# Patient Record
Sex: Male | Born: 1940 | Race: White | Hispanic: No | Smoking: Never smoker
Health system: Southern US, Community
[De-identification: ages and names within clinical notes are randomized; demographics above are authoritative.]

## PROBLEM LIST (undated history)

## (undated) DIAGNOSIS — I10 Essential (primary) hypertension: Secondary | ICD-10-CM

## (undated) HISTORY — PX: PROSTATE SURGERY: SHX751

---

## 2000-12-11 ENCOUNTER — Encounter: Admission: RE | Admit: 2000-12-11 | Discharge: 2000-12-11 | Payer: Self-pay | Admitting: Family Medicine

## 2001-07-03 ENCOUNTER — Encounter: Admission: RE | Admit: 2001-07-03 | Discharge: 2001-07-03 | Payer: Self-pay | Admitting: Family Medicine

## 2008-08-25 ENCOUNTER — Ambulatory Visit (HOSPITAL_COMMUNITY): Admission: RE | Admit: 2008-08-25 | Discharge: 2008-08-25 | Payer: Self-pay | Admitting: Urology

## 2008-10-10 ENCOUNTER — Inpatient Hospital Stay (HOSPITAL_COMMUNITY): Admission: RE | Admit: 2008-10-10 | Discharge: 2008-10-11 | Payer: Self-pay | Admitting: Urology

## 2008-10-10 ENCOUNTER — Encounter (INDEPENDENT_AMBULATORY_CARE_PROVIDER_SITE_OTHER): Payer: Self-pay | Admitting: Urology

## 2011-04-02 NOTE — Discharge Summary (Signed)
NAME:  KIANDRE, SPAGNOLO NO.:  [id number]   MEDICAL RECORD NO.:  [id number]          PATIENT TYPE:  INP   LOCATION:  1426                         FACILITY:  [facility]   PHYSICIAN:  Valetta Fuller, M.D.  DATE OF BIRTH:  [birth year]   DATE OF ADMISSION:  10/10/2008  DATE OF DISCHARGE:  10/11/2008                               DISCHARGE SUMMARY   ADMISSION DIAGNOSIS:  Adenocarcinoma of the prostate.   DISCHARGE DIAGNOSIS:  Adenocarcinoma of the prostate.   PROCEDURES:  1. Robotic assisted laparoscopic radical prostatectomy.  2. Bilateral pelvic lymphadenopathy.   HISTORY OF PRESENT ILLNESS:  For full details, please see admission  history and physical.  Briefly, Mr. Colee is a 70 year old gentleman  who was found to have adenocarcinoma of the prostate.  After careful  consideration regarding management options for treatment, he elected to  proceed with surgical therapy and a robotic assisted laparoscopic  radical prostatectomy.   HOSPITAL COURSE:  On October 10, 2008, he was taken to the operating  room and underwent robotic assisted laparoscopic radical prostatectomy  which he tolerated well and without complications.  Postoperatively, he  was able to be transferred to a regular hospital room following recovery  from anesthesia.  He was able to begin ambulation that evening.  He  remained hemodynamically stable.  On the morning of postoperative day  #1, his hematocrit was 35.6.  Maintained excellent urine output with  minimal output from pelvic drain.  Therefore, his pelvic drain was  removed.  He was placed on a clear liquid diet and continued to  ambulate.  He was reevaluated on the afternoon of postoperative day #1,  his urine out put, blood pressure and pulse all remained stable.  He was  able to tolerate his clear liquid diet without nausea or vomiting.  He  was able to continue to ambulate without difficulty.  Therefore, he was  felt to be stable for  discharge and had met all discharge criteria.   DISPOSITION:  Home.   DISCHARGE MEDICATIONS:  He was instructed to resume his regular home  medications including Lipitor.  He was provided a prescription for  Vicodin to take as needed for pain and told to use Colace as a stool  softener.  He was given a prescription for Cipro to begin two days prior  to return visit, Foley catheter removal.   DISCHARGE INSTRUCTIONS:  He was instructed to be ambulatory but  specifically told to refrain from heavy lifting, strenuous activity or  driving.  He was instructed on routine Foley catheter care and told to  gradually advance his diet over the course of the next few days.   FOLLOWUP:  He will follow up in one week for Foley catheter removal and  to begin staple removal.      Delia Chimes, NP      Valetta Fuller, M.D.  Electronically Signed    MA/MEDQ  D:  10/11/2008  T:  10/11/2008  Job:  [id number]

## 2011-04-05 NOTE — Op Note (Signed)
NAME:  JOSIAH, NIETO NO.:  [id number]   MEDICAL RECORD NO.:  [id number]          PATIENT TYPE:  INP   LOCATION:  1426                         FACILITY:  [facility]   PHYSICIAN:  Valetta Fuller, M.D.  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   PROCEDURE PERFORMED:  Robotic assisted laparoscopic radical retropubic  prostatectomy with bilateral pelvic lymph node dissection.   SURGEON:  Valetta Fuller, M.D.   ASSISTANT:  Delia Chimes, NP.   ANESTHESIA:  General endotracheal.   INDICATIONS:  Mr. Canner is a 70 year old male.  The patient was  initially seen and evaluated by Dr. Larey Dresser.  The patient was  noted to have a PSA of approximately 11.  Rectal exam was unremarkable.  Ultrasound revealed a 47 gram prostate.  The patient's biopsies were  positive in all cores.  He had very high grade cancer with at least one  area showing a Gleason 4+5=9 tumor.  There was quite a bit of  variability with certain areas showing lower grade Gleason 6 cancer.  The patient did undergo metastatic evaluation including bone scan and CT  which failed to reveal gross disease outside his prostate.  He  understands he is at very high risk for microscopic disease outside the  confines of the prostate and/or microscopic metastatic disease.  The  patient underwent extensive discussion with regard to treatment options.  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  After decision was made to strongly consider  surgery, the patient was sent to consultation in our office where we had  an additional consultation with regard to the benefits and risks of  surgery.  Full informed  consent has been obtained.  He presents now for  the definitive procedure.  The patient has received perioperative  antibiotics and also had placement of compression boots for DVT  prophylaxis.   TECHNIQUE AND FINDINGS:  The patient was brought to the operating room  where he had successful induction of general endotracheal anesthesia.  He was placed in mid lithotomy position.  All extremities were carefully  padded.  He was secured to the operative table.  He was then placed in  steep Trendelenburg position and prepped and draped in the usual manner.  Foley catheter was placed sterilely on the field.  The camera port  incision was chosen 18 cm above the pubic symphysis just to the left of  the umbilicus.  Standard open Roseanne Reno technique was utilized with no  evidence of abdominal adhesions and abdominal insufflation occurred  without incident.  Additional trocars were all placed with direct visual  guidance including a 12 and 5 mm assist port and three 8-mm robotic  trocar ports.  Once all trocars were placed, the surgical cart was  docked.  The bladder was filled  to help identify it and the space of  Retzius was developed with sharp and blunt dissecting technique.  The  fat overlying the bladder neck and endopelvic fascia of the prostate was  removed to expose those structures.  Endopelvic fascia was incised from  base to the apex of the prostate and levator musculature was swept off  the apex of the prostate, isolating the dorsal venous complex.  Puboprostatic ligaments were taken down sharply.  The dorsal vein was  stapled with an ETS stapling device with good hemostasis.   Anterior bladder neck was identified with the aid of the Foley balloon.  This was then transected with hot electrocautery scissors.  Foley  catheter was identified the midline and then retracted anteriorly.  Indigo carmine was given and we were well away from the ureteral  orifices.  Posterior bladder neck was  then transected.  Individually we  were able to isolate the vas deferens on both sides as well as the  seminal vesicle and these structures were dissected free.  Once that was  completed, the rectal plane between the prostate and the rectum was  established primarily with blunt dissection.  No attempt was made to do  nerve sparing.  We took down the pedicles of the prostate with  hematoclips bilaterally and then tried to excise as much of the  neurovascular bundle to go with the specimen staying out as lateral as  possible with the clips.   The apex of the prostate was then carefully inspected.  Anterior urethra  was transected.  Foley catheter was identified and posterior urethra was  then transected.  There was some rectourethralis tissue that was  carefully gone through and then the prostate was freed and removed from  the pelvis.  Copious irrigation in the pelvis was performed.  Insufflation of the rectum revealed no evidence of rectal injury and  there was minimal to no significant blood loss at this point.   Attention was then turned towards pelvic lymph node dissection.  This  was extended to include the obturator packets up to the bifurcation of  the iliac artery.  Obturator nerves were identified bilaterally.  Small  clips were used for the veins and lymphatic channels and bilateral  packets were sent separately.   Attention was then turned to reconstruction.  A 2-0 Vicryl suture was  used at the 6 o'clock bladder neck and urethral position to  reapproximate those structures.  Once that was accomplished, we then  used a double-armed 3-0 Monocryl suture to perform a running 360 degrees  anastomosis.  A new coude Foley catheter was placed.  Bladder irrigated  blue dye and again the orifices had been identified with the indigo  carmine.  There was no evidence of leakage on bladder irrigation.  A  drain was placed through one of the robotic trocars and placed in the  retropubic  space and secured to the skin.  The prostate specimen was  placed in the Endopouch collection device.  The 12 mm trocar site was  closed with a suture passer utilizing Vicryl suture.  All other ports  were removed with direct visual guidance.  The camera port incision was  extended slightly to allow for removal of specimen and then closed with  a running #1 Vicryl suture.  All incisions were infiltrated with  Marcaine and closed with clips.  Total blood loss was estimated at 75  mL.  The patient had no other obvious complications or problems and was  brought to recovery room in stable condition.  +      Valetta Fuller, M.D.  Electronically Signed     DSG/MEDQ  D:  10/12/2008  T:  10/12/2008  Job:  [id number]

## 2011-08-20 LAB — BASIC METABOLIC PANEL
BUN: 14
CO2: 28
Chloride: 111
Creatinine, Ser: 0.95
GFR calc non Af Amer: >60
GFR calc non Af Amer: >60
Glucose, Bld: 127 — ABNORMAL HIGH
Potassium: 4.5
Sodium: 144

## 2011-08-20 LAB — DIFFERENTIAL
Eosinophils Relative: 0
Monocytes Relative: 7

## 2011-08-20 LAB — CBC
HCT: 35.6 — ABNORMAL LOW
MCHC: 33.4
Platelets: 152
RBC: 3.75 — ABNORMAL LOW

## 2011-08-20 LAB — HEMOGLOBIN AND HEMATOCRIT, BLOOD: Hemoglobin: 14.9

## 2011-08-20 LAB — TYPE AND SCREEN

## 2012-11-12 ENCOUNTER — Other Ambulatory Visit: Payer: Self-pay | Admitting: *Deleted

## 2012-11-12 DIAGNOSIS — R222 Localized swelling, mass and lump, trunk: Secondary | ICD-10-CM

## 2012-12-08 ENCOUNTER — Other Ambulatory Visit: Payer: Self-pay | Admitting: Dermatology

## 2019-01-12 ENCOUNTER — Ambulatory Visit
Admission: EM | Admit: 2019-01-12 | Discharge: 2019-01-12 | Disposition: A | Discharge status: Home / Self Care | Payer: Medicare Other

## 2019-01-12 DIAGNOSIS — J209 Acute bronchitis, unspecified: Secondary | ICD-10-CM

## 2019-01-12 MED ORDER — ONDANSETRON 4 MG PO TBDP: 4.0000 mg | ORAL_TABLET | Freq: Three times a day (TID) | ORAL | 0 refills | Status: DC | PRN

## 2019-01-12 MED ORDER — PREDNISONE 50 MG PO TABS: 50.0000 mg | ORAL_TABLET | Freq: Every day | ORAL | 0 refills | Status: DC

## 2019-01-12 NOTE — ED Provider Notes (Signed)
[facility]    CSN: [id number] Arrival date & time: 01/12/19  1121     History   Chief Complaint Chief Complaint  Patient presents with  . Cough    HPI Larry Olsen is a 78 y.o. male.   78 year old male comes in for 5 day history of URI symptoms. Has had cough, congestion, body aches. Denies fever, chills, night sweats. States nausea without vomiting. Patient has been taking otc cold medicine with some relief. Never smoker.   Patient did not want to answer additional questions, stating wants amoxicillin for his symptoms. Continued to say "whatever" when I stated will need to do an exam first.      History reviewed. No pertinent past medical history.  There are no active problems to display for this patient.   History reviewed. No pertinent surgical history.     Home Medications    Prior to Admission medications   Medication Sig Start Date End Date Taking? Authorizing Provider  atorvastatin (LIPITOR) 10 MG tablet Take 10 mg by mouth daily.   Yes [provider]  ondansetron (ZOFRAN ODT) 4 MG disintegrating tablet Take 1 tablet (4 mg total) by mouth every 8 (eight) hours as needed for nausea or vomiting. 01/12/19   Cathie Hoops, Layney Gillson V, PA-C  predniSONE (DELTASONE) 50 MG tablet Take 1 tablet (50 mg total) by mouth daily with breakfast. 01/12/19   Belinda Fisher, PA-C    Family History No family history on file.  Social History Social History   Tobacco Use  . Smoking status: Never Smoker  . Smokeless tobacco: Never Used  Substance Use Topics  . Alcohol use: Not Currently  . Drug use: Not on file     Allergies   Patient has no known allergies.   Review of Systems Review of Systems  Reason unable to perform ROS: See HPI as above.     Physical Exam Triage Vital Signs ED Triage Vitals [01/12/19 1243]  Enc Vitals Group     BP 138/76     Pulse Rate 87     Resp 18     Temp (!) 97.3 F (36.3 C)     Temp Source Oral     SpO2 96 %     Weight       Height      Head Circumference      Peak Flow      Pain Score 0     Pain Loc      Pain Edu?      Excl. in GC?    No data found.  Updated Vital Signs BP 138/76 (BP Location: Left Arm)   Pulse 87   Temp (!) 97.3 F (36.3 C) (Oral)   Resp 18   SpO2 96%   Physical Exam Constitutional:      General: He is not in acute distress.    Appearance: He is well-developed. He is not ill-appearing, toxic-appearing or diaphoretic.  HENT:     Head: Normocephalic and atraumatic.     Right Ear: Tympanic membrane, ear canal and external ear normal. Tympanic membrane is not erythematous or bulging.     Left Ear: Tympanic membrane, ear canal and external ear normal. Tympanic membrane is not erythematous or bulging.     Nose: Nose normal.     Right Sinus: No maxillary sinus tenderness or frontal sinus tenderness.     Left Sinus: No maxillary sinus tenderness or frontal sinus tenderness.  Mouth/Throat:     Mouth: Mucous membranes are moist.     Pharynx: Oropharynx is clear. Uvula midline.  Eyes:     Conjunctiva/sclera: Conjunctivae normal.     Pupils: Pupils are equal, round, and reactive to light.  Neck:     Musculoskeletal: Normal range of motion and neck supple.  Cardiovascular:     Rate and Rhythm: Normal rate and regular rhythm.     Heart sounds: Normal heart sounds. No murmur. No friction rub. No gallop.   Pulmonary:     Effort: Pulmonary effort is normal. No accessory muscle usage, prolonged expiration, respiratory distress or retractions.     Breath sounds: Normal breath sounds. No stridor, decreased air movement or transmitted upper airway sounds. No decreased breath sounds, wheezing, rhonchi or rales.  Skin:    General: Skin is warm and dry.  Neurological:     Mental Status: He is alert and oriented to person, place, and time.      UC Treatments / Results  Labs (all labs ordered are listed, but only abnormal results are displayed) Labs Reviewed - No data to  display  EKG None  Radiology No results found.  Procedures Procedures (including critical care time)  Medications Ordered in UC Medications - No data to display  Initial Impression / Assessment and Plan / UC Course  I have reviewed the triage vital signs and the nursing notes.  Pertinent labs & imaging results that were available during my care of the patient were reviewed by me and considered in my medical decision making (see chart for details).    Exam unremarkable. Stable vital signs. Lungs clear to auscultation bilaterally without adventitious lung sounds. No indications of antibiotics at this time. Offered prednisone to help with symptoms. Return precautions given.  Final Clinical Impressions(s) / UC Diagnoses   Final diagnoses:  Acute bronchitis, unspecified organism    ED Prescriptions    Medication Sig Dispense Auth. Provider   predniSONE (DELTASONE) 50 MG tablet Take 1 tablet (50 mg total) by mouth daily with breakfast. 5 tablet Nainika Newlun V, PA-C   ondansetron (ZOFRAN ODT) 4 MG disintegrating tablet Take 1 tablet (4 mg total) by mouth every 8 (eight) hours as needed for nausea or vomiting. 10 tablet Threasa Alpha, [place] 01/12/19 1559

## 2019-01-12 NOTE — Discharge Instructions (Signed)
Prednisone for bronchitis. Zofran as needed for nausea/vomiting. Keep hydrated, your urine should be clear to pale yellow in color. Tylenol/motrin for fever and pain. Monitor for any worsening of symptoms, chest pain, shortness of breath, wheezing, swelling of the throat, follow up for reevaluation.   For sore throat/cough try using a honey-based tea. Use 3 teaspoons of honey with juice squeezed from half lemon. Place shaved pieces of ginger into 1/2-1 cup of water and warm over stove top. Then mix the ingredients and repeat every 4 hours as needed.

## 2019-01-12 NOTE — ED Triage Notes (Signed)
Pt c/o cough, head/chest congestion, body aches since friday

## 2020-09-12 ENCOUNTER — Emergency Department (HOSPITAL_COMMUNITY): Payer: Medicare Other

## 2020-09-12 ENCOUNTER — Encounter (HOSPITAL_COMMUNITY): Payer: Self-pay | Admitting: Internal Medicine

## 2020-09-12 ENCOUNTER — Other Ambulatory Visit: Payer: Self-pay

## 2020-09-12 ENCOUNTER — Inpatient Hospital Stay (HOSPITAL_COMMUNITY)
Admission: EM | Admit: 2020-09-12 | Discharge: 2020-09-24 | DRG: 177 | Disposition: A | Discharge status: Home Health | Payer: Medicare Other | Attending: Internal Medicine | Admitting: Internal Medicine

## 2020-09-12 ENCOUNTER — Telehealth: Payer: Self-pay | Admitting: Nurse Practitioner

## 2020-09-12 DIAGNOSIS — J9601 Acute respiratory failure with hypoxia: Secondary | ICD-10-CM

## 2020-09-12 DIAGNOSIS — E871 Hypo-osmolality and hyponatremia: Secondary | ICD-10-CM

## 2020-09-12 DIAGNOSIS — I248 Other forms of acute ischemic heart disease: Secondary | ICD-10-CM

## 2020-09-12 DIAGNOSIS — Z7189 Other specified counseling: Secondary | ICD-10-CM | POA: Diagnosis not present

## 2020-09-12 DIAGNOSIS — D72828 Other elevated white blood cell count: Secondary | ICD-10-CM | POA: Diagnosis present

## 2020-09-12 DIAGNOSIS — I10 Essential (primary) hypertension: Secondary | ICD-10-CM | POA: Diagnosis present

## 2020-09-12 DIAGNOSIS — E785 Hyperlipidemia, unspecified: Secondary | ICD-10-CM | POA: Diagnosis present

## 2020-09-12 DIAGNOSIS — N179 Acute kidney failure, unspecified: Secondary | ICD-10-CM | POA: Diagnosis present

## 2020-09-12 DIAGNOSIS — Z66 Do not resuscitate: Secondary | ICD-10-CM | POA: Diagnosis not present

## 2020-09-12 DIAGNOSIS — R0602 Shortness of breath: Secondary | ICD-10-CM

## 2020-09-12 DIAGNOSIS — R197 Diarrhea, unspecified: Secondary | ICD-10-CM | POA: Diagnosis present

## 2020-09-12 DIAGNOSIS — U071 COVID-19: Principal | ICD-10-CM

## 2020-09-12 DIAGNOSIS — Z515 Encounter for palliative care: Secondary | ICD-10-CM | POA: Diagnosis not present

## 2020-09-12 DIAGNOSIS — R531 Weakness: Secondary | ICD-10-CM | POA: Diagnosis not present

## 2020-09-12 DIAGNOSIS — T380X5A Adverse effect of glucocorticoids and synthetic analogues, initial encounter: Secondary | ICD-10-CM | POA: Diagnosis present

## 2020-09-12 DIAGNOSIS — J1282 Pneumonia due to coronavirus disease 2019: Secondary | ICD-10-CM | POA: Diagnosis present

## 2020-09-12 DIAGNOSIS — R7989 Other specified abnormal findings of blood chemistry: Secondary | ICD-10-CM | POA: Diagnosis present

## 2020-09-12 DIAGNOSIS — J189 Pneumonia, unspecified organism: Secondary | ICD-10-CM

## 2020-09-12 DIAGNOSIS — I2489 Other forms of acute ischemic heart disease: Secondary | ICD-10-CM

## 2020-09-12 DIAGNOSIS — R54 Age-related physical debility: Secondary | ICD-10-CM | POA: Diagnosis present

## 2020-09-12 HISTORY — DX: Essential (primary) hypertension: I10

## 2020-09-12 LAB — TRIGLYCERIDES: Triglycerides: 58 mg/dL (ref <150)

## 2020-09-12 LAB — CBC WITH DIFFERENTIAL/PLATELET
Abs Immature Granulocytes: 0.09 10*3/uL — ABNORMAL HIGH (ref 0.00–0.07)
Basophils Absolute: 0 10*3/uL (ref 0.0–0.1)
Basophils Relative: 0 %
Eosinophils Absolute: 0 10*3/uL (ref 0.0–0.5)
Eosinophils Relative: 0 %
HCT: 33.8 % — ABNORMAL LOW (ref 39.0–52.0)
Hemoglobin: 12 g/dL — ABNORMAL LOW (ref 13.0–17.0)
Immature Granulocytes: 1 %
Lymphocytes Relative: 1 %
Lymphs Abs: 0.2 10*3/uL — ABNORMAL LOW (ref 0.7–4.0)
MCH: 31.5 pg (ref 26.0–34.0)
MCHC: 35.5 g/dL (ref 30.0–36.0)
MCV: 88.7 fL (ref 80.0–100.0)
Monocytes Absolute: 0.7 10*3/uL (ref 0.1–1.0)
Monocytes Relative: 5 %
Neutro Abs: 14.4 10*3/uL — ABNORMAL HIGH (ref 1.7–7.7)
Neutrophils Relative %: 93 %
Platelets: 207 10*3/uL (ref 150–400)
RBC: 3.81 MIL/uL — ABNORMAL LOW (ref 4.22–5.81)
RDW: 12.6 % (ref 11.5–15.5)
WBC: 15.3 10*3/uL — ABNORMAL HIGH (ref 4.0–10.5)
nRBC: 0 % (ref 0.0–0.2)

## 2020-09-12 LAB — TROPONIN I (HIGH SENSITIVITY)
Troponin I (High Sensitivity): 352 ng/L (ref <18)
Troponin I (High Sensitivity): 531 ng/L (ref <18)
Troponin I (High Sensitivity): 542 ng/L (ref <18)

## 2020-09-12 LAB — RESPIRATORY PANEL BY RT PCR (FLU A&B, COVID)
Influenza A by PCR: NEGATIVE
Influenza B by PCR: NEGATIVE
SARS Coronavirus 2 by RT PCR: POSITIVE — AB

## 2020-09-12 LAB — BASIC METABOLIC PANEL
Anion gap: 10 (ref 5–15)
BUN: 14 mg/dL (ref 8–23)
CO2: 20 mmol/L — ABNORMAL LOW (ref 22–32)
Calcium: 8.2 mg/dL — ABNORMAL LOW (ref 8.9–10.3)
Chloride: 91 mmol/L — ABNORMAL LOW (ref 98–111)
Creatinine, Ser: 1.01 mg/dL (ref 0.61–1.24)
GFR, Estimated: >60 mL/min (ref >60)
Glucose, Bld: 195 mg/dL — ABNORMAL HIGH (ref 70–99)
Potassium: 4.7 mmol/L (ref 3.5–5.1)
Sodium: 121 mmol/L — ABNORMAL LOW (ref 135–145)

## 2020-09-12 LAB — COMPREHENSIVE METABOLIC PANEL
ALT: 52 U/L — ABNORMAL HIGH (ref 0–44)
AST: 87 U/L — ABNORMAL HIGH (ref 15–41)
Albumin: 2.7 g/dL — ABNORMAL LOW (ref 3.5–5.0)
Alkaline Phosphatase: 83 U/L (ref 38–126)
Anion gap: 11 (ref 5–15)
BUN: 17 mg/dL (ref 8–23)
CO2: 20 mmol/L — ABNORMAL LOW (ref 22–32)
Calcium: 8.2 mg/dL — ABNORMAL LOW (ref 8.9–10.3)
Chloride: 89 mmol/L — ABNORMAL LOW (ref 98–111)
Creatinine, Ser: 1.47 mg/dL — ABNORMAL HIGH (ref 0.61–1.24)
GFR, Estimated: 48 mL/min — ABNORMAL LOW (ref >60)
Glucose, Bld: 186 mg/dL — ABNORMAL HIGH (ref 70–99)
Potassium: 4.4 mmol/L (ref 3.5–5.1)
Sodium: 120 mmol/L — ABNORMAL LOW (ref 135–145)
Total Bilirubin: 1.7 mg/dL — ABNORMAL HIGH (ref 0.3–1.2)
Total Protein: 5.8 g/dL — ABNORMAL LOW (ref 6.5–8.1)

## 2020-09-12 LAB — C-REACTIVE PROTEIN: CRP: 15.5 mg/dL — ABNORMAL HIGH (ref <1.0)

## 2020-09-12 LAB — LACTIC ACID, PLASMA
Lactic Acid, Venous: 2.5 mmol/L (ref 0.5–1.9)
Lactic Acid, Venous: 2.5 mmol/L (ref 0.5–1.9)

## 2020-09-12 LAB — FERRITIN: Ferritin: 806 ng/mL — ABNORMAL HIGH (ref 24–336)

## 2020-09-12 LAB — OSMOLALITY: Osmolality: 262 mOsm/kg — ABNORMAL LOW (ref 275–295)

## 2020-09-12 LAB — LACTATE DEHYDROGENASE: LDH: 413 U/L — ABNORMAL HIGH (ref 98–192)

## 2020-09-12 LAB — PROCALCITONIN: Procalcitonin: 0.72 ng/mL

## 2020-09-12 LAB — D-DIMER, QUANTITATIVE: D-Dimer, Quant: 4.45 ug/mL-FEU — ABNORMAL HIGH (ref 0.00–0.50)

## 2020-09-12 LAB — FIBRINOGEN: Fibrinogen: 692 mg/dL — ABNORMAL HIGH (ref 210–475)

## 2020-09-12 LAB — BRAIN NATRIURETIC PEPTIDE: B Natriuretic Peptide: 240.6 pg/mL — ABNORMAL HIGH (ref 0.0–100.0)

## 2020-09-12 MED ORDER — DEXAMETHASONE SODIUM PHOSPHATE 10 MG/ML IJ SOLN: 6.0000 mg | INTRAMUSCULAR | Status: DC

## 2020-09-12 MED ORDER — SODIUM CHLORIDE 0.9 % IV SOLN
200.0000 mg | Freq: Once | INTRAVENOUS | Status: AC
  Administered 2020-09-12: 200 mg via INTRAVENOUS
  Filled 2020-09-12: qty 40

## 2020-09-12 MED ORDER — HYDROCOD POLST-CPM POLST ER 10-8 MG/5ML PO SUER: 5.0000 mL | Freq: Two times a day (BID) | ORAL | Status: DC | PRN

## 2020-09-12 MED ORDER — POLYETHYLENE GLYCOL 3350 17 G PO PACK: 17.0000 g | PACK | Freq: Every day | ORAL | Status: DC | PRN

## 2020-09-12 MED ORDER — TOCILIZUMAB 400 MG/20ML IV SOLN
525.0000 mg | Freq: Once | INTRAVENOUS | Status: AC
  Administered 2020-09-12: 525 mg via INTRAVENOUS
  Filled 2020-09-12: qty 26.25

## 2020-09-12 MED ORDER — SODIUM CHLORIDE 0.9% FLUSH
3.0000 mL | Freq: Two times a day (BID) | INTRAVENOUS | Status: DC
  Administered 2020-09-13 – 2020-09-24 (×23): 3 mL via INTRAVENOUS

## 2020-09-12 MED ORDER — SODIUM CHLORIDE 0.9 % IV SOLN
100.0000 mg | Freq: Every day | INTRAVENOUS | Status: AC
  Administered 2020-09-13 – 2020-09-16 (×4): 100 mg via INTRAVENOUS
  Filled 2020-09-12 (×5): qty 20

## 2020-09-12 MED ORDER — SODIUM CHLORIDE 0.9 % IV SOLN: INTRAVENOUS | Status: AC

## 2020-09-12 MED ORDER — DEXAMETHASONE SODIUM PHOSPHATE 10 MG/ML IJ SOLN
10.0000 mg | Freq: Once | INTRAMUSCULAR | Status: AC
  Administered 2020-09-12: 10 mg via INTRAVENOUS
  Filled 2020-09-12: qty 1

## 2020-09-12 MED ORDER — SODIUM CHLORIDE 0.9 % IV BOLUS
500.0000 mL | Freq: Once | INTRAVENOUS | Status: AC
  Administered 2020-09-12: 500 mL via INTRAVENOUS

## 2020-09-12 MED ORDER — ATORVASTATIN CALCIUM 10 MG PO TABS
10.0000 mg | ORAL_TABLET | Freq: Every day | ORAL | Status: DC
  Administered 2020-09-13 – 2020-09-24 (×12): 10 mg via ORAL
  Filled 2020-09-12 (×12): qty 1

## 2020-09-12 MED ORDER — ENOXAPARIN SODIUM 40 MG/0.4ML ~~LOC~~ SOLN
40.0000 mg | SUBCUTANEOUS | Status: DC
  Administered 2020-09-12: 40 mg via SUBCUTANEOUS
  Filled 2020-09-12: qty 0.4

## 2020-09-12 MED ORDER — GUAIFENESIN-DM 100-10 MG/5ML PO SYRP: 10.0000 mL | ORAL_SOLUTION | ORAL | Status: DC | PRN

## 2020-09-12 MED ORDER — ACETAMINOPHEN 325 MG PO TABS: 650.0000 mg | ORAL_TABLET | Freq: Four times a day (QID) | ORAL | Status: DC | PRN

## 2020-09-12 MED ORDER — IOHEXOL 350 MG/ML SOLN
75.0000 mL | Freq: Once | INTRAVENOUS | Status: AC | PRN
  Administered 2020-09-12: 75 mL via INTRAVENOUS

## 2020-09-12 NOTE — Progress Notes (Signed)
RT placed patient on heated HFNC per MD verbal order.  Patient on 25L/100% Fio2. Patient is tolerating settings well at this time. HR 101, RR 32, SPO2 91%.  Nonrebreather is hanging on O2 flowmeter at bedside if needed. RT will continue to monitor as needed.

## 2020-09-12 NOTE — Progress Notes (Signed)
Na return at 121, only increased by 1. Will start NS at 100cc/hr with goal of </=128 in 24 hours from initial sodium. Continue to follow BMP.

## 2020-09-12 NOTE — ED Triage Notes (Signed)
Have been sick the last 10 days. Tested +COVID Monday. SOB got worst today with SpO2 68% on ra and tachypneic up to 50 respiration per minute,Pt does not use oxygen at home. Other symptoms includes lethargy, cough and diarrhea. Finished Z pack this morning.

## 2020-09-12 NOTE — H&P (Signed)
History and Physical   Larry MontgomeryJerry T Olsen ZOX:[id number] DOB: 07-02-1941 DOA: 09/12/2020  PCP: Patient, No Pcp Per   Patient coming from: Home  Chief Complaint: Dyspnea  HPI: Larry MontgomeryJerry T Olsen is a 79 y.o. male with medical history significant of hypertension, hyperlipidemia who presents with progressive dyspnea in the setting of known Covid positive.  He began to experience symptoms of a sinus infection about 2 weeks ago.  He contacted his PCP who prescribed him a short course of antibiotics and he recommended that he get a Covid test.  He did a home test which was positive.  That Monday he went in to see his PCP and he was noted to have a positive test in the clinic as well.  He continued to have progressively worsening dyspnea and fatigue.  He was looking into getting a monoclonal antibody infusion outpatient.  He was contacted by a provider about his eligibility for the infusion over the phone today who noted that he was profoundly dyspneic and tachypneic over the phone and patient was advised to come straight to the ED via EMS.  He reports fever, cough, shortness of breath, diarrhea.  He denies chest pain, abdominal pain.  He has not received a COVID-19 vaccination.  ED Course: In the ED patient was initially requiring 14 L by nonrebreather to maintain saturations greater than 94%, this progressed to requiring 25 L of high flow.  He has been mildly tachycardic in the low 100s and tachypneic in the 30s to 40s.  Labs were significant for sodium 120, creatinine 1.4 elevated from baseline of less than 1.  Mild LFT elevation with AST 87 ALT 57.  Hemoglobin stable at 12, WBC 15.  Troponin initially 352, 531 on repeat.  BNP of 240.  Lactic acid 2.5 and 2.5 again on repeat.  D-dimer 4.4, procalcitonin 10.72, LDH 413, TG 58, fibrinogen 692, CRP pending, ferritin pending, blood cultures pending.  Respiratory panel again positive for Covid.  Patient received a 100 cc bolus, remdesivir, Decadron in the ED.  Review  of Systems: As per HPI otherwise all other systems reviewed and are negative.  Past Medical History:  Diagnosis Date  . HTN (hypertension)     Past Surgical History:  Procedure Laterality Date  . PROSTATE SURGERY      Social History  reports that he has never smoked. He has never used smokeless tobacco. He reports previous alcohol use. No history on file for drug use.  No Known Allergies  History reviewed. No pertinent family history. Reviewed on admission  Prior to Admission medications   Medication Sig Start Date End Date Taking? Authorizing Provider  atorvastatin (LIPITOR) 10 MG tablet Take 10 mg by mouth daily.    [provider]  ondansetron (ZOFRAN ODT) 4 MG disintegrating tablet Take 1 tablet (4 mg total) by mouth every 8 (eight) hours as needed for nausea or vomiting. 01/12/19   Cathie HoopsYu, Amy V, PA-C  predniSONE (DELTASONE) 50 MG tablet Take 1 tablet (50 mg total) by mouth daily with breakfast. 01/12/19   Belinda FisherYu, Amy V, PA-C    Physical Exam: Vitals:   09/12/20 1515 09/12/20 1530 09/12/20 1945 09/12/20 2000  BP: 134/77 138/72 (!) 157/85 (!) 157/85  Pulse: (!) 101 (!) 102 (!) 101 (!) 101  Resp: (!) 50 (!) 36 (!) 38 (!) 32  Temp:      TempSrc:      SpO2: 94% 95% 92% 91%  Weight:      Height:  Physical Exam Constitutional:      Appearance: Normal appearance.     Comments: Moderate distress  HENT:     Head: Normocephalic and atraumatic.     Mouth/Throat:     Mouth: Mucous membranes are moist.     Pharynx: Oropharynx is clear.  Eyes:     Extraocular Movements: Extraocular movements intact.     Pupils: Pupils are equal, round, and reactive to light.  Cardiovascular:     Rate and Rhythm: Regular rhythm. Tachycardia present.     Pulses: Normal pulses.     Heart sounds: Normal heart sounds.  Pulmonary:     Effort: Respiratory distress (moderate) present.     Breath sounds: Rales (Diffuse) present.     Comments: Tachypnea Abdominal:     General: Bowel  sounds are normal. There is no distension.     Palpations: Abdomen is soft.     Tenderness: There is no abdominal tenderness.  Musculoskeletal:        General: No swelling or deformity.  Skin:    General: Skin is warm and dry.  Neurological:     General: No focal deficit present.     Mental Status: Mental status is at baseline.    Labs on Admission: I have personally reviewed following labs and imaging studies  CBC: Recent Labs  Lab 09/12/20 1341  WBC 15.3*  NEUTROABS 14.4*  HGB 12.0*  HCT 33.8*  MCV 88.7  PLT 207   Basic Metabolic Panel: Recent Labs  Lab 09/12/20 1341  NA 120*  K 4.4  CL 89*  CO2 20*  GLUCOSE 186*  BUN 17  CREATININE 1.47*  CALCIUM 8.2*   GFR: Estimated Creatinine Clearance: 34.3 mL/min (A) (by C-G formula based on SCr of 1.47 mg/dL (H)).  Liver Function Tests: Recent Labs  Lab 09/12/20 1341  AST 87*  ALT 52*  ALKPHOS 83  BILITOT 1.7*  PROT 5.8*  ALBUMIN 2.7*    Urine analysis: No results found for: COLORURINE, APPEARANCEUR, LABSPEC, PHURINE, GLUCOSEU, HGBUR, BILIRUBINUR, KETONESUR, PROTEINUR, UROBILINOGEN, NITRITE, LEUKOCYTESUR  Radiological Exams on Admission: CT Angio Chest PE W and/or Wo Contrast  Result Date: 09/12/2020 CLINICAL DATA:  79 year old male with elevated D-dimer. Concern for pulmonary embolism. EXAM: CT ANGIOGRAPHY CHEST WITH CONTRAST TECHNIQUE: Multidetector CT imaging of the chest was performed using the standard protocol during bolus administration of intravenous contrast. Multiplanar CT image reconstructions and MIPs were obtained to evaluate the vascular anatomy. CONTRAST:  37mL OMNIPAQUE IOHEXOL 350 MG/ML SOLN COMPARISON:  Chest radiograph dated 09/12/2020. FINDINGS: Evaluation of this exam is limited due to respiratory motion artifact. Cardiovascular: There is no cardiomegaly or pericardial effusion. There is 3 vessel coronary vascular calcification. Moderate atherosclerotic calcification of the thoracic aorta. No  aneurysmal dilatation or dissection. The origins of the great vessels of the aortic arch appear patent as visualized. Evaluation of the pulmonary arteries is limited due to respiratory motion artifact as well as streak artifact caused by patient's arms. No large or central pulmonary artery embolus identified. Mediastinum/Nodes: No hilar or mediastinal adenopathy. The esophagus and the thyroid gland are grossly unremarkable. No mediastinal fluid collection. Lungs/Pleura: Extensive patchy bilateral pulmonary opacities with predominant involvement of the lower lobes most consistent with multifocal pneumonia, likely viral or atypical in etiology including COVID-19. Clinical correlation is recommended. There are small bilateral pleural effusions. No pneumothorax. The central airways are patent. Upper Abdomen: No acute abnormality. Musculoskeletal: Degenerative changes of the spine. No acute osseous pathology. Review of the MIP images confirms  the above findings. IMPRESSION: 1. No CT evidence of central pulmonary artery embolus. 2. Multifocal pneumonia, likely viral or atypical in etiology. Clinical correlation and follow-up to resolution recommended. 3. Small bilateral pleural effusions. 4. Aortic Atherosclerosis (ICD10-I70.0). Electronically Signed   By: Elgie Collard M.D.   On: 09/12/2020 19:05   DG Chest Port 1 View  Result Date: 09/12/2020 CLINICAL DATA:  Shortness of breath, COVID-19 positive EXAM: PORTABLE CHEST 1 VIEW COMPARISON:  10/07/2008 FINDINGS: Heart size within normal limits. Aortic atherosclerosis. There are diffuse interstitial opacities throughout the left lung, most confluent within the left lung base. Mild interstitial prominence within the right lung. No large pleural fluid collection. No pneumothorax. IMPRESSION: Diffuse interstitial opacities throughout the left lung, most confluent within the left lung base. Findings concerning for multifocal atypical/viral infection versus asymmetric  edema. Electronically Signed   By: Duanne Guess D.O.   On: 09/12/2020 14:59    EKG: Independently reviewed.  Sinus tachycardia.   Assessment/Plan Principal Problem:   Pneumonia due to COVID-19 virus Active Problems:   Hyponatremia   ARF (acute renal failure) (HCC)   Demand ischemia (HCC)  Pneumonia 2/2 Covid-19 > Known positive for about 2 weeks, progressive symptoms, now requiring 25 L of high flow at 100%. > Likely myocardial injury, with elevated troponin, as below - Admit to progressive - Continue high flow - Continue remdesivir and Decadron - Start Tocilizumab, given already increasing oxygen requirements from 15 L nonrebreather to 25 L high flow - Evaluate for proning - Mucinex PRN - Daily CBC, CMP, CRP, D-dimer, ferritin, magnesium, phosphorus  AKI Hyponatremia > Sodium 120 in ED, received 500 cc bolus > Creatinine mildly elevated to 1.47 - Recheck BMP, determine fluid rate based on results of this - Serum osm, urine osm, ur na - Trend BMP  Troponinemia > Demand ischemia suspected, myocardial injury common in hospitalized COVID-19 patients - Trend troponin to peak  Hypertension > Reports taking blood pressure medicine that starts with an "A", but is unsure what it is - Will hold off on antihypertensive for now (likely Amlodipine)  HLD - Continue atorvastatin  DVT prophylaxis: Lovenox  Code Status:   Full  Family Communication:  Attempted to contact, Daughter,  Maureen Ralphs by phone at [phone]; but number was disconnected. No answer at his phone number either.   Disposition Plan:   Patient is from:  Home  Anticipated DC to:  Pending clinical course  Anticipated DC date:  Pending clinical course  Consults called:  None  Admission status:  Inpatient, progressive   Severity of Illness: The appropriate patient status for this patient is INPATIENT. Inpatient status is judged to be reasonable and necessary in order to provide the required intensity of  service to ensure the patient's safety. The patient's presenting symptoms, physical exam findings, and initial radiographic and laboratory data in the context of their chronic comorbidities is felt to place them at high risk for further clinical deterioration. Furthermore, it is not anticipated that the patient will be medically stable for discharge from the hospital within 2 midnights of admission. The following factors support the patient status of inpatient.   " The patient's presenting symptoms include fatigue, dyspnea. " The worrisome physical exam findings include tachycardia, tachypnea. " The initial radiographic and laboratory data are worrisome because of multifocal pneumonia in the setting of COVID-19 infection, hyponatremia. " The chronic co-morbidities include hypertension, hyperlipidemia.   * I certify that at the point of admission it is my clinical judgment that the  patient will require inpatient hospital care spanning beyond 2 midnights from the point of admission due to high intensity of service, high risk for further deterioration and high frequency of surveillance required.Synetta Fail MD [facility]  How to contact the [facility] Attending or Consulting provider 7A - 7P or covering provider during after hours 7P -7A, for this patient?   1. Check the care team in [facility] and look for a) attending/consulting [facility] provider listed and b) the [facility] team listed 2. Log into [email or web address] and use [place]'s universal password to access. If you do not have the password, please contact the hospital operator. 3. Locate the [facility] provider you are looking for under [facility] and page to a number that you can be directly reached. 4. If you still have difficulty reaching the provider, please page the [facility] (Director on Call) for the Hospitalists listed on amion for assistance.  09/12/2020, 9:14 PM

## 2020-09-12 NOTE — Telephone Encounter (Signed)
I called Larry Olsen to discuss Covid symptoms and the use of casirivimab/imdevimab, a monoclonal antibody infusion for those with mild to moderate Covid symptoms and at a high risk of hospitalization.    Pt does not qualify for infusion therapy as pt's symptoms first presented > 10 days prior to timing of infusion. Pt is profoundly dyspneic with speech.  Tachypnea noted during phone call.  I advised pt that he sounds to have severe illness and should hang up and immediately call 911.  Patient verbalized understanding  Nicolasa Ducking, NP

## 2020-09-12 NOTE — ED Provider Notes (Signed)
  Physical Exam  BP 138/72   Pulse (!) 102   Temp 100 F (37.8 C) (Oral)   Resp (!) 36   Ht 5\' 5"  (1.651 m)   Wt 65.8 kg   SpO2 95%   BMI 24.13 kg/m   Physical Exam  ED Course/Procedures     Procedures  MDM    Received care of patient from Dr. at 4 PM.  Please see his note for prior history, physical and care.  Briefly this 79 year old male who presents with COVID-19 infection with hypoxia down to the 60s, labs significant for acute kidney injury, troponin elevation and hyponatremia.  CT PE study pending.   CT PE study shows no evidence of pulmonary embolus, does show findings consistent with COVID-19 infection.  Dr. 94 had spoken to cardiology previously about troponin elevation.  We will transition patient to high flow nasal cannula from nonrebreather.  Admit to the hospital for further care.  He has received Decadron.    Stevie Kern, MD 09/13/20 1147

## 2020-09-12 NOTE — ED Provider Notes (Signed)
[facility] EMERGENCY DEPARTMENT Provider Note   CSN: [id number] Arrival date & time: 09/12/20  1336     History Chief Complaint  Patient presents with  . Shortness of Breath  . Covid Positive    Larry Olsen is a 79 y.o. male.  Presents to ER with concern for shortness of breath, positive Covid test.  Patient reports he has been having symptoms for the last couple weeks.  Had at home test that was positive, been test in his primary clinic that was positive.  Reports over the last few days he has had significant worsening in his breathing, feels very short of breath today.  Completed Z-Pak.  No improvement in symptoms.  No chest pain currently.  Some low-grade fevers, chills, significant decrease in energy.  HPI     No past medical history on file.  There are no problems to display for this patient.   No past surgical history on file.     No family history on file.  Social History   Tobacco Use  . Smoking status: Never Smoker  . Smokeless tobacco: Never Used  Substance Use Topics  . Alcohol use: Not Currently  . Drug use: Not on file    Home Medications Prior to Admission medications   Medication Sig Start Date End Date Taking? Authorizing Provider  atorvastatin (LIPITOR) 10 MG tablet Take 10 mg by mouth daily.    [provider]  ondansetron (ZOFRAN ODT) 4 MG disintegrating tablet Take 1 tablet (4 mg total) by mouth every 8 (eight) hours as needed for nausea or vomiting. 01/12/19   Cathie Hoops, Amy V, PA-C  predniSONE (DELTASONE) 50 MG tablet Take 1 tablet (50 mg total) by mouth daily with breakfast. 01/12/19   Belinda Fisher, PA-C    Allergies    Patient has no known allergies.  Review of Systems   Review of Systems  Constitutional: Positive for chills, fatigue and fever.  HENT: Negative for ear pain and sore throat.   Eyes: Negative for pain and visual disturbance.  Respiratory: Positive for shortness of breath. Negative for cough.     Cardiovascular: Negative for chest pain and palpitations.  Gastrointestinal: Negative for abdominal pain and vomiting.  Genitourinary: Negative for dysuria and hematuria.  Musculoskeletal: Negative for arthralgias and back pain.  Skin: Negative for color change and rash.  Neurological: Negative for seizures and syncope.  All other systems reviewed and are negative.   Physical Exam Updated Vital Signs BP 138/72   Pulse (!) 102   Temp 100 F (37.8 C) (Oral)   Resp (!) 36   Ht 5\' 5"  (1.651 m)   Wt 65.8 kg   SpO2 95%   BMI 24.13 kg/m   Physical Exam Vitals and nursing note reviewed.  Constitutional:      Appearance: He is well-developed.  HENT:     Head: Normocephalic and atraumatic.  Eyes:     Conjunctiva/sclera: Conjunctivae normal.  Cardiovascular:     Rate and Rhythm: Normal rate and regular rhythm.     Heart sounds: No murmur heard.   Pulmonary:     Comments: Mild tachypnea, speaking in near full sentences but slightly reduced, bilateral crackles noted, 15 L NRB Abdominal:     Palpations: Abdomen is soft.     Tenderness: There is no abdominal tenderness.  Musculoskeletal:     Cervical back: Neck supple.     Right lower leg: No edema.     Left lower leg: No  edema.  Skin:    General: Skin is warm and dry.  Neurological:     General: No focal deficit present.     Mental Status: He is alert.  Psychiatric:        Mood and Affect: Mood normal.        Behavior: Behavior normal.     ED Results / Procedures / Treatments   Labs (all labs ordered are listed, but only abnormal results are displayed) Labs Reviewed  LACTIC ACID, PLASMA - Abnormal; Notable for the following components:      Result Value   Lactic Acid, Venous 2.5 (*)    All other components within normal limits  LACTIC ACID, PLASMA - Abnormal; Notable for the following components:   Lactic Acid, Venous 2.5 (*)    All other components within normal limits  CBC WITH DIFFERENTIAL/PLATELET - Abnormal;  Notable for the following components:   WBC 15.3 (*)    RBC 3.81 (*)    Hemoglobin 12.0 (*)    HCT 33.8 (*)    Neutro Abs 14.4 (*)    Lymphs Abs 0.2 (*)    Abs Immature Granulocytes 0.09 (*)    All other components within normal limits  COMPREHENSIVE METABOLIC PANEL - Abnormal; Notable for the following components:   Sodium 120 (*)    Chloride 89 (*)    CO2 20 (*)    Glucose, Bld 186 (*)    Creatinine, Ser 1.47 (*)    Calcium 8.2 (*)    Total Protein 5.8 (*)    Albumin 2.7 (*)    AST 87 (*)    ALT 52 (*)    Total Bilirubin 1.7 (*)    GFR, Estimated 48 (*)    All other components within normal limits  D-DIMER, QUANTITATIVE (NOT AT [facility]) - Abnormal; Notable for the following components:   D-Dimer, Quant 4.45 (*)    All other components within normal limits  LACTATE DEHYDROGENASE - Abnormal; Notable for the following components:   LDH 413 (*)    All other components within normal limits  FIBRINOGEN - Abnormal; Notable for the following components:   Fibrinogen 692 (*)    All other components within normal limits  BRAIN NATRIURETIC PEPTIDE - Abnormal; Notable for the following components:   B Natriuretic Peptide 240.6 (*)    All other components within normal limits  TROPONIN I (HIGH SENSITIVITY) - Abnormal; Notable for the following components:   Troponin I (High Sensitivity) 352 (*)    All other components within normal limits  TROPONIN I (HIGH SENSITIVITY) - Abnormal; Notable for the following components:   Troponin I (High Sensitivity) 531 (*)    All other components within normal limits  RESPIRATORY PANEL BY RT PCR (FLU A&B, COVID)  CULTURE, BLOOD (ROUTINE X 2)  CULTURE, BLOOD (ROUTINE X 2)  PROCALCITONIN  TRIGLYCERIDES  FERRITIN  C-REACTIVE PROTEIN    EKG EKG Interpretation  Date/Time:  Tuesday September 12 2020 13:48:23 EDT Ventricular Rate:  111 PR Interval:    QRS Duration: 81 QT Interval:  321 QTC Calculation: 437 R Axis:   49 Text Interpretation: Sinus  tachycardia Probable left atrial enlargement Confirmed by Marianna Fuss ([id number]) on 09/12/2020 2:21:46 PM   Radiology DG Chest Port 1 View  Result Date: 09/12/2020 CLINICAL DATA:  Shortness of breath, COVID-19 positive EXAM: PORTABLE CHEST 1 VIEW COMPARISON:  10/07/2008 FINDINGS: Heart size within normal limits. Aortic atherosclerosis. There are diffuse interstitial opacities throughout the left lung, most confluent within  the left lung base. Mild interstitial prominence within the right lung. No large pleural fluid collection. No pneumothorax. IMPRESSION: Diffuse interstitial opacities throughout the left lung, most confluent within the left lung base. Findings concerning for multifocal atypical/viral infection versus asymmetric edema. Electronically Signed   By: Duanne Guess D.O.   On: 09/12/2020 14:59    Procedures .Critical Care Performed by: Milagros Loll, MD Authorized by: Milagros Loll, MD   Critical care provider statement:    Critical care time (minutes):  46   Critical care was necessary to treat or prevent imminent or life-threatening deterioration of the following conditions:  Respiratory failure   Critical care was time spent personally by me on the following activities:  Discussions with consultants, evaluation of patient's response to treatment, examination of patient, ordering and performing treatments and interventions, ordering and review of laboratory studies, ordering and review of radiographic studies, pulse oximetry, re-evaluation of patient's condition, obtaining history from patient or surrogate and review of old charts   (including critical care time)  Medications Ordered in ED Medications  remdesivir 200 mg in sodium chloride 0.9% 250 mL IVPB (has no administration in time range)    Followed by  remdesivir 100 mg in sodium chloride 0.9 % 100 mL IVPB (has no administration in time range)  dexamethasone (DECADRON) injection 10 mg (10 mg Intravenous  Given 09/12/20 1518)  sodium chloride 0.9 % bolus 500 mL (500 mLs Intravenous New Bag/Given 09/12/20 1633)    ED Course  I have reviewed the triage vital signs and the nursing notes.  Pertinent labs & imaging results that were available during my care of the patient were reviewed by me and considered in my medical decision making (see chart for details).    MDM Rules/Calculators/A&P                         79 year old male presenting to ER with concern for shortness of breath in setting of known COVID-19.  Reportedly profoundly hypoxic prior to arrival.  Currently oxygenation doing okay on 15 L nonrebreather.  Lab work notable for hyponatremia, elevated troponin, elevated D-dimer.  EKG without clear ischemic changes.  Discussed with cardiology, agreed with obtaining CTA chest to rule out PE, does not need formal consult unless desired by admitting hospitalist, likely troponin secondary to respiratory failure.  Provided patient with some fluids for dehydration, hyponatremia.  Will defer further hyponatremia management to admitting team.  Placed orders for Decadron, remdesivir.  While awaiting results of CTA, status Dr. Dalene Seltzer.  Anticipate medicine admission.  Final Clinical Impression(s) / ED Diagnoses Final diagnoses:  COVID-19  Acute respiratory failure with hypoxia (HCC)  Multifocal pneumonia    Rx / DC Orders ED Discharge Orders    None       Milagros Loll, MD 09/12/20 1655

## 2020-09-13 ENCOUNTER — Inpatient Hospital Stay (HOSPITAL_COMMUNITY): Payer: Medicare Other

## 2020-09-13 DIAGNOSIS — J9601 Acute respiratory failure with hypoxia: Secondary | ICD-10-CM

## 2020-09-13 DIAGNOSIS — U071 COVID-19: Secondary | ICD-10-CM

## 2020-09-13 DIAGNOSIS — R7989 Other specified abnormal findings of blood chemistry: Secondary | ICD-10-CM

## 2020-09-13 DIAGNOSIS — N179 Acute kidney failure, unspecified: Secondary | ICD-10-CM

## 2020-09-13 DIAGNOSIS — E871 Hypo-osmolality and hyponatremia: Secondary | ICD-10-CM

## 2020-09-13 DIAGNOSIS — I248 Other forms of acute ischemic heart disease: Secondary | ICD-10-CM | POA: Diagnosis not present

## 2020-09-13 LAB — BASIC METABOLIC PANEL
Anion gap: 12 (ref 5–15)
Anion gap: 13 (ref 5–15)
BUN: 15 mg/dL (ref 8–23)
BUN: 24 mg/dL — ABNORMAL HIGH (ref 8–23)
CO2: 20 mmol/L — ABNORMAL LOW (ref 22–32)
CO2: 20 mmol/L — ABNORMAL LOW (ref 22–32)
Calcium: 8.5 mg/dL — ABNORMAL LOW (ref 8.9–10.3)
Calcium: 9.1 mg/dL (ref 8.9–10.3)
Chloride: 94 mmol/L — ABNORMAL LOW (ref 98–111)
Chloride: 92 mmol/L — ABNORMAL LOW (ref 98–111)
Creatinine, Ser: 0.9 mg/dL (ref 0.61–1.24)
Creatinine, Ser: 1.34 mg/dL — ABNORMAL HIGH (ref 0.61–1.24)
GFR, Estimated: >60 mL/min (ref >60)
GFR, Estimated: 53 mL/min — ABNORMAL LOW (ref >60)
Glucose, Bld: 142 mg/dL — ABNORMAL HIGH (ref 70–99)
Glucose, Bld: 213 mg/dL — ABNORMAL HIGH (ref 70–99)
Potassium: 4.2 mmol/L (ref 3.5–5.1)
Potassium: 4.1 mmol/L (ref 3.5–5.1)
Sodium: 126 mmol/L — ABNORMAL LOW (ref 135–145)
Sodium: 125 mmol/L — ABNORMAL LOW (ref 135–145)

## 2020-09-13 LAB — COMPREHENSIVE METABOLIC PANEL
ALT: 48 U/L — ABNORMAL HIGH (ref 0–44)
AST: 80 U/L — ABNORMAL HIGH (ref 15–41)
Albumin: 2.5 g/dL — ABNORMAL LOW (ref 3.5–5.0)
Alkaline Phosphatase: 88 U/L (ref 38–126)
Anion gap: 10 (ref 5–15)
BUN: 13 mg/dL (ref 8–23)
CO2: 20 mmol/L — ABNORMAL LOW (ref 22–32)
Calcium: 8.3 mg/dL — ABNORMAL LOW (ref 8.9–10.3)
Chloride: 94 mmol/L — ABNORMAL LOW (ref 98–111)
Creatinine, Ser: 0.96 mg/dL (ref 0.61–1.24)
GFR, Estimated: >60 mL/min (ref >60)
Glucose, Bld: 174 mg/dL — ABNORMAL HIGH (ref 70–99)
Potassium: 4.3 mmol/L (ref 3.5–5.1)
Sodium: 124 mmol/L — ABNORMAL LOW (ref 135–145)
Total Bilirubin: 1.4 mg/dL — ABNORMAL HIGH (ref 0.3–1.2)
Total Protein: 5.7 g/dL — ABNORMAL LOW (ref 6.5–8.1)

## 2020-09-13 LAB — CBC WITH DIFFERENTIAL/PLATELET
Abs Immature Granulocytes: 0.08 10*3/uL — ABNORMAL HIGH (ref 0.00–0.07)
Basophils Absolute: 0 10*3/uL (ref 0.0–0.1)
Basophils Relative: 0 %
Eosinophils Absolute: 0 10*3/uL (ref 0.0–0.5)
Eosinophils Relative: 0 %
HCT: 35.6 % — ABNORMAL LOW (ref 39.0–52.0)
Hemoglobin: 12.7 g/dL — ABNORMAL LOW (ref 13.0–17.0)
Immature Granulocytes: 1 %
Lymphocytes Relative: 2 %
Lymphs Abs: 0.2 10*3/uL — ABNORMAL LOW (ref 0.7–4.0)
MCH: 31 pg (ref 26.0–34.0)
MCHC: 35.7 g/dL (ref 30.0–36.0)
MCV: 86.8 fL (ref 80.0–100.0)
Monocytes Absolute: 0.5 10*3/uL (ref 0.1–1.0)
Monocytes Relative: 4 %
Neutro Abs: 13.2 10*3/uL — ABNORMAL HIGH (ref 1.7–7.7)
Neutrophils Relative %: 93 %
Platelets: 211 10*3/uL (ref 150–400)
RBC: 4.1 MIL/uL — ABNORMAL LOW (ref 4.22–5.81)
RDW: 12.6 % (ref 11.5–15.5)
WBC: 14 10*3/uL — ABNORMAL HIGH (ref 4.0–10.5)
nRBC: 0 % (ref 0.0–0.2)

## 2020-09-13 LAB — D-DIMER, QUANTITATIVE: D-Dimer, Quant: 4.74 ug/mL-FEU — ABNORMAL HIGH (ref 0.00–0.50)

## 2020-09-13 LAB — MAGNESIUM: Magnesium: 1.9 mg/dL (ref 1.7–2.4)

## 2020-09-13 LAB — TROPONIN I (HIGH SENSITIVITY): Troponin I (High Sensitivity): 474 ng/L (ref <18)

## 2020-09-13 LAB — OSMOLALITY, URINE: Osmolality, Ur: 510 mOsm/kg (ref 300–900)

## 2020-09-13 LAB — PHOSPHORUS: Phosphorus: 2.7 mg/dL (ref 2.5–4.6)

## 2020-09-13 LAB — C-REACTIVE PROTEIN: CRP: 16.8 mg/dL — ABNORMAL HIGH (ref <1.0)

## 2020-09-13 LAB — FERRITIN: Ferritin: 743 ng/mL — ABNORMAL HIGH (ref 24–336)

## 2020-09-13 LAB — SODIUM, URINE, RANDOM: Sodium, Ur: 59 mmol/L

## 2020-09-13 MED ORDER — OXYMETAZOLINE HCL 0.05 % NA SOLN
3.0000 | Freq: Two times a day (BID) | NASAL | Status: DC | PRN
  Filled 2020-09-13: qty 30

## 2020-09-13 MED ORDER — METHYLPREDNISOLONE SODIUM SUCC 125 MG IJ SOLR
60.0000 mg | Freq: Two times a day (BID) | INTRAMUSCULAR | Status: DC
  Administered 2020-09-13 – 2020-09-18 (×12): 60 mg via INTRAVENOUS
  Filled 2020-09-13 (×12): qty 2

## 2020-09-13 MED ORDER — MAGNESIUM SULFATE 2 GM/50ML IV SOLN
2.0000 g | Freq: Once | INTRAVENOUS | Status: AC
  Administered 2020-09-13: 2 g via INTRAVENOUS
  Filled 2020-09-13: qty 50

## 2020-09-13 MED ORDER — SALINE SPRAY 0.65 % NA SOLN
1.0000 | NASAL | Status: DC | PRN
  Administered 2020-09-15: 1 via NASAL
  Filled 2020-09-13 (×2): qty 44

## 2020-09-13 MED ORDER — ONDANSETRON HCL 4 MG/2ML IJ SOLN: 4.0000 mg | Freq: Four times a day (QID) | INTRAMUSCULAR | Status: DC | PRN

## 2020-09-13 MED ORDER — ALBUTEROL SULFATE HFA 108 (90 BASE) MCG/ACT IN AERS
2.0000 | INHALATION_SPRAY | RESPIRATORY_TRACT | Status: DC | PRN
  Filled 2020-09-13: qty 6.7

## 2020-09-13 MED ORDER — HYDROCOD POLST-CPM POLST ER 10-8 MG/5ML PO SUER: 5.0000 mL | Freq: Two times a day (BID) | ORAL | Status: DC | PRN

## 2020-09-13 MED ORDER — PANTOPRAZOLE SODIUM 40 MG PO TBEC
40.0000 mg | DELAYED_RELEASE_TABLET | Freq: Every day | ORAL | Status: DC
  Administered 2020-09-13 – 2020-09-24 (×12): 40 mg via ORAL
  Filled 2020-09-13 (×14): qty 1

## 2020-09-13 MED ORDER — ENOXAPARIN SODIUM 40 MG/0.4ML ~~LOC~~ SOLN
40.0000 mg | Freq: Two times a day (BID) | SUBCUTANEOUS | Status: DC
  Administered 2020-09-13 – 2020-09-21 (×17): 40 mg via SUBCUTANEOUS
  Filled 2020-09-13 (×17): qty 0.4

## 2020-09-13 NOTE — Progress Notes (Signed)
Initial Nutrition Assessment  RD working remotely.  DOCUMENTATION CODES:   Not applicable  INTERVENTION:   - Ensure Enlive po BID, each supplement provides 350 kcal and 20 grams of protein  - Recommend liberalizing diet to Regular  NUTRITION DIAGNOSIS:   Increased nutrient needs related to acute illness (COVID-19 pneumonia) as evidenced by estimated needs.  GOAL:   Patient will meet greater than or equal to 90% of their needs  MONITOR:   PO intake, Supplement acceptance, Labs, Weight trends  REASON FOR ASSESSMENT:   Malnutrition Screening Tool    ASSESSMENT:   79 year old male who presented to the ED on 10/26 with SOB related to COVID-19. PMH of HTN, HLD. Pt admitted with COVID-19 pneumonia.   RD was unable to reach pt via phone call to room.  Pt currently on a Heart Healthy diet with PO intake at breakfast this morning recorded as 75%. RD will order oral nutrition supplements to aid pt in meeting increased nutritional needs related to acute illness.  Weight of 145 lbs on admission appears stated rather than measured. No weight history available in chart.  Per review of MD note, pt would not want to be intubated. Plan is to treat pt with steroids/remdesivir and supportive care at this time.  Medications reviewed and include: solu-medrol, protonix, remdesivir  Labs reviewed: sodium 126, elevated LFTs  NUTRITION - FOCUSED PHYSICAL EXAM:  Unable to complete at this time. RD working remotely.  Diet Order:   Diet Order            Diet Heart Room service appropriate? Yes; Fluid consistency: Thin  Diet effective now                 EDUCATION NEEDS:   No education needs have been identified at this time  Skin:  Skin Assessment: Reviewed RN Assessment  Last BM:  09/12/20  Height:   Ht Readings from Last 1 Encounters:  09/12/20 5\' 5"  (1.651 m)    Weight:   Wt Readings from Last 1 Encounters:  09/12/20 65.8 kg    Ideal Body Weight:  61.8  kg  BMI:  Body mass index is 24.13 kg/m.  Estimated Nutritional Needs:   Kcal:  1800-2000  Protein:  85-100 grams  Fluid:  >/= 1.8 L    09/14/20, MS, RD, LDN Inpatient Clinical Dietitian Please see AMiON for contact information.

## 2020-09-13 NOTE — Progress Notes (Signed)
PROGRESS NOTE                                                                                                                                                                                                             Patient Demographics:    Larry Olsen, is a 79 y.o. male, DOB - [birth year], ZOX:[id number]  Outpatient Primary MD for the patient is Patient, No Pcp Per   Admit date - 09/12/2020   LOS - 1  Chief Complaint  Patient presents with  . Shortness of Breath  . Covid Positive       Brief Narrative: Patient is a 79 y.o. male with PMHx of HTN, HLD-known Covid 19+ approximately 2 weeks prior to this hospital stay-presenting with severe shortness of breath-found to have severe acute hypoxemic respiratory failure requiring 15 L of HFNC in the setting of COVID-19 pneumonia.  See below for further details  COVID-19 vaccinated status: Unvaccinated  Significant Events: 10/26>> Admit to [facility] for severe hypoxia due to COVID-19 pneumonia, AKI and hyponatremia.  Significant studies: 10/26>>Chest x-ray: Diffuse interstitial airspace opacities 10/26>> CTA chest: No PE, multifocal pneumonia  COVID-19 medications: Steroids: 10/26>> Remdesivir: 10/26>> Actemra: 10/26 x 1  Antibiotics: None  Microbiology data: 10/26 >>blood culture: No growth  Procedures: None  Consults: None  DVT prophylaxis: Prophylactic Lovenox at intermediate dosing   Subjective:    Geoffry Paradise today remains on heated high flow-gets short of breath with minimal activity-but appears comfortable at rest.   Assessment  & Plan :   Acute Hypoxic Resp Failure due to Covid 19 Viral pneumonia: Continues to have severe hypoxemia-remains tenuous but stable on heated high flow.  Change Decadron to IV Solu-Medrol-continue Remdesivir-monitor closely-if he deteriorates significantly-he will require transfer to the ICU.  No signs of volume  overload-does not require diuretics.  Fever: afebrile O2 requirements:  SpO2: 90 % O2 Flow Rate (L/min): 30 L/min FiO2 (%): 100 %   COVID-19 Labs: Recent Labs    09/12/20 1341 09/13/20 0121  DDIMER 4.45* 4.74*  FERRITIN 806* 743*  LDH 413*  --   CRP 15.5* 16.8*       Component Value Date/Time   BNP 240.6 (H) 09/12/2020 1342    Recent Labs  Lab 09/12/20 1341  PROCALCITON 0.72    Lab Results  Component Value Date   SARSCOV2NAA POSITIVE (  A) 09/12/2020    Prone/Incentive Spirometry: encouraged patient to lie prone for 3-4 hours at a time for a total of 16 hours a day, and to encourage incentive spirometry use 3-4/hour.  Elevated D-dimer: Likely secondary to COVID-19-CTA chest negative for PE-await Doppler of lower extremities.  On intermediate dosing of prophylactic Lovenox.  AKI: Likely hemodynamically mediated-resolved.  Hyponatremia: Secondary to hypovolemia/dehydration-improved with supportive care/IV fluid-stop IV fluids-encourage oral intake.  Transaminitis: Secondary to COVID-19-appears mild-stable for close monitoring/follow-up.  Troponin elevation: Trend is flat-not consistent with ACS-likely demand ischemia due to severe hypoxemia/AKI-check echo.  HTN: BP slowly creeping up-continue to hold valsartan due to AKI on presentation-start low-dose amlodipine while hospitalized.  Follow and optimize.  HLD: Cautiously continue with statin-watch LFTs.  Palliative care discussion: Long discussion with patient at bedside-and then with his daughter Maureen Ralphs over the phone.  Patient/family aware of tenuous clinical situation-severe hypoxemia with potential to decline further.  Patient-clear to me that in case that he were to worsen-he does not want to be intubated and go on a ventilator-I subsequently called his daughter after my Enid Cutter has had a chance to discuss with the patient-and reconfirms patient's desire regarding no intubation.  DNR ordered.  Plan is to continue  to treat him with steroids/remdesivir and supportive care-and watch closely-however if he were to worsen significantly-he will require transition to comfort measures.   GI prophylaxis: PPI  ABG: No results found for: PHART, PCO2ART, PO2ART, HCO3, TCO2, ACIDBASEDEF, O2SAT  Vent Settings: N/A FiO2 (%):  [100 %] 100 %  Condition - Extremely Guarded  Family Communication  :  Spouse Maureen Ralphs [phone]) updated over the phone on 10/27  Code Status:DNR  Diet :  Diet Order            Diet Heart Room service appropriate? Yes; Fluid consistency: Thin  Diet effective now                  Disposition Plan  :   Status is: Inpatient  Remains inpatient appropriate because:Inpatient level of care appropriate due to severity of illness   Dispo: The patient is from: Home              Anticipated d/c is to: TBD              Anticipated d/c date is: > 3 days              Patient currently is not medically stable to d/c.    Barriers to discharge: Hypoxia requiring O2 supplementation/complete 5 days of IV Remdesivir  Antimicorbials  :    Anti-infectives (From admission, onward)   Start     Dose/Rate Route Frequency Ordered Stop   09/13/20 1000  remdesivir 100 mg in sodium chloride 0.9 % 100 mL IVPB       "Followed by" Linked Group Details   100 mg 200 mL/hr over 30 Minutes Intravenous Daily 09/12/20 1509 09/17/20 0959   09/12/20 1630  remdesivir 200 mg in sodium chloride 0.9% 250 mL IVPB       "Followed by" Linked Group Details   200 mg 580 mL/hr over 30 Minutes Intravenous Once 09/12/20 1509 09/13/20 0915      Inpatient Medications  Scheduled Meds: . atorvastatin  10 mg Oral Daily  . enoxaparin (LOVENOX) injection  40 mg Subcutaneous Q12H  . methylPREDNISolone (SOLU-MEDROL) injection  60 mg Intravenous Q12H  . sodium chloride flush  3 mL Intravenous Q12H   Continuous Infusions: . sodium chloride 10 mL/hr  at 09/13/20 1059  . remdesivir 100 mg in NS 100 mL 100 mg (09/13/20  0912)   PRN Meds:.acetaminophen, guaiFENesin-dextromethorphan, polyethylene glycol   Time Spent in minutes  35   See all Orders from today for further details   Jeoffrey Massed M.D on 09/13/2020 at 12:06 PM  To page go to [email or web address] - use universal password  [facility] -  Office  [phone]    Objective:   Vitals:   09/13/20 0858 09/13/20 0920 09/13/20 0952 09/13/20 1022  BP:    (!) 153/82  Pulse: (!) 105 99  99  Resp:    17  Temp:    98 F (36.7 C)  TempSrc:    Oral  SpO2: 90% 92% 90% 90%  Weight:      Height:        Wt Readings from Last 3 Encounters:  09/12/20 65.8 kg     Intake/Output Summary (Last 24 hours) at 09/13/2020 1206 Last data filed at 09/13/2020 1013 Gross per 24 hour  Intake 2394.84 ml  Output --  Net 2394.84 ml     Physical Exam Gen Exam:Alert awake-not in any distress HEENT:atraumatic, normocephalic Chest: B/L clear to auscultation anteriorly CVS:S1S2 regular Abdomen:soft non tender, non distended Extremities:no edema Neurology: Non focal Skin: no rash   Data Review:    CBC Recent Labs  Lab 09/12/20 1341 09/13/20 0121  WBC 15.3* 14.0*  HGB 12.0* 12.7*  HCT 33.8* 35.6*  PLT 207 211  MCV 88.7 86.8  MCH 31.5 31.0  MCHC 35.5 35.7  RDW 12.6 12.6  LYMPHSABS 0.2* 0.2*  MONOABS 0.7 0.5  EOSABS 0.0 0.0  BASOSABS 0.0 0.0    Chemistries  Recent Labs  Lab 09/12/20 1341 09/12/20 2115 09/13/20 0121 09/13/20 0745  NA 120* 121* 124* 126*  K 4.4 4.7 4.3 4.2  CL 89* 91* 94* 94*  CO2 20* 20* 20* 20*  GLUCOSE 186* 195* 174* 142*  BUN 17 14 13 15   CREATININE 1.47* 1.01 0.96 0.90  CALCIUM 8.2* 8.2* 8.3* 8.5*  MG  --   --  1.9  --   AST 87*  --  80*  --   ALT 52*  --  48*  --   ALKPHOS 83  --  88  --   BILITOT 1.7*  --  1.4*  --    ------------------------------------------------------------------------------------------------------------------ Recent Labs    09/12/20 1341  TRIG 58    No results found  for: HGBA1C ------------------------------------------------------------------------------------------------------------------ No results for input(s): TSH, T4TOTAL, T3FREE, THYROIDAB in the last 72 hours.  Invalid input(s): FREET3 ------------------------------------------------------------------------------------------------------------------ Recent Labs    09/12/20 1341 09/13/20 0121  FERRITIN 806* 743*    Coagulation profile No results for input(s): INR, PROTIME in the last 168 hours.  Recent Labs    09/12/20 1341 09/13/20 0121  DDIMER 4.45* 4.74*    Cardiac Enzymes No results for input(s): CKMB, TROPONINI, MYOGLOBIN in the last 168 hours.  Invalid input(s): CK ------------------------------------------------------------------------------------------------------------------    Component Value Date/Time   BNP 240.6 (H) 09/12/2020 1342    Micro Results Recent Results (from the past 240 hour(s))  Blood Culture (routine x 2)     Status: None (Preliminary result)   Collection Time: 09/12/20  2:44 PM   Specimen: BLOOD LEFT ARM  Result Value Ref Range Status   Specimen Description BLOOD LEFT ARM  Final   Special Requests   Final    BOTTLES DRAWN AEROBIC AND ANAEROBIC Blood Culture results may not be optimal due to  an inadequate volume of blood received in culture bottles   Culture   Final    NO GROWTH < 24 HOURS Performed at [facility], [address] [place], [place] [address]    Report Status PENDING  Incomplete  Blood Culture (routine x 2)     Status: None (Preliminary result)   Collection Time: 09/12/20  3:00 PM   Specimen: BLOOD RIGHT ARM  Result Value Ref Range Status   Specimen Description BLOOD RIGHT ARM  Final   Special Requests   Final    BOTTLES DRAWN AEROBIC AND ANAEROBIC Blood Culture adequate volume   Culture   Final    NO GROWTH < 24 HOURS Performed at [facility], [address] [place], [place] [address]    Report Status PENDING   Incomplete  Respiratory Panel by RT PCR (Flu A&B, Covid) - Nasopharyngeal Swab     Status: Abnormal   Collection Time: 09/12/20  3:08 PM   Specimen: Nasopharyngeal Swab  Result Value Ref Range Status   SARS Coronavirus 2 by RT PCR POSITIVE (A) NEGATIVE Final    Comment: emailed L. Berdik RN 17:00 09/12/20 ([email or web address]) (NOTE) SARS-CoV-2 target nucleic acids are DETECTED.  SARS-CoV-2 RNA is generally detectable in upper respiratory specimens  during the acute phase of infection. Positive results are indicative of the presence of the identified virus, but do not rule out bacterial infection or co-infection with other pathogens not detected by the test. Clinical correlation with patient history and other diagnostic information is necessary to determine patient infection status. The expected result is Negative.  Fact Sheet for Patients:  https://www.moore.com/  Fact Sheet for Healthcare Providers: https://www.young.biz/  This test is not yet approved or cleared by the [place] FDA and  has been authorized for detection and/or diagnosis of SARS-CoV-2 by FDA under an Emergency Use Authorization (EUA).  This EUA will remain in effect (meaning this test can be used) for the duration of  the COVID-19  declaration under Section 564(b)(1) of the Act, 21 U.S.C. section 360bbb-3(b)(1), unless the authorization is terminated or revoked sooner.      Influenza A by PCR NEGATIVE NEGATIVE Final   Influenza B by PCR NEGATIVE NEGATIVE Final    Comment: (NOTE) The Xpert Xpress SARS-CoV-2/FLU/RSV assay is intended as an aid in  the diagnosis of influenza from Nasopharyngeal swab specimens and  should not be used as a sole basis for treatment. Nasal washings and  aspirates are unacceptable for Xpert Xpress SARS-CoV-2/FLU/RSV  testing.  Fact Sheet for Patients: https://www.moore.com/  Fact Sheet for Healthcare  Providers: https://www.young.biz/  This test is not yet approved or cleared by the [place] FDA and  has been authorized for detection and/or diagnosis of SARS-CoV-2 by  FDA under an Emergency Use Authorization (EUA). This EUA will remain  in effect (meaning this test can be used) for the duration of the  Covid-19 declaration under Section 564(b)(1) of the Act, 21  U.S.C. section 360bbb-3(b)(1), unless the authorization is  terminated or revoked. Performed at [facility], [address] [place], [place] [address]     Radiology Reports CT Angio Chest PE W and/or Wo Contrast  Result Date: 09/12/2020 CLINICAL DATA:  79 year old male with elevated D-dimer. Concern for pulmonary embolism. EXAM: CT ANGIOGRAPHY CHEST WITH CONTRAST TECHNIQUE: Multidetector CT imaging of the chest was performed using the standard protocol during bolus administration of intravenous contrast. Multiplanar CT image reconstructions and MIPs were obtained to evaluate the  vascular anatomy. CONTRAST:  75mL OMNIPAQUE IOHEXOL 350 MG/ML SOLN COMPARISON:  Chest radiograph dated 09/12/2020. FINDINGS: Evaluation of this exam is limited due to respiratory motion artifact. Cardiovascular: There is no cardiomegaly or pericardial effusion. There is 3 vessel coronary vascular calcification. Moderate atherosclerotic calcification of the thoracic aorta. No aneurysmal dilatation or dissection. The origins of the great vessels of the aortic arch appear patent as visualized. Evaluation of the pulmonary arteries is limited due to respiratory motion artifact as well as streak artifact caused by patient's arms. No large or central pulmonary artery embolus identified. Mediastinum/Nodes: No hilar or mediastinal adenopathy. The esophagus and the thyroid gland are grossly unremarkable. No mediastinal fluid collection. Lungs/Pleura: Extensive patchy bilateral pulmonary opacities with predominant involvement of the lower  lobes most consistent with multifocal pneumonia, likely viral or atypical in etiology including COVID-19. Clinical correlation is recommended. There are small bilateral pleural effusions. No pneumothorax. The central airways are patent. Upper Abdomen: No acute abnormality. Musculoskeletal: Degenerative changes of the spine. No acute osseous pathology. Review of the MIP images confirms the above findings. IMPRESSION: 1. No CT evidence of central pulmonary artery embolus. 2. Multifocal pneumonia, likely viral or atypical in etiology. Clinical correlation and follow-up to resolution recommended. 3. Small bilateral pleural effusions. 4. Aortic Atherosclerosis (ICD10-I70.0). Electronically Signed   By: Elgie CollardArash  Radparvar M.D.   On: 09/12/2020 19:05   DG Chest Port 1 View  Result Date: 09/12/2020 CLINICAL DATA:  Shortness of breath, COVID-19 positive EXAM: PORTABLE CHEST 1 VIEW COMPARISON:  10/07/2008 FINDINGS: Heart size within normal limits. Aortic atherosclerosis. There are diffuse interstitial opacities throughout the left lung, most confluent within the left lung base. Mild interstitial prominence within the right lung. No large pleural fluid collection. No pneumothorax. IMPRESSION: Diffuse interstitial opacities throughout the left lung, most confluent within the left lung base. Findings concerning for multifocal atypical/viral infection versus asymmetric edema. Electronically Signed   By: Duanne GuessNicholas  Plundo D.O.   On: 09/12/2020 14:59

## 2020-09-13 NOTE — Plan of Care (Signed)
  Problem: Clinical Measurements: Goal: Ability to maintain clinical measurements within normal limits will improve Outcome: Progressing Goal: Respiratory complications will improve Outcome: Progressing   Problem: Nutrition: Goal: Adequate nutrition will be maintained Outcome: Progressing   Problem: Elimination: Goal: Will not experience complications related to bowel motility Outcome: Progressing

## 2020-09-13 NOTE — Progress Notes (Signed)
Patient arrived on unit in respiratory distress.  MEWs is yellow due to respiration of 35 which is an improvement from previously recorded in ED. Protocol implemented and charge RN notified.  Will continue to monitor.

## 2020-09-13 NOTE — Progress Notes (Signed)
  Echocardiogram 2D Echocardiogram has been performed.  Larry Olsen 09/13/2020, 5:08 PM

## 2020-09-13 NOTE — Progress Notes (Signed)
Bilateral lower extremity venous duplex has been completed. Preliminary results can be found in CV Proc through chart review.   09/13/20 1:26 PM Olen Cordial RVT

## 2020-09-14 DIAGNOSIS — N179 Acute kidney failure, unspecified: Secondary | ICD-10-CM | POA: Diagnosis not present

## 2020-09-14 DIAGNOSIS — I248 Other forms of acute ischemic heart disease: Secondary | ICD-10-CM | POA: Diagnosis not present

## 2020-09-14 DIAGNOSIS — U071 COVID-19: Secondary | ICD-10-CM | POA: Diagnosis not present

## 2020-09-14 DIAGNOSIS — J9601 Acute respiratory failure with hypoxia: Secondary | ICD-10-CM | POA: Diagnosis not present

## 2020-09-14 LAB — COMPREHENSIVE METABOLIC PANEL
ALT: 56 U/L — ABNORMAL HIGH (ref 0–44)
AST: 84 U/L — ABNORMAL HIGH (ref 15–41)
Albumin: 2.7 g/dL — ABNORMAL LOW (ref 3.5–5.0)
Alkaline Phosphatase: 102 U/L (ref 38–126)
Anion gap: 10 (ref 5–15)
BUN: 32 mg/dL — ABNORMAL HIGH (ref 8–23)
CO2: 21 mmol/L — ABNORMAL LOW (ref 22–32)
Calcium: 8.8 mg/dL — ABNORMAL LOW (ref 8.9–10.3)
Chloride: 95 mmol/L — ABNORMAL LOW (ref 98–111)
Creatinine, Ser: 1.58 mg/dL — ABNORMAL HIGH (ref 0.61–1.24)
GFR, Estimated: 44 mL/min — ABNORMAL LOW (ref >60)
Glucose, Bld: 184 mg/dL — ABNORMAL HIGH (ref 70–99)
Potassium: 4.4 mmol/L (ref 3.5–5.1)
Sodium: 126 mmol/L — ABNORMAL LOW (ref 135–145)
Total Bilirubin: 0.9 mg/dL (ref 0.3–1.2)
Total Protein: 5.9 g/dL — ABNORMAL LOW (ref 6.5–8.1)

## 2020-09-14 LAB — CBC WITH DIFFERENTIAL/PLATELET
Abs Immature Granulocytes: 0.44 10*3/uL — ABNORMAL HIGH (ref 0.00–0.07)
Basophils Absolute: 0.1 10*3/uL (ref 0.0–0.1)
Basophils Relative: 0 %
Eosinophils Absolute: 0 10*3/uL (ref 0.0–0.5)
Eosinophils Relative: 0 %
HCT: 38.1 % — ABNORMAL LOW (ref 39.0–52.0)
Hemoglobin: 13.6 g/dL (ref 13.0–17.0)
Immature Granulocytes: 2 %
Lymphocytes Relative: 1 %
Lymphs Abs: 0.3 10*3/uL — ABNORMAL LOW (ref 0.7–4.0)
MCH: 31.1 pg (ref 26.0–34.0)
MCHC: 35.7 g/dL (ref 30.0–36.0)
MCV: 87.2 fL (ref 80.0–100.0)
Monocytes Absolute: 0.9 10*3/uL (ref 0.1–1.0)
Monocytes Relative: 4 %
Neutro Abs: 24 10*3/uL — ABNORMAL HIGH (ref 1.7–7.7)
Neutrophils Relative %: 93 %
Platelets: 316 10*3/uL (ref 150–400)
RBC: 4.37 MIL/uL (ref 4.22–5.81)
RDW: 12.8 % (ref 11.5–15.5)
WBC: 25.8 10*3/uL — ABNORMAL HIGH (ref 4.0–10.5)
nRBC: 0 % (ref 0.0–0.2)

## 2020-09-14 LAB — D-DIMER, QUANTITATIVE: D-Dimer, Quant: 3.87 ug/mL-FEU — ABNORMAL HIGH (ref 0.00–0.50)

## 2020-09-14 LAB — BASIC METABOLIC PANEL
Anion gap: 15 (ref 5–15)
BUN: 41 mg/dL — ABNORMAL HIGH (ref 8–23)
CO2: 18 mmol/L — ABNORMAL LOW (ref 22–32)
Calcium: 9.1 mg/dL (ref 8.9–10.3)
Chloride: 95 mmol/L — ABNORMAL LOW (ref 98–111)
Creatinine, Ser: 1.67 mg/dL — ABNORMAL HIGH (ref 0.61–1.24)
GFR, Estimated: 41 mL/min — ABNORMAL LOW (ref >60)
Glucose, Bld: 228 mg/dL — ABNORMAL HIGH (ref 70–99)
Potassium: 3.9 mmol/L (ref 3.5–5.1)
Sodium: 128 mmol/L — ABNORMAL LOW (ref 135–145)

## 2020-09-14 LAB — ECHOCARDIOGRAM COMPLETE
Area-P 1/2: 2.76 cm2
Height: 65 in
S' Lateral: 2.77 cm
Weight: 2320 oz

## 2020-09-14 LAB — FERRITIN: Ferritin: 1169 ng/mL — ABNORMAL HIGH (ref 24–336)

## 2020-09-14 LAB — C-REACTIVE PROTEIN: CRP: 11.9 mg/dL — ABNORMAL HIGH (ref <1.0)

## 2020-09-14 MED ORDER — SODIUM CHLORIDE 0.9 % IV SOLN: INTRAVENOUS | Status: AC

## 2020-09-14 MED ORDER — AMLODIPINE BESYLATE 2.5 MG PO TABS
2.5000 mg | ORAL_TABLET | Freq: Every day | ORAL | Status: DC
  Administered 2020-09-14 – 2020-09-24 (×11): 2.5 mg via ORAL
  Filled 2020-09-14 (×12): qty 1

## 2020-09-14 MED ORDER — WHITE PETROLATUM EX OINT
TOPICAL_OINTMENT | CUTANEOUS | Status: AC
  Filled 2020-09-14: qty 28.35

## 2020-09-14 NOTE — Plan of Care (Signed)
  Problem: Education: Goal: Knowledge of risk factors and measures for prevention of condition will improve Outcome: Progressing   Problem: Coping: Goal: Psychosocial and spiritual needs will be supported Outcome: Progressing   Problem: Respiratory: Goal: Will maintain a patent airway Outcome: Progressing Goal: Complications related to the disease process, condition or treatment will be avoided or minimized Outcome: Progressing   Problem: Education: Goal: Knowledge of risk factors and measures for prevention of condition will improve Outcome: Progressing   Problem: Coping: Goal: Psychosocial and spiritual needs will be supported Outcome: Progressing   Problem: Respiratory: Goal: Will maintain a patent airway Outcome: Progressing Goal: Complications related to the disease process, condition or treatment will be avoided or minimized Outcome: Progressing   Problem: Clinical Measurements: Goal: Ability to maintain clinical measurements within normal limits will improve Outcome: Progressing Goal: Respiratory complications will improve Outcome: Progressing   Problem: Nutrition: Goal: Adequate nutrition will be maintained Outcome: Progressing   Problem: Elimination: Goal: Will not experience complications related to bowel motility Outcome: Progressing Goal: Will not experience complications related to urinary retention Outcome: Progressing   Problem: Safety: Goal: Ability to remain free from injury will improve Outcome: Progressing   Problem: Skin Integrity: Goal: Risk for impaired skin integrity will decrease Outcome: Progressing

## 2020-09-14 NOTE — Progress Notes (Signed)
PROGRESS NOTE                                                                                                                                                                                                             Patient Demographics:    Larry Olsen, is a 79 y.o. male, DOB - [birth year], LDJ:[id number]  Outpatient Primary MD for the patient is Patient, No Pcp Per   Admit date - 09/12/2020   LOS - 2  Chief Complaint  Patient presents with  . Shortness of Breath  . Covid Positive       Brief Narrative: Patient is a 79 y.o. male with PMHx of HTN, HLD-known Covid 19+ approximately 2 weeks prior to this hospital stay-presenting with severe shortness of breath-found to have severe acute hypoxemic respiratory failure requiring 15 L of HFNC in the setting of COVID-19 pneumonia.  See below for further details  COVID-19 vaccinated status: Unvaccinated  Significant Events: 10/26>> Admit to [facility] for severe hypoxia due to COVID-19 pneumonia, AKI and hyponatremia.  Significant studies: 10/26>>Chest x-ray: Diffuse interstitial airspace opacities 10/26>> CTA chest: No PE, multifocal pneumonia 10/27>> bilateral lower extremity Doppler: No DVT 10/27>> Echo: EF 55-60%, no wall motion abnormalities, small pericardial effusion  COVID-19 medications: Steroids: 10/26>> Remdesivir: 10/26>> Actemra: 10/26 x 1  Antibiotics: None  Microbiology data: 10/26 >>blood culture: No growth  Procedures: None  Consults: None  DVT prophylaxis: Prophylactic Lovenox at intermediate dosing   Subjective:   He remains unchanged-continues to have shortness of breath with minimal activity.  On heated high flow and NRB today-when he takes off his NRB to eat-he desaturates.   Assessment  & Plan :   Acute Hypoxic Resp Failure due to Covid 19 Viral pneumonia: Continues to be very tenuous-continues to have severe hypoxemia-on heated high  flow and NRB.  Continue Solu-Medrol and Remdesivir.  Does not appear to have volume overload-does not require diuretics.  Extensive discussion with patient-daughter over the past day or so-he is a DNR-both patient and family realized that if he deteriorates-apart from transitioning to comfort measures-really no other options.   Fever: afebrile O2 requirements:  SpO2: 97 % O2 Flow Rate (L/min): 30 L/min FiO2 (%): 100 %   COVID-19 Labs: Recent Labs    09/12/20 1341 09/13/20 0121 09/14/20 0106  DDIMER 4.45* 4.74* 3.87*  FERRITIN 806*  743* 1,169*  LDH 413*  --   --   CRP 15.5* 16.8* 11.9*       Component Value Date/Time   BNP 240.6 (H) 09/12/2020 1342    Recent Labs  Lab 09/12/20 1341  PROCALCITON 0.72    Lab Results  Component Value Date   SARSCOV2NAA POSITIVE (A) 09/12/2020    Prone/Incentive Spirometry: encouraged patient to lie prone for 3-4 hours at a time for a total of 16 hours a day, and to encourage incentive spirometry use 3-4/hour.  Elevated D-dimer: Secondary to COVID-19-CTA chest negative for PE/lower extremity Dopplers negative for DVT-continue intermediate dosing of Lovenox.  Follow.    AKI: Likely hemodynamically mediated-had resolved on 10/27-creatinine creeping up-restart gentle hydration-reassess tomorrow with a.m. labs.  Hyponatremia: Secondary to hypovolemia/dehydration-slowly improving-appears on the drier side-sodium has plateaued around 125-restarting gentle hydration-reassess tomorrow with a.m. labs.    Transaminitis: Secondary to COVID-19-appears mild-stable for close monitoring/follow-up.  Troponin elevation: Trend is flat-not consistent with ACS-likely demand ischemia due to severe hypoxemia/AKI-echo with preserved EF-no wall motion abnormality.  Doubt further work-up required while inpatient.  HTN: BP slowly creeping up-start low-dose amlodipine-hold losartan due to AKI.  Follow and optimize.   HLD: Cautiously continue with statin-watch  LFTs.  Palliative care discussion: Long discussion with patient at bedside on 10/27-subsequently spoke with his daughter Larry Olsen over the phone-patient does not desire aggressive care including intubation/CPR if he were to deteriorate-daughter understands his wishes-has spoken to the patient about this.  DNR order placed on 10/27-both patient and family aware that if he deteriorates or starts suffering/exhibiting severe respiratory distress-apart from transitioning him to comfort measures-really no other options.  He appears to have severe COVID-19 pneumonia and remains at significant risk for further deterioration  GI prophylaxis: PPI  ABG: No results found for: PHART, PCO2ART, PO2ART, HCO3, TCO2, ACIDBASEDEF, O2SAT  Vent Settings: N/A FiO2 (%):  [100 %] 100 %  Condition - Extremely Guarded  Family Communication  : Daughter Larry Olsen(Larry Olsen [phone]) updated over the phone on 10/28  Code Status:DNR  Diet :  Diet Order            Diet Heart Room service appropriate? Yes; Fluid consistency: Thin  Diet effective now                  Disposition Plan  :   Status is: Inpatient  Remains inpatient appropriate because:Inpatient level of care appropriate due to severity of illness   Dispo: The patient is from: Home              Anticipated d/c is to: TBD              Anticipated d/c date is: > 3 days              Patient currently is not medically stable to d/c.   Barriers to discharge: Hypoxia requiring O2 supplementation/complete 5 days of IV Remdesivir  Antimicorbials  :    Anti-infectives (From admission, onward)   Start     Dose/Rate Route Frequency Ordered Stop   09/13/20 1000  remdesivir 100 mg in sodium chloride 0.9 % 100 mL IVPB       "Followed by" Linked Group Details   100 mg 200 mL/hr over 30 Minutes Intravenous Daily 09/12/20 1509 09/17/20 0959   09/12/20 1630  remdesivir 200 mg in sodium chloride 0.9% 250 mL IVPB       "Followed by" Linked Group Details   200  mg 580 mL/hr over 30  Minutes Intravenous Once 09/12/20 1509 09/13/20 0915      Inpatient Medications  Scheduled Meds: . atorvastatin  10 mg Oral Daily  . enoxaparin (LOVENOX) injection  40 mg Subcutaneous Q12H  . methylPREDNISolone (SOLU-MEDROL) injection  60 mg Intravenous Q12H  . pantoprazole  40 mg Oral Q1200  . sodium chloride flush  3 mL Intravenous Q12H   Continuous Infusions: . sodium chloride 50 mL/hr at 09/14/20 0948  . remdesivir 100 mg in NS 100 mL 100 mg (09/14/20 0954)   PRN Meds:.acetaminophen, albuterol, chlorpheniramine-HYDROcodone, guaiFENesin-dextromethorphan, ondansetron (ZOFRAN) IV, oxymetazoline, polyethylene glycol, sodium chloride   Time Spent in minutes  35   See all Orders from today for further details   Jeoffrey Massed M.D on 09/14/2020 at 11:10 AM  To page go to [email or web address] - use universal password  [facility] -  Office  [phone]    Objective:   Vitals:   09/14/20 0400 09/14/20 0742 09/14/20 0836 09/14/20 1000  BP: (!) 142/85 (!) 161/92  (!) 144/84  Pulse: 91 100 (!) 119 98  Resp: 18 19    Temp: 98.1 F (36.7 C) 97.7 F (36.5 C)  97.8 F (36.6 C)  TempSrc: Oral Oral  Oral  SpO2: 93% 90% (!) 88% 97%  Weight:      Height:        Wt Readings from Last 3 Encounters:  09/12/20 65.8 kg     Intake/Output Summary (Last 24 hours) at 09/14/2020 1110 Last data filed at 09/13/2020 2015 Gross per 24 hour  Intake 57.65 ml  Output 400 ml  Net -342.35 ml     Physical Exam Gen Exam:Alert awake-not in any distress HEENT:atraumatic, normocephalic Chest: B/L clear to auscultation anteriorly CVS:S1S2 regular Abdomen:soft non tender, non distended Extremities:no edema Neurology: Non focal Skin: no rash   Data Review:    CBC Recent Labs  Lab 09/12/20 1341 09/13/20 0121 09/14/20 0106  WBC 15.3* 14.0* 25.8*  HGB 12.0* 12.7* 13.6  HCT 33.8* 35.6* 38.1*  PLT 207 211 316  MCV 88.7 86.8 87.2  MCH 31.5 31.0 31.1  MCHC  35.5 35.7 35.7  RDW 12.6 12.6 12.8  LYMPHSABS 0.2* 0.2* 0.3*  MONOABS 0.7 0.5 0.9  EOSABS 0.0 0.0 0.0  BASOSABS 0.0 0.0 0.1    Chemistries  Recent Labs  Lab 09/12/20 1341 09/12/20 1341 09/12/20 2115 09/13/20 0121 09/13/20 0745 09/13/20 1544 09/14/20 0106  NA 120*   < > 121* 124* 126* 125* 126*  K 4.4   < > 4.7 4.3 4.2 4.1 4.4  CL 89*   < > 91* 94* 94* 92* 95*  CO2 20*   < > 20* 20* 20* 20* 21*  GLUCOSE 186*   < > 195* 174* 142* 213* 184*  BUN 17   < > 14 13 15  24* 32*  CREATININE 1.47*   < > 1.01 0.96 0.90 1.34* 1.58*  CALCIUM 8.2*   < > 8.2* 8.3* 8.5* 9.1 8.8*  MG  --   --   --  1.9  --   --   --   AST 87*  --   --  80*  --   --  84*  ALT 52*  --   --  48*  --   --  56*  ALKPHOS 83  --   --  88  --   --  102  BILITOT 1.7*  --   --  1.4*  --   --  0.9   < > =  values in this interval not displayed.   ------------------------------------------------------------------------------------------------------------------ Recent Labs    09/12/20 1341  TRIG 58    No results found for: HGBA1C ------------------------------------------------------------------------------------------------------------------ No results for input(s): TSH, T4TOTAL, T3FREE, THYROIDAB in the last 72 hours.  Invalid input(s): FREET3 ------------------------------------------------------------------------------------------------------------------ Recent Labs    09/13/20 0121 09/14/20 0106  FERRITIN 743* 1,169*    Coagulation profile No results for input(s): INR, PROTIME in the last 168 hours.  Recent Labs    09/13/20 0121 09/14/20 0106  DDIMER 4.74* 3.87*    Cardiac Enzymes No results for input(s): CKMB, TROPONINI, MYOGLOBIN in the last 168 hours.  Invalid input(s): CK ------------------------------------------------------------------------------------------------------------------    Component Value Date/Time   BNP 240.6 (H) 09/12/2020 1342    Micro Results Recent Results (from  the past 240 hour(s))  Blood Culture (routine x 2)     Status: None (Preliminary result)   Collection Time: 09/12/20  2:44 PM   Specimen: BLOOD LEFT ARM  Result Value Ref Range Status   Specimen Description BLOOD LEFT ARM  Final   Special Requests   Final    BOTTLES DRAWN AEROBIC AND ANAEROBIC Blood Culture results may not be optimal due to an inadequate volume of blood received in culture bottles   Culture   Final    NO GROWTH 2 DAYS Performed at [facility], [address], [place], [place] [address]    Report Status PENDING  Incomplete  Blood Culture (routine x 2)     Status: None (Preliminary result)   Collection Time: 09/12/20  3:00 PM   Specimen: BLOOD RIGHT ARM  Result Value Ref Range Status   Specimen Description BLOOD RIGHT ARM  Final   Special Requests   Final    BOTTLES DRAWN AEROBIC AND ANAEROBIC Blood Culture adequate volume   Culture   Final    NO GROWTH 2 DAYS Performed at [facility], [address] [place], [place] [address]    Report Status PENDING  Incomplete  Respiratory Panel by RT PCR (Flu A&B, Covid) - Nasopharyngeal Swab     Status: Abnormal   Collection Time: 09/12/20  3:08 PM   Specimen: Nasopharyngeal Swab  Result Value Ref Range Status   SARS Coronavirus 2 by RT PCR POSITIVE (A) NEGATIVE Final    Comment: emailed L. Berdik RN 17:00 09/12/20 ([email or web address]) (NOTE) SARS-CoV-2 target nucleic acids are DETECTED.  SARS-CoV-2 RNA is generally detectable in upper respiratory specimens  during the acute phase of infection. Positive results are indicative of the presence of the identified virus, but do not rule out bacterial infection or co-infection with other pathogens not detected by the test. Clinical correlation with patient history and other diagnostic information is necessary to determine patient infection status. The expected result is Negative.  Fact Sheet for Patients:  https://www.moore.com/  Fact Sheet for  Healthcare Providers: https://www.young.biz/  This test is not yet approved or cleared by the [place] FDA and  has been authorized for detection and/or diagnosis of SARS-CoV-2 by FDA under an Emergency Use Authorization (EUA).  This EUA will remain in effect (meaning this test can be used) for the duration of  the COVID-19  declaration under Section 564(b)(1) of the Act, 21 U.S.C. section 360bbb-3(b)(1), unless the authorization is terminated or revoked sooner.      Influenza A by PCR NEGATIVE NEGATIVE Final   Influenza B by PCR NEGATIVE NEGATIVE Final    Comment: (NOTE) The Xpert Xpress SARS-CoV-2/FLU/RSV assay is intended as an aid  in  the diagnosis of influenza from Nasopharyngeal swab specimens and  should not be used as a sole basis for treatment. Nasal washings and  aspirates are unacceptable for Xpert Xpress SARS-CoV-2/FLU/RSV  testing.  Fact Sheet for Patients: https://www.moore.com/  Fact Sheet for Healthcare Providers: https://www.young.biz/  This test is not yet approved or cleared by the [place] FDA and  has been authorized for detection and/or diagnosis of SARS-CoV-2 by  FDA under an Emergency Use Authorization (EUA). This EUA will remain  in effect (meaning this test can be used) for the duration of the  Covid-19 declaration under Section 564(b)(1) of the Act, 21  U.S.C. section 360bbb-3(b)(1), unless the authorization is  terminated or revoked. Performed at [facility], [address], [place], [place] [address]     Radiology Reports CT Angio Chest PE W and/or Wo Contrast  Result Date: 09/12/2020 CLINICAL DATA:  79 year old male with elevated D-dimer. Concern for pulmonary embolism. EXAM: CT ANGIOGRAPHY CHEST WITH CONTRAST TECHNIQUE: Multidetector CT imaging of the chest was performed using the standard protocol during bolus administration of intravenous contrast. Multiplanar CT  image reconstructions and MIPs were obtained to evaluate the vascular anatomy. CONTRAST:  75mL OMNIPAQUE IOHEXOL 350 MG/ML SOLN COMPARISON:  Chest radiograph dated 09/12/2020. FINDINGS: Evaluation of this exam is limited due to respiratory motion artifact. Cardiovascular: There is no cardiomegaly or pericardial effusion. There is 3 vessel coronary vascular calcification. Moderate atherosclerotic calcification of the thoracic aorta. No aneurysmal dilatation or dissection. The origins of the great vessels of the aortic arch appear patent as visualized. Evaluation of the pulmonary arteries is limited due to respiratory motion artifact as well as streak artifact caused by patient's arms. No large or central pulmonary artery embolus identified. Mediastinum/Nodes: No hilar or mediastinal adenopathy. The esophagus and the thyroid gland are grossly unremarkable. No mediastinal fluid collection. Lungs/Pleura: Extensive patchy bilateral pulmonary opacities with predominant involvement of the lower lobes most consistent with multifocal pneumonia, likely viral or atypical in etiology including COVID-19. Clinical correlation is recommended. There are small bilateral pleural effusions. No pneumothorax. The central airways are patent. Upper Abdomen: No acute abnormality. Musculoskeletal: Degenerative changes of the spine. No acute osseous pathology. Review of the MIP images confirms the above findings. IMPRESSION: 1. No CT evidence of central pulmonary artery embolus. 2. Multifocal pneumonia, likely viral or atypical in etiology. Clinical correlation and follow-up to resolution recommended. 3. Small bilateral pleural effusions. 4. Aortic Atherosclerosis (ICD10-I70.0). Electronically Signed   By: Elgie Collard M.D.   On: 09/12/2020 19:05   DG Chest Port 1 View  Result Date: 09/12/2020 CLINICAL DATA:  Shortness of breath, COVID-19 positive EXAM: PORTABLE CHEST 1 VIEW COMPARISON:  10/07/2008 FINDINGS: Heart size within normal  limits. Aortic atherosclerosis. There are diffuse interstitial opacities throughout the left lung, most confluent within the left lung base. Mild interstitial prominence within the right lung. No large pleural fluid collection. No pneumothorax. IMPRESSION: Diffuse interstitial opacities throughout the left lung, most confluent within the left lung base. Findings concerning for multifocal atypical/viral infection versus asymmetric edema. Electronically Signed   By: Duanne Guess D.O.   On: 09/12/2020 14:59   ECHOCARDIOGRAM COMPLETE  Result Date: 09/14/2020    ECHOCARDIOGRAM REPORT   Patient Name:   CID AGENA Date of Exam: 09/13/2020 Medical Rec #:  [id number]       Height:       65.0 in Accession #:    [id number]      Weight:  145.0 lb Date of Birth:  [birth year]       BSA:          1.725 m Patient Age:    79 years        BP:           141/76 mmHg Patient Gender: M               HR:           101 bpm. Exam Location:  Inpatient Procedure: 2D Echo, Cardiac Doppler and Color Doppler Indications:    Acute Respiratory Failure 989.21 / R06.89  History:        Patient has no prior history of Echocardiogram examinations.                 Risk Factors:Hypertension. Covid -19 Positive.  Sonographer:    Tiffany Dance Referring Phys: Jeoffrey Massed, M IMPRESSIONS  1. Left ventricular ejection fraction, by estimation, is 55 to 60%. The left ventricle has normal function. The left ventricle has no regional wall motion abnormalities. There is mild concentric left ventricular hypertrophy. Left ventricular diastolic parameters were normal.  2. Right ventricular systolic function is normal. The right ventricular size is normal. There is normal pulmonary artery systolic pressure. The estimated right ventricular systolic pressure is 24.5 mmHg.  3. A small pericardial effusion is present. There is no evidence of cardiac tamponade.  4. The mitral valve is normal in structure. Trivial mitral valve regurgitation. No  evidence of mitral stenosis.  5. The aortic valve is tricuspid. There is mild calcification of the aortic valve. Aortic valve regurgitation is not visualized. Mild aortic valve sclerosis is present, with no evidence of aortic valve stenosis.  6. The inferior vena cava is normal in size with greater than 50% respiratory variability, suggesting right atrial pressure of 3 mmHg. Comparison(s): No prior Echocardiogram. Conclusion(s)/Recommendation(s): Otherwise normal echocardiogram, with minor abnormalities described in the report. FINDINGS  Left Ventricle: Left ventricular ejection fraction, by estimation, is 55 to 60%. The left ventricle has normal function. The left ventricle has no regional wall motion abnormalities. The left ventricular internal cavity size was normal in size. There is  mild concentric left ventricular hypertrophy. Left ventricular diastolic parameters were normal. Right Ventricle: The right ventricular size is normal. No increase in right ventricular wall thickness. Right ventricular systolic function is normal. There is normal pulmonary artery systolic pressure. The tricuspid regurgitant velocity is 2.32 m/s, and  with an assumed right atrial pressure of 3 mmHg, the estimated right ventricular systolic pressure is 24.5 mmHg. Left Atrium: Left atrial size was normal in size. Right Atrium: Right atrial size was normal in size. Pericardium: A small pericardial effusion is present. There is no evidence of cardiac tamponade. Mitral Valve: The mitral valve is normal in structure. Trivial mitral valve regurgitation. No evidence of mitral valve stenosis. Tricuspid Valve: The tricuspid valve is normal in structure. Tricuspid valve regurgitation is trivial. No evidence of tricuspid stenosis. Aortic Valve: The aortic valve is tricuspid. There is mild calcification of the aortic valve. Aortic valve regurgitation is not visualized. Mild aortic valve sclerosis is present, with no evidence of aortic valve  stenosis. Pulmonic Valve: The pulmonic valve was not well visualized. Pulmonic valve regurgitation is not visualized. No evidence of pulmonic stenosis. Aorta: The aortic root, ascending aorta and aortic arch are all structurally normal, with no evidence of dilitation or obstruction. Venous: The inferior vena cava is normal in size with greater than 50% respiratory variability, suggesting  right atrial pressure of 3 mmHg. IAS/Shunts: No atrial level shunt detected by color flow Doppler.  LEFT VENTRICLE PLAX 2D LVIDd:         4.00 cm LVIDs:         2.77 cm LV PW:         1.24 cm LV IVS:        1.16 cm LVOT diam:     2.00 cm LV SV:         51 LV SV Index:   30 LVOT Area:     3.14 cm  RIGHT VENTRICLE          IVC RV Basal diam:  2.58 cm  IVC diam: 1.42 cm TAPSE (M-mode): 2.4 cm LEFT ATRIUM             Index       RIGHT ATRIUM           Index LA diam:        3.30 cm 1.91 cm/m  RA Area:     12.00 cm LA Vol (A2C):   41.1 ml 23.82 ml/m RA Volume:   23.40 ml  13.56 ml/m LA Vol (A4C):   34.8 ml 20.17 ml/m LA Biplane Vol: 40.1 ml 23.24 ml/m  AORTIC VALVE LVOT Vmax:   92.15 cm/s LVOT Vmean:  59.400 cm/s LVOT VTI:    0.163 m  AORTA Ao Root diam: 3.50 cm Ao Asc diam:  3.60 cm MITRAL VALVE               TRICUSPID VALVE MV Area (PHT): 2.76 cm    TR Peak grad:   21.5 mmHg MV Decel Time: 275 msec    TR Vmax:        232.00 cm/s MV E velocity: 60.60 cm/s MV A velocity: 85.70 cm/s  SHUNTS MV E/A ratio:  0.71        Systemic VTI:  0.16 m                            Systemic Diam: 2.00 cm Jodelle Red MD Electronically signed by Jodelle Red MD Signature Date/Time: 09/14/2020/8:03:57 AM    Final    VAS [place] LOWER EXTREMITY VENOUS (DVT)  Result Date: 09/13/2020  Lower Venous DVTStudy Indications: Elevated Ddimer.  Risk Factors: COVID 19 positive. Comparison Study: No prior studies. Performing Technologist: Chanda Busing RVT  Examination Guidelines: A complete evaluation includes B-mode imaging, spectral  Doppler, color Doppler, and power Doppler as needed of all accessible portions of each vessel. Bilateral testing is considered an integral part of a complete examination. Limited examinations for reoccurring indications may be performed as noted. The reflux portion of the exam is performed with the patient in reverse Trendelenburg.  +---------+---------------+---------+-----------+----------+--------------+ RIGHT    CompressibilityPhasicitySpontaneityPropertiesThrombus Aging +---------+---------------+---------+-----------+----------+--------------+ CFV      Full           Yes      Yes                                 +---------+---------------+---------+-----------+----------+--------------+ SFJ      Full                                                        +---------+---------------+---------+-----------+----------+--------------+  FV Prox  Full                                                        +---------+---------------+---------+-----------+----------+--------------+ FV Mid   Full                                                        +---------+---------------+---------+-----------+----------+--------------+ FV DistalFull                                                        +---------+---------------+---------+-----------+----------+--------------+ PFV      Full                                                        +---------+---------------+---------+-----------+----------+--------------+ POP      Full           Yes      Yes                                 +---------+---------------+---------+-----------+----------+--------------+ PTV      Full                                                        +---------+---------------+---------+-----------+----------+--------------+ PERO     Full                                                        +---------+---------------+---------+-----------+----------+--------------+    +---------+---------------+---------+-----------+----------+--------------+ LEFT     CompressibilityPhasicitySpontaneityPropertiesThrombus Aging +---------+---------------+---------+-----------+----------+--------------+ CFV      Full           Yes      Yes                                 +---------+---------------+---------+-----------+----------+--------------+ SFJ      Full                                                        +---------+---------------+---------+-----------+----------+--------------+ FV Prox  Full                                                        +---------+---------------+---------+-----------+----------+--------------+  FV Mid   Full                                                        +---------+---------------+---------+-----------+----------+--------------+ FV DistalFull                                                        +---------+---------------+---------+-----------+----------+--------------+ PFV      Full                                                        +---------+---------------+---------+-----------+----------+--------------+ POP      Full           Yes      Yes                                 +---------+---------------+---------+-----------+----------+--------------+ PTV      Full                                                        +---------+---------------+---------+-----------+----------+--------------+ PERO     Full                                                        +---------+---------------+---------+-----------+----------+--------------+     Summary: RIGHT: - There is no evidence of deep vein thrombosis in the lower extremity.  - No cystic structure found in the popliteal fossa.  LEFT: - There is no evidence of deep vein thrombosis in the lower extremity.  - No cystic structure found in the popliteal fossa.  *See table(s) above for measurements and observations. Electronically signed  by Coral Else MD on 09/13/2020 at 8:53:51 PM.    Final

## 2020-09-14 NOTE — Progress Notes (Signed)
Spoke with pt family member, Marvene Staff @ [phone], updates given. Family member also stated that she spoke with MD regarding pt as well.

## 2020-09-14 NOTE — Progress Notes (Signed)
Pt is not a yellow MEWS, pt was being transferred from bed to chair, earlier this morning, respiratory assessment happened  right after the transfer.

## 2020-09-14 NOTE — Plan of Care (Signed)
Patient is currently resting in bed. Denies pain. VSS. Remains on HHF, currently on 30L. Condom cath in place. 1 assist. Call bell within reach. Bed alarm on.   Problem: Education: Goal: Knowledge of risk factors and measures for prevention of condition will improve Outcome: Progressing   Problem: Coping: Goal: Psychosocial and spiritual needs will be supported Outcome: Progressing   Problem: Respiratory: Goal: Will maintain a patent airway Outcome: Progressing Goal: Complications related to the disease process, condition or treatment will be avoided or minimized Outcome: Progressing   Problem: Clinical Measurements: Goal: Ability to maintain clinical measurements within normal limits will improve Outcome: Progressing Goal: Respiratory complications will improve Outcome: Progressing   Problem: Nutrition: Goal: Adequate nutrition will be maintained Outcome: Progressing   Problem: Elimination: Goal: Will not experience complications related to bowel motility Outcome: Progressing Goal: Will not experience complications related to urinary retention Outcome: Progressing   Problem: Safety: Goal: Ability to remain free from injury will improve Outcome: Progressing   Problem: Skin Integrity: Goal: Risk for impaired skin integrity will decrease Outcome: Progressing   Problem: Education: Goal: Knowledge of risk factors and measures for prevention of condition will improve Outcome: Progressing   Problem: Coping: Goal: Psychosocial and spiritual needs will be supported Outcome: Progressing   Problem: Respiratory: Goal: Will maintain a patent airway Outcome: Progressing Goal: Complications related to the disease process, condition or treatment will be avoided or minimized Outcome: Progressing

## 2020-09-15 ENCOUNTER — Inpatient Hospital Stay (HOSPITAL_COMMUNITY): Payer: Medicare Other

## 2020-09-15 DIAGNOSIS — U071 COVID-19: Secondary | ICD-10-CM | POA: Diagnosis not present

## 2020-09-15 DIAGNOSIS — N179 Acute kidney failure, unspecified: Secondary | ICD-10-CM | POA: Diagnosis not present

## 2020-09-15 DIAGNOSIS — I248 Other forms of acute ischemic heart disease: Secondary | ICD-10-CM | POA: Diagnosis not present

## 2020-09-15 DIAGNOSIS — J9601 Acute respiratory failure with hypoxia: Secondary | ICD-10-CM | POA: Diagnosis not present

## 2020-09-15 LAB — COMPREHENSIVE METABOLIC PANEL
ALT: 59 U/L — ABNORMAL HIGH (ref 0–44)
AST: 87 U/L — ABNORMAL HIGH (ref 15–41)
Albumin: 2.8 g/dL — ABNORMAL LOW (ref 3.5–5.0)
Alkaline Phosphatase: 113 U/L (ref 38–126)
Anion gap: 15 (ref 5–15)
BUN: 41 mg/dL — ABNORMAL HIGH (ref 8–23)
CO2: 19 mmol/L — ABNORMAL LOW (ref 22–32)
Calcium: 9 mg/dL (ref 8.9–10.3)
Chloride: 95 mmol/L — ABNORMAL LOW (ref 98–111)
Creatinine, Ser: 1.66 mg/dL — ABNORMAL HIGH (ref 0.61–1.24)
GFR, Estimated: 41 mL/min — ABNORMAL LOW (ref >60)
Glucose, Bld: 177 mg/dL — ABNORMAL HIGH (ref 70–99)
Potassium: 4 mmol/L (ref 3.5–5.1)
Sodium: 129 mmol/L — ABNORMAL LOW (ref 135–145)
Total Bilirubin: 1 mg/dL (ref 0.3–1.2)
Total Protein: 6.3 g/dL — ABNORMAL LOW (ref 6.5–8.1)

## 2020-09-15 LAB — CBC WITH DIFFERENTIAL/PLATELET
Abs Immature Granulocytes: 0 10*3/uL (ref 0.00–0.07)
Basophils Absolute: 0 10*3/uL (ref 0.0–0.1)
Basophils Relative: 0 %
Eosinophils Absolute: 0 10*3/uL (ref 0.0–0.5)
Eosinophils Relative: 0 %
HCT: 40.2 % (ref 39.0–52.0)
Hemoglobin: 14.3 g/dL (ref 13.0–17.0)
Lymphocytes Relative: 0 %
Lymphs Abs: 0 10*3/uL — ABNORMAL LOW (ref 0.7–4.0)
MCH: 31.3 pg (ref 26.0–34.0)
MCHC: 35.6 g/dL (ref 30.0–36.0)
MCV: 88 fL (ref 80.0–100.0)
Monocytes Absolute: 0.3 10*3/uL (ref 0.1–1.0)
Monocytes Relative: 1 %
Neutro Abs: 28.1 10*3/uL — ABNORMAL HIGH (ref 1.7–7.7)
Neutrophils Relative %: 99 %
Platelets: 400 10*3/uL (ref 150–400)
RBC: 4.57 MIL/uL (ref 4.22–5.81)
RDW: 13 % (ref 11.5–15.5)
WBC: 28.4 10*3/uL — ABNORMAL HIGH (ref 4.0–10.5)
nRBC: 0 % (ref 0.0–0.2)
nRBC: 0 /100 WBC

## 2020-09-15 LAB — FERRITIN: Ferritin: 1351 ng/mL — ABNORMAL HIGH (ref 24–336)

## 2020-09-15 LAB — C-REACTIVE PROTEIN: CRP: 6.3 mg/dL — ABNORMAL HIGH (ref <1.0)

## 2020-09-15 LAB — D-DIMER, QUANTITATIVE: D-Dimer, Quant: 2.73 ug/mL-FEU — ABNORMAL HIGH (ref 0.00–0.50)

## 2020-09-15 NOTE — Progress Notes (Signed)
Assisted patient to use [facility] by the RN and NT. Patient with dyspnea, tachypnea, tachycardia, and desaturation to 87%. Placed on both heated HFNC and NRB withoxygen saturation going up to 98%. Heart rate up to 130's, patient with facial flushing and dyspnea. Assisted back in bed when patient unable to have BM. Heart rate down to 111 after 10 minutes, Oxygen saturation 94% on heated high flow [place]. Patient still have some dyspnea. Instructed to do breathing exercises, and relaxation techniques.

## 2020-09-15 NOTE — Progress Notes (Signed)
PROGRESS NOTE                                                                                                                                                                                                             Patient Demographics:    Larry Olsen, is a 79 y.o. male, DOB - [birth year], YIR:[id number]  Outpatient Primary MD for the patient is Patient, No Pcp Per   Admit date - 09/12/2020   LOS - 3  Chief Complaint  Patient presents with  . Shortness of Breath  . Covid Positive       Brief Narrative: Patient is a 79 y.o. male with PMHx of HTN, HLD-known Covid 19+ approximately 2 weeks prior to this hospital stay-presenting with severe shortness of breath-found to have severe acute hypoxemic respiratory failure requiring 15 L of HFNC in the setting of COVID-19 pneumonia.  See below for further details  COVID-19 vaccinated status: Unvaccinated  Significant Events: 10/26>> Admit to [facility] for severe hypoxia due to COVID-19 pneumonia, AKI and hyponatremia.  Significant studies: 10/26>>Chest x-ray: Diffuse interstitial airspace opacities 10/26>> CTA chest: No PE, multifocal pneumonia 10/27>> bilateral lower extremity Doppler: No DVT 10/27>> Echo: EF 55-60%, no wall motion abnormalities, small pericardial effusion  COVID-19 medications: Steroids: 10/26>> Remdesivir: 10/26>> Actemra: 10/26 x 1  Antibiotics: None  Microbiology data: 10/26 >>blood culture: No growth  Procedures: None  Consults: None  DVT prophylaxis: Prophylactic Lovenox at intermediate dosing   Subjective:   Remains stable at rest-appears unchanged-continues to have shortness of breath with minimal exertion.   Assessment  & Plan :   Acute Hypoxic Resp Failure due to Covid 19 Viral pneumonia: Unchanged-remains tenuous-continues to have severe hypoxemia on heated high flow.  No evidence of volume overload.  Continue  steroids/Remdesivir.  DNR confirmed again today with patient-he understands that if he deteriorates further or starts getting very symptomatic-apart from transitioning to full comfort care we really have no further options.  Continue supportive care-follow clinical course closely-although tenuous-he remains relatively stable-does not need transition to comfort care yet.  Fever: afebrile O2 requirements:  SpO2: 96 % O2 Flow Rate (L/min): 30 L/min FiO2 (%): 100 %   COVID-19 Labs: Recent Labs    09/12/20 1341 09/12/20 1341 09/13/20 0121 09/14/20 0106 09/15/20 0119  DDIMER 4.45*   < > 4.74* 3.87* 2.73*  FERRITIN  806*   < > 743* 1,169* 1,351*  LDH 413*  --   --   --   --   CRP 15.5*   < > 16.8* 11.9* 6.3*   < > = values in this interval not displayed.       Component Value Date/Time   BNP 240.6 (H) 09/12/2020 1342    Recent Labs  Lab 09/12/20 1341  PROCALCITON 0.72    Lab Results  Component Value Date   SARSCOV2NAA POSITIVE (A) 09/12/2020    Prone/Incentive Spirometry: encouraged patient to lie prone for 3-4 hours at a time for a total of 16 hours a day, and to encourage incentive spirometry use 3-4/hour.  Elevated D-dimer: Secondary to COVID-19-CTA chest negative for PE/lower extremity Dopplers negative for DVT-continue intermediate dosing of Lovenox.  Follow.    AKI: Likely hemodynamically mediated-had resolved on 10/27-creatinine had crept up-but seems to have plateaued-avoid nephrotoxic agents-reassess with a.m. labs.  Hyponatremia: Secondary to hypovolemia/dehydration-improved after gentle hydration.  Volume status remains stable.  Transaminitis: Secondary to COVID-19-appears mild-stable for close monitoring/follow-up.  Troponin elevation: Trend is flat-not consistent with ACS-likely demand ischemia due to severe hypoxemia/AKI-echo with preserved EF-no wall motion abnormality.  Doubt further work-up required while inpatient.  HTN: BP slowly creeping up-start low-dose  amlodipine-hold losartan due to AKI.  Follow and optimize.   HLD: Cautiously continue with statin-watch LFTs.  Palliative care discussion: Long discussion with patient at bedside on 10/27-subsequently spoke with his daughter Maureen Ralphs over the phone-patient does not desire aggressive care including intubation/CPR if he were to deteriorate-daughter understands his wishes-has spoken to the patient about this.  DNR order placed on 10/27-both patient and family aware that if he deteriorates or starts suffering/exhibiting severe respiratory distress-apart from transitioning him to comfort measures-really no other options.  He appears to have severe COVID-19 pneumonia and remains at significant risk for further deterioration  GI prophylaxis: PPI  ABG: No results found for: PHART, PCO2ART, PO2ART, HCO3, TCO2, ACIDBASEDEF, O2SAT  Vent Settings: N/A FiO2 (%):  [100 %] 100 %  Condition - Extremely Guarded  Family Communication  : Daughter Maureen Ralphs [phone]) updated over the phone on 10/29  Code Status:DNR  Diet :  Diet Order            Diet Heart Room service appropriate? Yes; Fluid consistency: Thin  Diet effective now                  Disposition Plan  :   Status is: Inpatient  Remains inpatient appropriate because:Inpatient level of care appropriate due to severity of illness   Dispo: The patient is from: Home              Anticipated d/c is to: TBD              Anticipated d/c date is: > 3 days              Patient currently is not medically stable to d/c.   Barriers to discharge: Hypoxia requiring O2 supplementation/complete 5 days of IV Remdesivir  Antimicorbials  :    Anti-infectives (From admission, onward)   Start     Dose/Rate Route Frequency Ordered Stop   09/13/20 1000  remdesivir 100 mg in sodium chloride 0.9 % 100 mL IVPB       "Followed by" Linked Group Details   100 mg 200 mL/hr over 30 Minutes Intravenous Daily 09/12/20 1509 09/17/20 0959   09/12/20 1630   remdesivir 200 mg in sodium chloride  0.9% 250 mL IVPB       "Followed by" Linked Group Details   200 mg 580 mL/hr over 30 Minutes Intravenous Once 09/12/20 1509 09/13/20 0915      Inpatient Medications  Scheduled Meds: . amLODipine  2.5 mg Oral Daily  . atorvastatin  10 mg Oral Daily  . enoxaparin (LOVENOX) injection  40 mg Subcutaneous Q12H  . methylPREDNISolone (SOLU-MEDROL) injection  60 mg Intravenous Q12H  . pantoprazole  40 mg Oral Q1200  . sodium chloride flush  3 mL Intravenous Q12H   Continuous Infusions: . remdesivir 100 mg in NS 100 mL 100 mg (09/15/20 0823)   PRN Meds:.acetaminophen, albuterol, chlorpheniramine-HYDROcodone, guaiFENesin-dextromethorphan, ondansetron (ZOFRAN) IV, oxymetazoline, polyethylene glycol, sodium chloride   Time Spent in minutes  35   See all Orders from today for further details   Jeoffrey Massed M.D on 09/15/2020 at 11:54 AM  To page go to [email or web address] - use universal password  [facility] -  Office  [phone]    Objective:   Vitals:   09/15/20 0758 09/15/20 0800 09/15/20 1033 09/15/20 1140  BP: (!) 149/81   137/68  Pulse: (!) 101   (!) 106  Resp: 19   20  Temp: 98 F (36.7 C)   97.6 F (36.4 C)  TempSrc: Oral   Oral  SpO2: 94% 96% 92% 96%  Weight:      Height:        Wt Readings from Last 3 Encounters:  09/12/20 65.8 kg     Intake/Output Summary (Last 24 hours) at 09/15/2020 1154 Last data filed at 09/15/2020 0981 Gross per 24 hour  Intake 534.4 ml  Output 850 ml  Net -315.6 ml     Physical Exam Gen Exam:Alert awake-not in any distress HEENT:atraumatic, normocephalic Chest: B/L clear to auscultation anteriorly CVS:S1S2 regular Abdomen:soft non tender, non distended Extremities:no edema Neurology: Non focal Skin: no rash   Data Review:    CBC Recent Labs  Lab 09/12/20 1341 09/13/20 0121 09/14/20 0106 09/15/20 0119  WBC 15.3* 14.0* 25.8* 28.4*  HGB 12.0* 12.7* 13.6 14.3  HCT 33.8*  35.6* 38.1* 40.2  PLT 207 211 316 400  MCV 88.7 86.8 87.2 88.0  MCH 31.5 31.0 31.1 31.3  MCHC 35.5 35.7 35.7 35.6  RDW 12.6 12.6 12.8 13.0  LYMPHSABS 0.2* 0.2* 0.3* 0.0*  MONOABS 0.7 0.5 0.9 0.3  EOSABS 0.0 0.0 0.0 0.0  BASOSABS 0.0 0.0 0.1 0.0    Chemistries  Recent Labs  Lab 09/12/20 1341 09/12/20 2115 09/13/20 0121 09/13/20 0121 09/13/20 0745 09/13/20 1544 09/14/20 0106 09/14/20 1635 09/15/20 0119  NA 120*   < > 124*   < > 126* 125* 126* 128* 129*  K 4.4   < > 4.3   < > 4.2 4.1 4.4 3.9 4.0  CL 89*   < > 94*   < > 94* 92* 95* 95* 95*  CO2 20*   < > 20*   < > 20* 20* 21* 18* 19*  GLUCOSE 186*   < > 174*   < > 142* 213* 184* 228* 177*  BUN 17   < > 13   < > 15 24* 32* 41* 41*  CREATININE 1.47*   < > 0.96   < > 0.90 1.34* 1.58* 1.67* 1.66*  CALCIUM 8.2*   < > 8.3*   < > 8.5* 9.1 8.8* 9.1 9.0  MG  --   --  1.9  --   --   --   --   --   --  AST 87*  --  80*  --   --   --  84*  --  87*  ALT 52*  --  48*  --   --   --  56*  --  59*  ALKPHOS 83  --  88  --   --   --  102  --  113  BILITOT 1.7*  --  1.4*  --   --   --  0.9  --  1.0   < > = values in this interval not displayed.   ------------------------------------------------------------------------------------------------------------------ Recent Labs    09/12/20 1341  TRIG 58    No results found for: HGBA1C ------------------------------------------------------------------------------------------------------------------ No results for input(s): TSH, T4TOTAL, T3FREE, THYROIDAB in the last 72 hours.  Invalid input(s): FREET3 ------------------------------------------------------------------------------------------------------------------ Recent Labs    09/14/20 0106 09/15/20 0119  FERRITIN 1,169* 1,351*    Coagulation profile No results for input(s): INR, PROTIME in the last 168 hours.  Recent Labs    09/14/20 0106 09/15/20 0119  DDIMER 3.87* 2.73*    Cardiac Enzymes No results for input(s): CKMB,  TROPONINI, MYOGLOBIN in the last 168 hours.  Invalid input(s): CK ------------------------------------------------------------------------------------------------------------------    Component Value Date/Time   BNP 240.6 (H) 09/12/2020 1342    Micro Results Recent Results (from the past 240 hour(s))  Blood Culture (routine x 2)     Status: None (Preliminary result)   Collection Time: 09/12/20  2:44 PM   Specimen: BLOOD LEFT ARM  Result Value Ref Range Status   Specimen Description BLOOD LEFT ARM  Final   Special Requests   Final    BOTTLES DRAWN AEROBIC AND ANAEROBIC Blood Culture results may not be optimal due to an inadequate volume of blood received in culture bottles   Culture   Final    NO GROWTH 2 DAYS Performed at [facility], [address] [place], [place] [address]    Report Status PENDING  Incomplete  Blood Culture (routine x 2)     Status: None (Preliminary result)   Collection Time: 09/12/20  3:00 PM   Specimen: BLOOD RIGHT ARM  Result Value Ref Range Status   Specimen Description BLOOD RIGHT ARM  Final   Special Requests   Final    BOTTLES DRAWN AEROBIC AND ANAEROBIC Blood Culture adequate volume   Culture   Final    NO GROWTH 2 DAYS Performed at [facility], [address], [place], [place] [address]    Report Status PENDING  Incomplete  Respiratory Panel by RT PCR (Flu A&B, Covid) - Nasopharyngeal Swab     Status: Abnormal   Collection Time: 09/12/20  3:08 PM   Specimen: Nasopharyngeal Swab  Result Value Ref Range Status   SARS Coronavirus 2 by RT PCR POSITIVE (A) NEGATIVE Final    Comment: emailed L. Berdik RN 17:00 09/12/20 ([email or web address]) (NOTE) SARS-CoV-2 target nucleic acids are DETECTED.  SARS-CoV-2 RNA is generally detectable in upper respiratory specimens  during the acute phase of infection. Positive results are indicative of the presence of the identified virus, but do not rule out bacterial infection or co-infection with other  pathogens not detected by the test. Clinical correlation with patient history and other diagnostic information is necessary to determine patient infection status. The expected result is Negative.  Fact Sheet for Patients:  https://www.moore.com/  Fact Sheet for Healthcare Providers: https://www.young.biz/  This test is not yet approved or cleared by the [place] FDA and  has been authorized for detection  and/or diagnosis of SARS-CoV-2 by FDA under an Emergency Use Authorization (EUA).  This EUA will remain in effect (meaning this test can be used) for the duration of  the COVID-19  declaration under Section 564(b)(1) of the Act, 21 U.S.C. section 360bbb-3(b)(1), unless the authorization is terminated or revoked sooner.      Influenza A by PCR NEGATIVE NEGATIVE Final   Influenza B by PCR NEGATIVE NEGATIVE Final    Comment: (NOTE) The Xpert Xpress SARS-CoV-2/FLU/RSV assay is intended as an aid in  the diagnosis of influenza from Nasopharyngeal swab specimens and  should not be used as a sole basis for treatment. Nasal washings and  aspirates are unacceptable for Xpert Xpress SARS-CoV-2/FLU/RSV  testing.  Fact Sheet for Patients: https://www.moore.com/  Fact Sheet for Healthcare Providers: https://www.young.biz/  This test is not yet approved or cleared by the [place] FDA and  has been authorized for detection and/or diagnosis of SARS-CoV-2 by  FDA under an Emergency Use Authorization (EUA). This EUA will remain  in effect (meaning this test can be used) for the duration of the  Covid-19 declaration under Section 564(b)(1) of the Act, 21  U.S.C. section 360bbb-3(b)(1), unless the authorization is  terminated or revoked. Performed at [facility], [address], [place], [place] [address]     Radiology Reports CT Angio Chest PE W and/or Wo Contrast  Result Date:  09/12/2020 CLINICAL DATA:  79 year old male with elevated D-dimer. Concern for pulmonary embolism. EXAM: CT ANGIOGRAPHY CHEST WITH CONTRAST TECHNIQUE: Multidetector CT imaging of the chest was performed using the standard protocol during bolus administration of intravenous contrast. Multiplanar CT image reconstructions and MIPs were obtained to evaluate the vascular anatomy. CONTRAST:  75mL OMNIPAQUE IOHEXOL 350 MG/ML SOLN COMPARISON:  Chest radiograph dated 09/12/2020. FINDINGS: Evaluation of this exam is limited due to respiratory motion artifact. Cardiovascular: There is no cardiomegaly or pericardial effusion. There is 3 vessel coronary vascular calcification. Moderate atherosclerotic calcification of the thoracic aorta. No aneurysmal dilatation or dissection. The origins of the great vessels of the aortic arch appear patent as visualized. Evaluation of the pulmonary arteries is limited due to respiratory motion artifact as well as streak artifact caused by patient's arms. No large or central pulmonary artery embolus identified. Mediastinum/Nodes: No hilar or mediastinal adenopathy. The esophagus and the thyroid gland are grossly unremarkable. No mediastinal fluid collection. Lungs/Pleura: Extensive patchy bilateral pulmonary opacities with predominant involvement of the lower lobes most consistent with multifocal pneumonia, likely viral or atypical in etiology including COVID-19. Clinical correlation is recommended. There are small bilateral pleural effusions. No pneumothorax. The central airways are patent. Upper Abdomen: No acute abnormality. Musculoskeletal: Degenerative changes of the spine. No acute osseous pathology. Review of the MIP images confirms the above findings. IMPRESSION: 1. No CT evidence of central pulmonary artery embolus. 2. Multifocal pneumonia, likely viral or atypical in etiology. Clinical correlation and follow-up to resolution recommended. 3. Small bilateral pleural effusions. 4.  Aortic Atherosclerosis (ICD10-I70.0). Electronically Signed   By: Elgie Collard M.D.   On: 09/12/2020 19:05   DG Chest Port 1 View  Result Date: 09/12/2020 CLINICAL DATA:  Shortness of breath, COVID-19 positive EXAM: PORTABLE CHEST 1 VIEW COMPARISON:  10/07/2008 FINDINGS: Heart size within normal limits. Aortic atherosclerosis. There are diffuse interstitial opacities throughout the left lung, most confluent within the left lung base. Mild interstitial prominence within the right lung. No large pleural fluid collection. No pneumothorax. IMPRESSION: Diffuse interstitial opacities throughout the left lung, most confluent within  the left lung base. Findings concerning for multifocal atypical/viral infection versus asymmetric edema. Electronically Signed   By: Duanne Guess D.O.   On: 09/12/2020 14:59   DG Chest Port 1V same Day  Result Date: 09/15/2020 CLINICAL DATA:  Shortness of breath.  COVID-19 positive EXAM: PORTABLE CHEST 1 VIEW COMPARISON:  Chest radiograph and chest CT September 12, 2020 FINDINGS: There is patchy airspace opacity throughout the lungs bilaterally, similar to recent prior studies. No consolidation. Heart size and pulmonary vascularity within normal limits. No adenopathy. There is aortic atherosclerosis. No bone lesions IMPRESSION: Persistent multifocal airspace opacity consistent with atypical organism pneumonia. No appreciable new opacity compared to recent studies. Stable cardiac silhouette. Aortic Atherosclerosis (ICD10-I70.0). Electronically Signed   By: Bretta Bang III M.D.   On: 09/15/2020 07:43   ECHOCARDIOGRAM COMPLETE  Result Date: 09/14/2020    ECHOCARDIOGRAM REPORT   Patient Name:   GAYNOR FERRERAS Date of Exam: 09/13/2020 Medical Rec #:  [id number]       Height:       65.0 in Accession #:    [id number]      Weight:       145.0 lb Date of Birth:  September 24, 1941       BSA:          1.725 m Patient Age:    79 years        BP:           141/76 mmHg Patient Gender: M                HR:           101 bpm. Exam Location:  Inpatient Procedure: 2D Echo, Cardiac Doppler and Color Doppler Indications:    Acute Respiratory Failure 518.82 / R06.89  History:        Patient has no prior history of Echocardiogram examinations.                 Risk Factors:Hypertension. Covid -19 Positive.  Sonographer:    Tiffany Dance Referring Phys: Jeoffrey Massed, M IMPRESSIONS  1. Left ventricular ejection fraction, by estimation, is 55 to 60%. The left ventricle has normal function. The left ventricle has no regional wall motion abnormalities. There is mild concentric left ventricular hypertrophy. Left ventricular diastolic parameters were normal.  2. Right ventricular systolic function is normal. The right ventricular size is normal. There is normal pulmonary artery systolic pressure. The estimated right ventricular systolic pressure is 24.5 mmHg.  3. A small pericardial effusion is present. There is no evidence of cardiac tamponade.  4. The mitral valve is normal in structure. Trivial mitral valve regurgitation. No evidence of mitral stenosis.  5. The aortic valve is tricuspid. There is mild calcification of the aortic valve. Aortic valve regurgitation is not visualized. Mild aortic valve sclerosis is present, with no evidence of aortic valve stenosis.  6. The inferior vena cava is normal in size with greater than 50% respiratory variability, suggesting right atrial pressure of 3 mmHg. Comparison(s): No prior Echocardiogram. Conclusion(s)/Recommendation(s): Otherwise normal echocardiogram, with minor abnormalities described in the report. FINDINGS  Left Ventricle: Left ventricular ejection fraction, by estimation, is 55 to 60%. The left ventricle has normal function. The left ventricle has no regional wall motion abnormalities. The left ventricular internal cavity size was normal in size. There is  mild concentric left ventricular hypertrophy. Left ventricular diastolic parameters were normal. Right  Ventricle: The right ventricular size is normal. No increase in right ventricular wall thickness.  Right ventricular systolic function is normal. There is normal pulmonary artery systolic pressure. The tricuspid regurgitant velocity is 2.32 m/s, and  with an assumed right atrial pressure of 3 mmHg, the estimated right ventricular systolic pressure is 24.5 mmHg. Left Atrium: Left atrial size was normal in size. Right Atrium: Right atrial size was normal in size. Pericardium: A small pericardial effusion is present. There is no evidence of cardiac tamponade. Mitral Valve: The mitral valve is normal in structure. Trivial mitral valve regurgitation. No evidence of mitral valve stenosis. Tricuspid Valve: The tricuspid valve is normal in structure. Tricuspid valve regurgitation is trivial. No evidence of tricuspid stenosis. Aortic Valve: The aortic valve is tricuspid. There is mild calcification of the aortic valve. Aortic valve regurgitation is not visualized. Mild aortic valve sclerosis is present, with no evidence of aortic valve stenosis. Pulmonic Valve: The pulmonic valve was not well visualized. Pulmonic valve regurgitation is not visualized. No evidence of pulmonic stenosis. Aorta: The aortic root, ascending aorta and aortic arch are all structurally normal, with no evidence of dilitation or obstruction. Venous: The inferior vena cava is normal in size with greater than 50% respiratory variability, suggesting right atrial pressure of 3 mmHg. IAS/Shunts: No atrial level shunt detected by color flow Doppler.  LEFT VENTRICLE PLAX 2D LVIDd:         4.00 cm LVIDs:         2.77 cm LV PW:         1.24 cm LV IVS:        1.16 cm LVOT diam:     2.00 cm LV SV:         51 LV SV Index:   30 LVOT Area:     3.14 cm  RIGHT VENTRICLE          IVC RV Basal diam:  2.58 cm  IVC diam: 1.42 cm TAPSE (M-mode): 2.4 cm LEFT ATRIUM             Index       RIGHT ATRIUM           Index LA diam:        3.30 cm 1.91 cm/m  RA Area:     12.00  cm LA Vol (A2C):   41.1 ml 23.82 ml/m RA Volume:   23.40 ml  13.56 ml/m LA Vol (A4C):   34.8 ml 20.17 ml/m LA Biplane Vol: 40.1 ml 23.24 ml/m  AORTIC VALVE LVOT Vmax:   92.15 cm/s LVOT Vmean:  59.400 cm/s LVOT VTI:    0.163 m  AORTA Ao Root diam: 3.50 cm Ao Asc diam:  3.60 cm MITRAL VALVE               TRICUSPID VALVE MV Area (PHT): 2.76 cm    TR Peak grad:   21.5 mmHg MV Decel Time: 275 msec    TR Vmax:        232.00 cm/s MV E velocity: 60.60 cm/s MV A velocity: 85.70 cm/s  SHUNTS MV E/A ratio:  0.71        Systemic VTI:  0.16 m                            Systemic Diam: 2.00 cm Jodelle Red MD Electronically signed by Jodelle Red MD Signature Date/Time: 09/14/2020/8:03:57 AM    Final    VAS [place] LOWER EXTREMITY VENOUS (DVT)  Result Date: 09/13/2020  Lower Venous DVTStudy Indications: Elevated  Ddimer.  Risk Factors: COVID 19 positive. Comparison Study: No prior studies. Performing Technologist: Chanda Busing RVT  Examination Guidelines: A complete evaluation includes B-mode imaging, spectral Doppler, color Doppler, and power Doppler as needed of all accessible portions of each vessel. Bilateral testing is considered an integral part of a complete examination. Limited examinations for reoccurring indications may be performed as noted. The reflux portion of the exam is performed with the patient in reverse Trendelenburg.  +---------+---------------+---------+-----------+----------+--------------+ RIGHT    CompressibilityPhasicitySpontaneityPropertiesThrombus Aging +---------+---------------+---------+-----------+----------+--------------+ CFV      Full           Yes      Yes                                 +---------+---------------+---------+-----------+----------+--------------+ SFJ      Full                                                        +---------+---------------+---------+-----------+----------+--------------+ FV Prox  Full                                                         +---------+---------------+---------+-----------+----------+--------------+ FV Mid   Full                                                        +---------+---------------+---------+-----------+----------+--------------+ FV DistalFull                                                        +---------+---------------+---------+-----------+----------+--------------+ PFV      Full                                                        +---------+---------------+---------+-----------+----------+--------------+ POP      Full           Yes      Yes                                 +---------+---------------+---------+-----------+----------+--------------+ PTV      Full                                                        +---------+---------------+---------+-----------+----------+--------------+ PERO     Full                                                        +---------+---------------+---------+-----------+----------+--------------+   +---------+---------------+---------+-----------+----------+--------------+  LEFT     CompressibilityPhasicitySpontaneityPropertiesThrombus Aging +---------+---------------+---------+-----------+----------+--------------+ CFV      Full           Yes      Yes                                 +---------+---------------+---------+-----------+----------+--------------+ SFJ      Full                                                        +---------+---------------+---------+-----------+----------+--------------+ FV Prox  Full                                                        +---------+---------------+---------+-----------+----------+--------------+ FV Mid   Full                                                        +---------+---------------+---------+-----------+----------+--------------+ FV DistalFull                                                         +---------+---------------+---------+-----------+----------+--------------+ PFV      Full                                                        +---------+---------------+---------+-----------+----------+--------------+ POP      Full           Yes      Yes                                 +---------+---------------+---------+-----------+----------+--------------+ PTV      Full                                                        +---------+---------------+---------+-----------+----------+--------------+ PERO     Full                                                        +---------+---------------+---------+-----------+----------+--------------+     Summary: RIGHT: - There is no evidence of deep vein thrombosis in the lower extremity.  - No cystic structure found in the popliteal fossa.  LEFT: - There is no evidence of deep vein thrombosis in the lower extremity.  - No  cystic structure found in the popliteal fossa.  *See table(s) above for measurements and observations. Electronically signed by Coral ElseVance Brabham MD on 09/13/2020 at 8:53:51 PM.    Final

## 2020-09-15 NOTE — Plan of Care (Signed)
°  Problem: Education: Goal: Knowledge of risk factors and measures for prevention of condition will improve Outcome: Progressing   Problem: Coping: Goal: Psychosocial and spiritual needs will be supported Outcome: Progressing   Problem: Respiratory: Goal: Will maintain a patent airway Outcome: Progressing Goal: Complications related to the disease process, condition or treatment will be avoided or minimized Outcome: Progressing   Problem: Clinical Measurements: Goal: Ability to maintain clinical measurements within normal limits will improve Outcome: Progressing Goal: Respiratory complications will improve Outcome: Progressing   Problem: Nutrition: Goal: Adequate nutrition will be maintained Outcome: Progressing   Problem: Elimination: Goal: Will not experience complications related to bowel motility Outcome: Progressing Goal: Will not experience complications related to urinary retention Outcome: Progressing   Problem: Safety: Goal: Ability to remain free from injury will improve Outcome: Progressing   Problem: Skin Integrity: Goal: Risk for impaired skin integrity will decrease Outcome: Progressing   Problem: Education: Goal: Knowledge of risk factors and measures for prevention of condition will improve Outcome: Progressing   Problem: Coping: Goal: Psychosocial and spiritual needs will be supported Outcome: Progressing   Problem: Respiratory: Goal: Will maintain a patent airway Outcome: Progressing Goal: Complications related to the disease process, condition or treatment will be avoided or minimized Outcome: Progressing   

## 2020-09-15 NOTE — Progress Notes (Addendum)
   09/14/20 2038  Assess: MEWS Score  Temp 97.7 F (36.5 C)  BP (!) 159/87  Pulse Rate (!) 107  ECG Heart Rate (!) 107  Resp (!) 21  SpO2 94 %  Assess: MEWS Score  MEWS Temp 0  MEWS Systolic 0  MEWS Pulse 1  MEWS RR 1  MEWS LOC 0  MEWS Score 2  MEWS Score Color Yellow  Assess: if the MEWS score is Yellow or Red  Were vital signs taken at a resting state? Yes  Focused Assessment No change from prior assessment  Early Detection of Sepsis Score *See Row Information* Low  MEWS guidelines implemented *See Row Information* Yes  Treat  MEWS Interventions Other (Comment) (relaxation terchniques)  Take Vital Signs  Increase Vital Sign Frequency  Yellow: Q 2hr X 2 then Q 4hr X 2, if remains yellow, continue Q 4hrs  Escalate  MEWS: Escalate Yellow: discuss with charge nurse/RN and consider discussing with provider and RRT  Notify: Charge Nurse/RN  Name of Charge Nurse/RN Notified Nicole, RN  Date Charge Nurse/RN Notified 09/14/20  Time Charge Nurse/RN Notified 2100  Document  Patient Outcome Other (Comment) (no interventions needed)

## 2020-09-16 DIAGNOSIS — N179 Acute kidney failure, unspecified: Secondary | ICD-10-CM | POA: Diagnosis not present

## 2020-09-16 DIAGNOSIS — I248 Other forms of acute ischemic heart disease: Secondary | ICD-10-CM | POA: Diagnosis not present

## 2020-09-16 DIAGNOSIS — U071 COVID-19: Secondary | ICD-10-CM | POA: Diagnosis not present

## 2020-09-16 DIAGNOSIS — J9601 Acute respiratory failure with hypoxia: Secondary | ICD-10-CM | POA: Diagnosis not present

## 2020-09-16 LAB — COMPREHENSIVE METABOLIC PANEL
ALT: 63 U/L — ABNORMAL HIGH (ref 0–44)
AST: 80 U/L — ABNORMAL HIGH (ref 15–41)
Albumin: 2.7 g/dL — ABNORMAL LOW (ref 3.5–5.0)
Alkaline Phosphatase: 111 U/L (ref 38–126)
Anion gap: 12 (ref 5–15)
BUN: 38 mg/dL — ABNORMAL HIGH (ref 8–23)
CO2: 22 mmol/L (ref 22–32)
Calcium: 9 mg/dL (ref 8.9–10.3)
Chloride: 97 mmol/L — ABNORMAL LOW (ref 98–111)
Creatinine, Ser: 1.54 mg/dL — ABNORMAL HIGH (ref 0.61–1.24)
GFR, Estimated: 45 mL/min — ABNORMAL LOW (ref >60)
Glucose, Bld: 198 mg/dL — ABNORMAL HIGH (ref 70–99)
Potassium: 4.2 mmol/L (ref 3.5–5.1)
Sodium: 131 mmol/L — ABNORMAL LOW (ref 135–145)
Total Bilirubin: 1.1 mg/dL (ref 0.3–1.2)
Total Protein: 5.7 g/dL — ABNORMAL LOW (ref 6.5–8.1)

## 2020-09-16 LAB — CBC WITH DIFFERENTIAL/PLATELET
Abs Immature Granulocytes: 0.26 10*3/uL — ABNORMAL HIGH (ref 0.00–0.07)
Basophils Absolute: 0 10*3/uL (ref 0.0–0.1)
Basophils Relative: 0 %
Eosinophils Absolute: 0 10*3/uL (ref 0.0–0.5)
Eosinophils Relative: 0 %
HCT: 39.7 % (ref 39.0–52.0)
Hemoglobin: 13.7 g/dL (ref 13.0–17.0)
Immature Granulocytes: 1 %
Lymphocytes Relative: 2 %
Lymphs Abs: 0.5 10*3/uL — ABNORMAL LOW (ref 0.7–4.0)
MCH: 30.7 pg (ref 26.0–34.0)
MCHC: 34.5 g/dL (ref 30.0–36.0)
MCV: 89 fL (ref 80.0–100.0)
Monocytes Absolute: 0.8 10*3/uL (ref 0.1–1.0)
Monocytes Relative: 3 %
Neutro Abs: 25.6 10*3/uL — ABNORMAL HIGH (ref 1.7–7.7)
Neutrophils Relative %: 94 %
Platelets: 352 10*3/uL (ref 150–400)
RBC: 4.46 MIL/uL (ref 4.22–5.81)
RDW: 12.9 % (ref 11.5–15.5)
WBC: 27.2 10*3/uL — ABNORMAL HIGH (ref 4.0–10.5)
nRBC: 0 % (ref 0.0–0.2)

## 2020-09-16 LAB — C-REACTIVE PROTEIN: CRP: 2.9 mg/dL — ABNORMAL HIGH (ref <1.0)

## 2020-09-16 LAB — D-DIMER, QUANTITATIVE: D-Dimer, Quant: 1.86 ug/mL-FEU — ABNORMAL HIGH (ref 0.00–0.50)

## 2020-09-16 LAB — FERRITIN: Ferritin: 1283 ng/mL — ABNORMAL HIGH (ref 24–336)

## 2020-09-16 NOTE — Progress Notes (Signed)
PROGRESS NOTE                                                                                                                                                                                                             Patient Demographics:    Larry Olsen, is a 79 y.o. male, DOB - [birth year], XIP:[id number]  Outpatient Primary MD for the patient is Patient, No Pcp Per   Admit date - 09/12/2020   LOS - 4  Chief Complaint  Patient presents with  . Shortness of Breath  . Covid Positive       Brief Narrative: Patient is a 79 y.o. male with PMHx of HTN, HLD-known Covid 19+ approximately 2 weeks prior to this hospital stay-presenting with severe shortness of breath-found to have severe acute hypoxemic respiratory failure requiring 15 L of HFNC in the setting of COVID-19 pneumonia.  See below for further details  COVID-19 vaccinated status: Unvaccinated  Significant Events: 10/26>> Admit to [facility] for severe hypoxia due to COVID-19 pneumonia, AKI and hyponatremia.  Significant studies: 10/26>>Chest x-ray: Diffuse interstitial airspace opacities 10/26>> CTA chest: No PE, multifocal pneumonia 10/27>> bilateral lower extremity Doppler: No DVT 10/27>> Echo: EF 55-60%, no wall motion abnormalities, small pericardial effusion  COVID-19 medications: Steroids: 10/26>> Remdesivir: 10/26>> Actemra: 10/26 x 1  Antibiotics: None  Microbiology data: 10/26 >>blood culture: No growth  Procedures: None  Consults: None  DVT prophylaxis: Prophylactic Lovenox at intermediate dosing   Subjective:   Still with severe exertional dyspnea with minimal activity-at rest he appears stable.  Patient letting me know this morning that he really does not want to continue like this indefinitely-wants to give it a few days-before he "pulls the plug".  He does not want his family to suffer-and have "them pull the plug"   Assessment  & Plan  :   Acute Hypoxic Resp Failure due to Covid 19 Viral pneumonia: Remains unchanged over the past several days-severe hypoxemia continues-he continues to remain very tenuous-he remains on heated high flow.  No evidence of volume overload-continue steroid/Remdesivir.  DNR in place-patient very clear that he does not desire aggressive care-he also let me know today that he does not intend to continue indefinitely with current plan of care-if he does not improve in the next few days-he expressed desire that we transition him to comfort care.  He continues to have severe symptoms with minimal activity-but at rest he appears stable.  We will go ahead and start some low-dose Roxanol for comfort-and severe dyspnea.  We will continue to follow clinical course closely-continue to engage with family and patient.  Fever: afebrile O2 requirements:  SpO2: 92 % O2 Flow Rate (L/min): (S) 25 L/min FiO2 (%): (S) 80 %   COVID-19 Labs: Recent Labs    09/14/20 0106 09/15/20 0119 09/16/20 0202  DDIMER 3.87* 2.73* 1.86*  FERRITIN 1,169* 1,351* 1,283*  CRP 11.9* 6.3* 2.9*       Component Value Date/Time   BNP 240.6 (H) 09/12/2020 1342    Recent Labs  Lab 09/12/20 1341  PROCALCITON 0.72    Lab Results  Component Value Date   SARSCOV2NAA POSITIVE (A) 09/12/2020    Prone/Incentive Spirometry: encouraged patient to lie prone for 3-4 hours at a time for a total of 16 hours a day, and to encourage incentive spirometry use 3-4/hour.  Elevated D-dimer: Secondary to COVID-19-CTA chest negative for PE/lower extremity Dopplers negative for DVT-continue intermediate dosing of Lovenox.  Follow.    AKI: Likely hemodynamically mediated-had resolved on 10/27-but then again worsened-no slowly downtrending with supportive care.  Avoid nephrotoxic agents.  Hyponatremia: Secondary to hypovolemia/dehydration-improved after gentle hydration.  Volume status remains stable.  Transaminitis: Secondary to COVID-19-appears  mild-stable for close monitoring/follow-up.  Troponin elevation: Trend is flat-not consistent with ACS-likely demand ischemia due to severe hypoxemia/AKI-echo with preserved EF-no wall motion abnormality.  Doubt further work-up required while inpatient.  HTN: BP slowly creeping up-start low-dose amlodipine-hold losartan due to AKI.  Follow and optimize.   HLD: Cautiously continue with statin-watch LFTs.  Palliative care discussion: Long discussion with patient at bedside on 10/27-subsequently spoke with his daughter Maureen Ralphs over the phone-patient does not desire aggressive care including intubation/CPR if he were to deteriorate-daughter understands his wishes-has spoken to the patient about this.  DNR order placed on 10/27-both patient and family aware that if he deteriorates or starts suffering/exhibiting severe respiratory distress-apart from transitioning him to comfort measures-really no other options.  He appears to have severe COVID-19 pneumonia and remains at significant risk for further deterioration  GI prophylaxis: PPI  ABG: No results found for: PHART, PCO2ART, PO2ART, HCO3, TCO2, ACIDBASEDEF, O2SAT  Vent Settings: N/A FiO2 (%):  [80 %-100 %] 80 %  Condition - Extremely Guarded  Family Communication  : Daughter Maureen Ralphs [phone]) updated over the phone on 10/29  Code Status:DNR  Diet :  Diet Order            Diet Heart Room service appropriate? Yes; Fluid consistency: Thin  Diet effective now                  Disposition Plan  :   Status is: Inpatient  Remains inpatient appropriate because:Inpatient level of care appropriate due to severity of illness   Dispo: The patient is from: Home              Anticipated d/c is to: TBD              Anticipated d/c date is: > 3 days              Patient currently is not medically stable to d/c.   Barriers to discharge: Hypoxia requiring O2 supplementation/complete 5 days of IV Remdesivir  Antimicorbials  :     Anti-infectives (From admission, onward)   Start     Dose/Rate Route Frequency Ordered Stop  09/13/20 1000  remdesivir 100 mg in sodium chloride 0.9 % 100 mL IVPB       "Followed by" Linked Group Details   100 mg 200 mL/hr over 30 Minutes Intravenous Daily 09/12/20 1509 09/16/20 0823   09/12/20 1630  remdesivir 200 mg in sodium chloride 0.9% 250 mL IVPB       "Followed by" Linked Group Details   200 mg 580 mL/hr over 30 Minutes Intravenous Once 09/12/20 1509 09/13/20 0915      Inpatient Medications  Scheduled Meds: . amLODipine  2.5 mg Oral Daily  . atorvastatin  10 mg Oral Daily  . enoxaparin (LOVENOX) injection  40 mg Subcutaneous Q12H  . methylPREDNISolone (SOLU-MEDROL) injection  60 mg Intravenous Q12H  . pantoprazole  40 mg Oral Q1200  . sodium chloride flush  3 mL Intravenous Q12H   Continuous Infusions:  PRN Meds:.acetaminophen, albuterol, chlorpheniramine-HYDROcodone, guaiFENesin-dextromethorphan, ondansetron (ZOFRAN) IV, oxymetazoline, polyethylene glycol, sodium chloride   Time Spent in minutes  35   See all Orders from today for further details   Jeoffrey Massed M.D on 09/16/2020 at 2:34 PM  To page go to [email or web address] - use universal password  [facility] -  Office  [phone]    Objective:   Vitals:   09/16/20 0919 09/16/20 1013 09/16/20 1123 09/16/20 1323  BP: (!) 164/91 (!) 149/74 125/77 (!) 149/83  Pulse: (!) 112 (!) 116 (!) 110 (!) 115  Resp: (!) 35 (!) 36 (!) 34 (!) 32  Temp: (!) 96.8 F (36 C) (!) 97.3 F (36.3 C) (!) 97.1 F (36.2 C)   TempSrc: Axillary Axillary Axillary   SpO2: 93% 93% (!) 89% 92%  Weight:      Height:        Wt Readings from Last 3 Encounters:  09/12/20 65.8 kg     Intake/Output Summary (Last 24 hours) at 09/16/2020 1434 Last data filed at 09/16/2020 0749 Gross per 24 hour  Intake 240 ml  Output 950 ml  Net -710 ml     Physical Exam Gen Exam:Alert awake-not in any distress HEENT:atraumatic,  normocephalic Chest: B/L clear to auscultation anteriorly CVS:S1S2 regular Abdomen:soft non tender, non distended Extremities:no edema Neurology: Non focal Skin: no rash   Data Review:    CBC Recent Labs  Lab 09/12/20 1341 09/13/20 0121 09/14/20 0106 09/15/20 0119 09/16/20 0202  WBC 15.3* 14.0* 25.8* 28.4* 27.2*  HGB 12.0* 12.7* 13.6 14.3 13.7  HCT 33.8* 35.6* 38.1* 40.2 39.7  PLT 207 211 316 400 352  MCV 88.7 86.8 87.2 88.0 89.0  MCH 31.5 31.0 31.1 31.3 30.7  MCHC 35.5 35.7 35.7 35.6 34.5  RDW 12.6 12.6 12.8 13.0 12.9  LYMPHSABS 0.2* 0.2* 0.3* 0.0* 0.5*  MONOABS 0.7 0.5 0.9 0.3 0.8  EOSABS 0.0 0.0 0.0 0.0 0.0  BASOSABS 0.0 0.0 0.1 0.0 0.0    Chemistries  Recent Labs  Lab 09/12/20 1341 09/12/20 2115 09/13/20 0121 09/13/20 0745 09/13/20 1544 09/14/20 0106 09/14/20 1635 09/15/20 0119 09/16/20 0202  NA 120*   < > 124*   < > 125* 126* 128* 129* 131*  K 4.4   < > 4.3   < > 4.1 4.4 3.9 4.0 4.2  CL 89*   < > 94*   < > 92* 95* 95* 95* 97*  CO2 20*   < > 20*   < > 20* 21* 18* 19* 22  GLUCOSE 186*   < > 174*   < > 213* 184* 228* 177* 198*  BUN  17   < > 13   < > 24* 32* 41* 41* 38*  CREATININE 1.47*   < > 0.96   < > 1.34* 1.58* 1.67* 1.66* 1.54*  CALCIUM 8.2*   < > 8.3*   < > 9.1 8.8* 9.1 9.0 9.0  MG  --   --  1.9  --   --   --   --   --   --   AST 87*  --  80*  --   --  84*  --  87* 80*  ALT 52*  --  48*  --   --  56*  --  59* 63*  ALKPHOS 83  --  88  --   --  102  --  113 111  BILITOT 1.7*  --  1.4*  --   --  0.9  --  1.0 1.1   < > = values in this interval not displayed.   ------------------------------------------------------------------------------------------------------------------ No results for input(s): CHOL, HDL, LDLCALC, TRIG, CHOLHDL, LDLDIRECT in the last 72 hours.  No results found for: HGBA1C ------------------------------------------------------------------------------------------------------------------ No results for input(s): TSH, T4TOTAL,  T3FREE, THYROIDAB in the last 72 hours.  Invalid input(s): FREET3 ------------------------------------------------------------------------------------------------------------------ Recent Labs    09/15/20 0119 09/16/20 0202  FERRITIN 1,351* 1,283*    Coagulation profile No results for input(s): INR, PROTIME in the last 168 hours.  Recent Labs    09/15/20 0119 09/16/20 0202  DDIMER 2.73* 1.86*    Cardiac Enzymes No results for input(s): CKMB, TROPONINI, MYOGLOBIN in the last 168 hours.  Invalid input(s): CK ------------------------------------------------------------------------------------------------------------------    Component Value Date/Time   BNP 240.6 (H) 09/12/2020 1342    Micro Results Recent Results (from the past 240 hour(s))  Blood Culture (routine x 2)     Status: None (Preliminary result)   Collection Time: 09/12/20  2:44 PM   Specimen: BLOOD LEFT ARM  Result Value Ref Range Status   Specimen Description BLOOD LEFT ARM  Final   Special Requests   Final    BOTTLES DRAWN AEROBIC AND ANAEROBIC Blood Culture results may not be optimal due to an inadequate volume of blood received in culture bottles   Culture   Final    NO GROWTH 4 DAYS Performed at [facility], [address] [place], [place] [address]    Report Status PENDING  Incomplete  Blood Culture (routine x 2)     Status: None (Preliminary result)   Collection Time: 09/12/20  3:00 PM   Specimen: BLOOD RIGHT ARM  Result Value Ref Range Status   Specimen Description BLOOD RIGHT ARM  Final   Special Requests   Final    BOTTLES DRAWN AEROBIC AND ANAEROBIC Blood Culture adequate volume   Culture   Final    NO GROWTH 4 DAYS Performed at [facility], [address], [place], [place] [address]    Report Status PENDING  Incomplete  Respiratory Panel by RT PCR (Flu A&B, Covid) - Nasopharyngeal Swab     Status: Abnormal   Collection Time: 09/12/20  3:08 PM   Specimen: Nasopharyngeal  Swab  Result Value Ref Range Status   SARS Coronavirus 2 by RT PCR POSITIVE (A) NEGATIVE Final    Comment: emailed L. Berdik RN 17:00 09/12/20 ([email or web address]) (NOTE) SARS-CoV-2 target nucleic acids are DETECTED.  SARS-CoV-2 RNA is generally detectable in upper respiratory specimens  during the acute phase of infection. Positive results are indicative of the presence of the identified virus, but  do not rule out bacterial infection or co-infection with other pathogens not detected by the test. Clinical correlation with patient history and other diagnostic information is necessary to determine patient infection status. The expected result is Negative.  Fact Sheet for Patients:  https://www.moore.com/  Fact Sheet for Healthcare Providers: https://www.young.biz/  This test is not yet approved or cleared by the [place] FDA and  has been authorized for detection and/or diagnosis of SARS-CoV-2 by FDA under an Emergency Use Authorization (EUA).  This EUA will remain in effect (meaning this test can be used) for the duration of  the COVID-19  declaration under Section 564(b)(1) of the Act, 21 U.S.C. section 360bbb-3(b)(1), unless the authorization is terminated or revoked sooner.      Influenza A by PCR NEGATIVE NEGATIVE Final   Influenza B by PCR NEGATIVE NEGATIVE Final    Comment: (NOTE) The Xpert Xpress SARS-CoV-2/FLU/RSV assay is intended as an aid in  the diagnosis of influenza from Nasopharyngeal swab specimens and  should not be used as a sole basis for treatment. Nasal washings and  aspirates are unacceptable for Xpert Xpress SARS-CoV-2/FLU/RSV  testing.  Fact Sheet for Patients: https://www.moore.com/  Fact Sheet for Healthcare Providers: https://www.young.biz/  This test is not yet approved or cleared by the [place] FDA and  has been authorized for detection and/or diagnosis of SARS-CoV-2  by  FDA under an Emergency Use Authorization (EUA). This EUA will remain  in effect (meaning this test can be used) for the duration of the  Covid-19 declaration under Section 564(b)(1) of the Act, 21  U.S.C. section 360bbb-3(b)(1), unless the authorization is  terminated or revoked. Performed at [facility], [address] [place], [place] [address]     Radiology Reports CT Angio Chest PE W and/or Wo Contrast  Result Date: 09/12/2020 CLINICAL DATA:  79 year old male with elevated D-dimer. Concern for pulmonary embolism. EXAM: CT ANGIOGRAPHY CHEST WITH CONTRAST TECHNIQUE: Multidetector CT imaging of the chest was performed using the standard protocol during bolus administration of intravenous contrast. Multiplanar CT image reconstructions and MIPs were obtained to evaluate the vascular anatomy. CONTRAST:  75mL OMNIPAQUE IOHEXOL 350 MG/ML SOLN COMPARISON:  Chest radiograph dated 09/12/2020. FINDINGS: Evaluation of this exam is limited due to respiratory motion artifact. Cardiovascular: There is no cardiomegaly or pericardial effusion. There is 3 vessel coronary vascular calcification. Moderate atherosclerotic calcification of the thoracic aorta. No aneurysmal dilatation or dissection. The origins of the great vessels of the aortic arch appear patent as visualized. Evaluation of the pulmonary arteries is limited due to respiratory motion artifact as well as streak artifact caused by patient's arms. No large or central pulmonary artery embolus identified. Mediastinum/Nodes: No hilar or mediastinal adenopathy. The esophagus and the thyroid gland are grossly unremarkable. No mediastinal fluid collection. Lungs/Pleura: Extensive patchy bilateral pulmonary opacities with predominant involvement of the lower lobes most consistent with multifocal pneumonia, likely viral or atypical in etiology including COVID-19. Clinical correlation is recommended. There are small bilateral pleural effusions. No  pneumothorax. The central airways are patent. Upper Abdomen: No acute abnormality. Musculoskeletal: Degenerative changes of the spine. No acute osseous pathology. Review of the MIP images confirms the above findings. IMPRESSION: 1. No CT evidence of central pulmonary artery embolus. 2. Multifocal pneumonia, likely viral or atypical in etiology. Clinical correlation and follow-up to resolution recommended. 3. Small bilateral pleural effusions. 4. Aortic Atherosclerosis (ICD10-I70.0). Electronically Signed   By: Elgie Collard M.D.   On: 09/12/2020 19:05   DG Chest  Port 1 View  Result Date: 09/12/2020 CLINICAL DATA:  Shortness of breath, COVID-19 positive EXAM: PORTABLE CHEST 1 VIEW COMPARISON:  10/07/2008 FINDINGS: Heart size within normal limits. Aortic atherosclerosis. There are diffuse interstitial opacities throughout the left lung, most confluent within the left lung base. Mild interstitial prominence within the right lung. No large pleural fluid collection. No pneumothorax. IMPRESSION: Diffuse interstitial opacities throughout the left lung, most confluent within the left lung base. Findings concerning for multifocal atypical/viral infection versus asymmetric edema. Electronically Signed   By: Duanne Guess D.O.   On: 09/12/2020 14:59   DG Chest Port 1V same Day  Result Date: 09/15/2020 CLINICAL DATA:  Shortness of breath.  COVID-19 positive EXAM: PORTABLE CHEST 1 VIEW COMPARISON:  Chest radiograph and chest CT September 12, 2020 FINDINGS: There is patchy airspace opacity throughout the lungs bilaterally, similar to recent prior studies. No consolidation. Heart size and pulmonary vascularity within normal limits. No adenopathy. There is aortic atherosclerosis. No bone lesions IMPRESSION: Persistent multifocal airspace opacity consistent with atypical organism pneumonia. No appreciable new opacity compared to recent studies. Stable cardiac silhouette. Aortic Atherosclerosis (ICD10-I70.0).  Electronically Signed   By: Bretta Bang III M.D.   On: 09/15/2020 07:43   ECHOCARDIOGRAM COMPLETE  Result Date: 09/14/2020    ECHOCARDIOGRAM REPORT   Patient Name:   RIVALDO HINEMAN Date of Exam: 09/13/2020 Medical Rec #:  [id number]       Height:       65.0 in Accession #:    [id number]      Weight:       145.0 lb Date of Birth:  Aug 07, 1941       BSA:          1.725 m Patient Age:    79 years        BP:           141/76 mmHg Patient Gender: M               HR:           101 bpm. Exam Location:  Inpatient Procedure: 2D Echo, Cardiac Doppler and Color Doppler Indications:    Acute Respiratory Failure 518.82 / R06.89  History:        Patient has no prior history of Echocardiogram examinations.                 Risk Factors:Hypertension. Covid -19 Positive.  Sonographer:    Tiffany Dance Referring Phys: Jeoffrey Massed, M IMPRESSIONS  1. Left ventricular ejection fraction, by estimation, is 55 to 60%. The left ventricle has normal function. The left ventricle has no regional wall motion abnormalities. There is mild concentric left ventricular hypertrophy. Left ventricular diastolic parameters were normal.  2. Right ventricular systolic function is normal. The right ventricular size is normal. There is normal pulmonary artery systolic pressure. The estimated right ventricular systolic pressure is 24.5 mmHg.  3. A small pericardial effusion is present. There is no evidence of cardiac tamponade.  4. The mitral valve is normal in structure. Trivial mitral valve regurgitation. No evidence of mitral stenosis.  5. The aortic valve is tricuspid. There is mild calcification of the aortic valve. Aortic valve regurgitation is not visualized. Mild aortic valve sclerosis is present, with no evidence of aortic valve stenosis.  6. The inferior vena cava is normal in size with greater than 50% respiratory variability, suggesting right atrial pressure of 3 mmHg. Comparison(s): No prior Echocardiogram.  Conclusion(s)/Recommendation(s): Otherwise normal echocardiogram, with minor abnormalities  described in the report. FINDINGS  Left Ventricle: Left ventricular ejection fraction, by estimation, is 55 to 60%. The left ventricle has normal function. The left ventricle has no regional wall motion abnormalities. The left ventricular internal cavity size was normal in size. There is  mild concentric left ventricular hypertrophy. Left ventricular diastolic parameters were normal. Right Ventricle: The right ventricular size is normal. No increase in right ventricular wall thickness. Right ventricular systolic function is normal. There is normal pulmonary artery systolic pressure. The tricuspid regurgitant velocity is 2.32 m/s, and  with an assumed right atrial pressure of 3 mmHg, the estimated right ventricular systolic pressure is 24.5 mmHg. Left Atrium: Left atrial size was normal in size. Right Atrium: Right atrial size was normal in size. Pericardium: A small pericardial effusion is present. There is no evidence of cardiac tamponade. Mitral Valve: The mitral valve is normal in structure. Trivial mitral valve regurgitation. No evidence of mitral valve stenosis. Tricuspid Valve: The tricuspid valve is normal in structure. Tricuspid valve regurgitation is trivial. No evidence of tricuspid stenosis. Aortic Valve: The aortic valve is tricuspid. There is mild calcification of the aortic valve. Aortic valve regurgitation is not visualized. Mild aortic valve sclerosis is present, with no evidence of aortic valve stenosis. Pulmonic Valve: The pulmonic valve was not well visualized. Pulmonic valve regurgitation is not visualized. No evidence of pulmonic stenosis. Aorta: The aortic root, ascending aorta and aortic arch are all structurally normal, with no evidence of dilitation or obstruction. Venous: The inferior vena cava is normal in size with greater than 50% respiratory variability, suggesting right atrial pressure of 3 mmHg.  IAS/Shunts: No atrial level shunt detected by color flow Doppler.  LEFT VENTRICLE PLAX 2D LVIDd:         4.00 cm LVIDs:         2.77 cm LV PW:         1.24 cm LV IVS:        1.16 cm LVOT diam:     2.00 cm LV SV:         51 LV SV Index:   30 LVOT Area:     3.14 cm  RIGHT VENTRICLE          IVC RV Basal diam:  2.58 cm  IVC diam: 1.42 cm TAPSE (M-mode): 2.4 cm LEFT ATRIUM             Index       RIGHT ATRIUM           Index LA diam:        3.30 cm 1.91 cm/m  RA Area:     12.00 cm LA Vol (A2C):   41.1 ml 23.82 ml/m RA Volume:   23.40 ml  13.56 ml/m LA Vol (A4C):   34.8 ml 20.17 ml/m LA Biplane Vol: 40.1 ml 23.24 ml/m  AORTIC VALVE LVOT Vmax:   92.15 cm/s LVOT Vmean:  59.400 cm/s LVOT VTI:    0.163 m  AORTA Ao Root diam: 3.50 cm Ao Asc diam:  3.60 cm MITRAL VALVE               TRICUSPID VALVE MV Area (PHT): 2.76 cm    TR Peak grad:   21.5 mmHg MV Decel Time: 275 msec    TR Vmax:        232.00 cm/s MV E velocity: 60.60 cm/s MV A velocity: 85.70 cm/s  SHUNTS MV E/A ratio:  0.71  Systemic VTI:  0.16 m                            Systemic Diam: 2.00 cm Jodelle Red MD Electronically signed by Jodelle Red MD Signature Date/Time: 09/14/2020/8:03:57 AM    Final    VAS [place] LOWER EXTREMITY VENOUS (DVT)  Result Date: 09/13/2020  Lower Venous DVTStudy Indications: Elevated Ddimer.  Risk Factors: COVID 19 positive. Comparison Study: No prior studies. Performing Technologist: Chanda Busing RVT  Examination Guidelines: A complete evaluation includes B-mode imaging, spectral Doppler, color Doppler, and power Doppler as needed of all accessible portions of each vessel. Bilateral testing is considered an integral part of a complete examination. Limited examinations for reoccurring indications may be performed as noted. The reflux portion of the exam is performed with the patient in reverse Trendelenburg.  +---------+---------------+---------+-----------+----------+--------------+ RIGHT     CompressibilityPhasicitySpontaneityPropertiesThrombus Aging +---------+---------------+---------+-----------+----------+--------------+ CFV      Full           Yes      Yes                                 +---------+---------------+---------+-----------+----------+--------------+ SFJ      Full                                                        +---------+---------------+---------+-----------+----------+--------------+ FV Prox  Full                                                        +---------+---------------+---------+-----------+----------+--------------+ FV Mid   Full                                                        +---------+---------------+---------+-----------+----------+--------------+ FV DistalFull                                                        +---------+---------------+---------+-----------+----------+--------------+ PFV      Full                                                        +---------+---------------+---------+-----------+----------+--------------+ POP      Full           Yes      Yes                                 +---------+---------------+---------+-----------+----------+--------------+ PTV      Full                                                        +---------+---------------+---------+-----------+----------+--------------+  PERO     Full                                                        +---------+---------------+---------+-----------+----------+--------------+   +---------+---------------+---------+-----------+----------+--------------+ LEFT     CompressibilityPhasicitySpontaneityPropertiesThrombus Aging +---------+---------------+---------+-----------+----------+--------------+ CFV      Full           Yes      Yes                                 +---------+---------------+---------+-----------+----------+--------------+ SFJ      Full                                                         +---------+---------------+---------+-----------+----------+--------------+ FV Prox  Full                                                        +---------+---------------+---------+-----------+----------+--------------+ FV Mid   Full                                                        +---------+---------------+---------+-----------+----------+--------------+ FV DistalFull                                                        +---------+---------------+---------+-----------+----------+--------------+ PFV      Full                                                        +---------+---------------+---------+-----------+----------+--------------+ POP      Full           Yes      Yes                                 +---------+---------------+---------+-----------+----------+--------------+ PTV      Full                                                        +---------+---------------+---------+-----------+----------+--------------+ PERO     Full                                                        +---------+---------------+---------+-----------+----------+--------------+  Summary: RIGHT: - There is no evidence of deep vein thrombosis in the lower extremity.  - No cystic structure found in the popliteal fossa.  LEFT: - There is no evidence of deep vein thrombosis in the lower extremity.  - No cystic structure found in the popliteal fossa.  *See table(s) above for measurements and observations. Electronically signed by Coral Else MD on 09/13/2020 at 8:53:51 PM.    Final

## 2020-09-16 NOTE — Plan of Care (Signed)
  Problem: Education: Goal: Knowledge of risk factors and measures for prevention of condition will improve Outcome: Progressing   Problem: Coping: Goal: Psychosocial and spiritual needs will be supported Outcome: Progressing   Problem: Respiratory: Goal: Will maintain a patent airway Outcome: Progressing Goal: Complications related to the disease process, condition or treatment will be avoided or minimized Outcome: Progressing   Problem: Clinical Measurements: Goal: Ability to maintain clinical measurements within normal limits will improve Outcome: Progressing Goal: Respiratory complications will improve Outcome: Progressing   Problem: Nutrition: Goal: Adequate nutrition will be maintained Outcome: Progressing   Problem: Elimination: Goal: Will not experience complications related to bowel motility Outcome: Progressing Goal: Will not experience complications related to urinary retention Outcome: Progressing   Problem: Safety: Goal: Ability to remain free from injury will improve Outcome: Progressing   Problem: Skin Integrity: Goal: Risk for impaired skin integrity will decrease Outcome: Progressing   Problem: Education: Goal: Knowledge of risk factors and measures for prevention of condition will improve Outcome: Progressing   Problem: Respiratory: Goal: Will maintain a patent airway Outcome: Progressing Goal: Complications related to the disease process, condition or treatment will be avoided or minimized Outcome: Progressing   Problem: Coping: Goal: Psychosocial and spiritual needs will be supported Outcome: Not Progressing

## 2020-09-16 NOTE — Progress Notes (Signed)
   09/16/20 0737  Assess: MEWS Score  Temp 97.9 F (36.6 C)  BP (!) 156/89  Pulse Rate (!) 102  ECG Heart Rate (!) 101  Resp (!) 40  SpO2 90 %  Assess: MEWS Score  MEWS Temp 0  MEWS Systolic 0  MEWS Pulse 1  MEWS RR 3  MEWS LOC 0  MEWS Score 4  MEWS Score Color Red  Assess: if the MEWS score is Yellow or Red  Were vital signs taken at a resting state? Yes  Focused Assessment No change from prior assessment  Early Detection of Sepsis Score *See Row Information* Medium  MEWS guidelines implemented *See Row Information* Yes  Treat  MEWS Interventions Administered scheduled meds/treatments;Escalated (See documentation below)  Pain Scale 0-10  Pain Score 0  Take Vital Signs  Increase Vital Sign Frequency  Red: Q 1hr X 4 then Q 4hr X 4, if remains red, continue Q 4hrs  Escalate  MEWS: Escalate Red: discuss with charge nurse/RN and provider, consider discussing with RRT  Notify: Charge Nurse/RN  Name of Charge Nurse/RN Notified Pricilla Larsson RN  Date Charge Nurse/RN Notified 09/16/20  Time Charge Nurse/RN Notified [id number]  Notify: Provider  Provider Name/Title Dr. Jerral Ralph  Date Provider Notified 09/16/20  Time Provider Notified [phone]  Notification Type Face-to-face  Notification Reason Other (Comment) (to make aware of red MEWS)  Response No new orders  Document  Progress note created (see row info) Yes

## 2020-09-17 DIAGNOSIS — U071 COVID-19: Secondary | ICD-10-CM | POA: Diagnosis not present

## 2020-09-17 DIAGNOSIS — N179 Acute kidney failure, unspecified: Secondary | ICD-10-CM | POA: Diagnosis not present

## 2020-09-17 DIAGNOSIS — J9601 Acute respiratory failure with hypoxia: Secondary | ICD-10-CM | POA: Diagnosis not present

## 2020-09-17 DIAGNOSIS — I248 Other forms of acute ischemic heart disease: Secondary | ICD-10-CM | POA: Diagnosis not present

## 2020-09-17 LAB — COMPREHENSIVE METABOLIC PANEL
ALT: 68 U/L — ABNORMAL HIGH (ref 0–44)
AST: 73 U/L — ABNORMAL HIGH (ref 15–41)
Albumin: 2.8 g/dL — ABNORMAL LOW (ref 3.5–5.0)
Alkaline Phosphatase: 107 U/L (ref 38–126)
Anion gap: 8 (ref 5–15)
BUN: 38 mg/dL — ABNORMAL HIGH (ref 8–23)
CO2: 25 mmol/L (ref 22–32)
Calcium: 9 mg/dL (ref 8.9–10.3)
Chloride: 99 mmol/L (ref 98–111)
Creatinine, Ser: 1.5 mg/dL — ABNORMAL HIGH (ref 0.61–1.24)
GFR, Estimated: 46 mL/min — ABNORMAL LOW (ref >60)
Glucose, Bld: 164 mg/dL — ABNORMAL HIGH (ref 70–99)
Potassium: 4.4 mmol/L (ref 3.5–5.1)
Sodium: 132 mmol/L — ABNORMAL LOW (ref 135–145)
Total Bilirubin: 1.1 mg/dL (ref 0.3–1.2)
Total Protein: 5.6 g/dL — ABNORMAL LOW (ref 6.5–8.1)

## 2020-09-17 LAB — CULTURE, BLOOD (ROUTINE X 2)
Culture: NO GROWTH
Culture: NO GROWTH
Special Requests: ADEQUATE

## 2020-09-17 LAB — CBC WITH DIFFERENTIAL/PLATELET
Abs Immature Granulocytes: 0.53 10*3/uL — ABNORMAL HIGH (ref 0.00–0.07)
Basophils Absolute: 0.1 10*3/uL (ref 0.0–0.1)
Basophils Relative: 0 %
Eosinophils Absolute: 0 10*3/uL (ref 0.0–0.5)
Eosinophils Relative: 0 %
HCT: 40.2 % (ref 39.0–52.0)
Hemoglobin: 14 g/dL (ref 13.0–17.0)
Immature Granulocytes: 2 %
Lymphocytes Relative: 2 %
Lymphs Abs: 0.5 10*3/uL — ABNORMAL LOW (ref 0.7–4.0)
MCH: 31.2 pg (ref 26.0–34.0)
MCHC: 34.8 g/dL (ref 30.0–36.0)
MCV: 89.5 fL (ref 80.0–100.0)
Monocytes Absolute: 0.8 10*3/uL (ref 0.1–1.0)
Monocytes Relative: 3 %
Neutro Abs: 27.6 10*3/uL — ABNORMAL HIGH (ref 1.7–7.7)
Neutrophils Relative %: 93 %
Platelets: 356 10*3/uL (ref 150–400)
RBC: 4.49 MIL/uL (ref 4.22–5.81)
RDW: 12.9 % (ref 11.5–15.5)
WBC: 30 10*3/uL — ABNORMAL HIGH (ref 4.0–10.5)
nRBC: 0 % (ref 0.0–0.2)

## 2020-09-17 LAB — C-REACTIVE PROTEIN: CRP: 1.6 mg/dL — ABNORMAL HIGH (ref <1.0)

## 2020-09-17 LAB — D-DIMER, QUANTITATIVE: D-Dimer, Quant: 1.69 ug/mL-FEU — ABNORMAL HIGH (ref 0.00–0.50)

## 2020-09-17 LAB — FERRITIN: Ferritin: 1281 ng/mL — ABNORMAL HIGH (ref 24–336)

## 2020-09-17 NOTE — Progress Notes (Signed)
PROGRESS NOTE                                                                                                                                                                                                             Patient Demographics:    Larry Olsen, is a 79 y.o. male, DOB - [birth year], POL:[id number]  Outpatient Primary MD for the patient is Patient, No Pcp Per   Admit date - 09/12/2020   LOS - 5  Chief Complaint  Patient presents with  . Shortness of Breath  . Covid Positive       Brief Narrative: Patient is a 79 y.o. male with PMHx of HTN, HLD-known Covid 19+ approximately 2 weeks prior to this hospital stay-presenting with severe shortness of breath-found to have severe acute hypoxemic respiratory failure requiring 15 L of HFNC in the setting of COVID-19 pneumonia.  See below for further details  COVID-19 vaccinated status: Unvaccinated  Significant Events: 10/26>> Admit to [facility] for severe hypoxia due to COVID-19 pneumonia, AKI and hyponatremia.  Significant studies: 10/26>>Chest x-ray: Diffuse interstitial airspace opacities 10/26>> CTA chest: No PE, multifocal pneumonia 10/27>> bilateral lower extremity Doppler: No DVT 10/27>> Echo: EF 55-60%, no wall motion abnormalities, small pericardial effusion  COVID-19 medications: Steroids: 10/26>> Remdesivir: 10/26>> 10/30 Actemra: 10/26 x 1  Antibiotics: None  Microbiology data: 10/26 >>blood culture: No growth  Procedures: None  Consults: None  DVT prophylaxis: Prophylactic Lovenox at intermediate dosing   Subjective:   Seen earlier this morning-see my prior note from this morning. He wanted to be transition to comfort care-subsequently his daughter was able to come and visit-he has subsequently decided to reassess on 11/1. He remains unchanged-continues to have dyspnea with minimal exertion and with minimal activity.   Assessment  & Plan :     Acute Hypoxic Resp Failure due to Covid 19 Viral pneumonia: No improvement over the past few days-continues to have severe hypoxemia-remains very tenuous-remains on heated high flow. Completed Remdesivir on 10/30-remains on steroids. See below regarding palliative care discussion.  Fever: afebrile O2 requirements:  SpO2: 93 % O2 Flow Rate (L/min): 20 L/min FiO2 (%): 40 %   COVID-19 Labs: Recent Labs    09/15/20 0119 09/16/20 0202 09/17/20 0347  DDIMER 2.73* 1.86* 1.69*  FERRITIN 1,351* 1,283* 1,281*  CRP 6.3* 2.9* 1.6*  Component Value Date/Time   BNP 240.6 (H) 09/12/2020 1342    Recent Labs  Lab 09/12/20 1341  PROCALCITON 0.72    Lab Results  Component Value Date   SARSCOV2NAA POSITIVE (A) 09/12/2020    Prone/Incentive Spirometry: encouraged patient to lie prone for 3-4 hours at a time for a total of 16 hours a day, and to encourage incentive spirometry use 3-4/hour.  Leukocytosis: Secondary to steroids-no indication for bacterial infection. Follow.  Elevated D-dimer: Secondary to COVID-19-CTA chest negative for PE/lower extremity Dopplers negative for DVT-continue intermediate dosing of Lovenox.  Follow.    AKI: Likely hemodynamically mediated-had resolved on 10/27-but then again worsened-no slowly downtrending with supportive care.  Avoid nephrotoxic agents.  Hyponatremia: Secondary to hypovolemia/dehydration-improved after gentle hydration.  Volume status remains stable.  Transaminitis: Secondary to COVID-19-appears mild-stable for close monitoring/follow-up.  Troponin elevation: Trend is flat-not consistent with ACS-likely demand ischemia due to severe hypoxemia/AKI-echo with preserved EF-no wall motion abnormality.  Doubt further work-up required while inpatient.  HTN: BP stable-continue amlodipine. Continue to hold losartan due to AKI.   HLD: Cautiously continue with statin-watch LFTs.  Palliative care discussion: Have had multiple discussions with  the patient over the past few days-see prior notes-DNR remains in place. Patient is very clear that he does not want aggressive care including intubation. He remains very tenuous-and has dyspnea with minimal exertion-this morning-he conveyed to me that he did not want to continue on like this-as he feels he is suffering-and wanted to transition to comfort measures. I subsequently spoke with the daughter-who came to see patient-after extensive family discussion-patient has decided to hold off on transition to comfort measures for now-he/family wishes that we will reassess tomorrow. Both patient and family aware that if he deteriorates-we really have no other choice apart to transition to comfort measures. We will continue to engage with patient and family-reassess on 11/1.  GI prophylaxis: PPI  ABG: No results found for: PHART, PCO2ART, PO2ART, HCO3, TCO2, ACIDBASEDEF, O2SAT  Vent Settings: N/A FiO2 (%):  [40 %-80 %] 40 %  Condition - Extremely Guarded  Family Communication  : Daughter Adonis Huguenin [phone]) updated over the phone initially-then met with her in the hallway when she came to see patient.  Code Status:DNR  Diet :  Diet Order            Diet Heart Room service appropriate? Yes; Fluid consistency: Thin  Diet effective now                  Disposition Plan  :   Status is: Inpatient  Remains inpatient appropriate because:Inpatient level of care appropriate due to severity of illness   Dispo: The patient is from: Home              Anticipated d/c is to: TBD              Anticipated d/c date is: > 3 days              Patient currently is not medically stable to d/c.   Barriers to discharge: Hypoxia requiring O2 supplementation/complete 5 days of IV Remdesivir  Antimicorbials  :    Anti-infectives (From admission, onward)   Start     Dose/Rate Route Frequency Ordered Stop   09/13/20 1000  remdesivir 100 mg in sodium chloride 0.9 % 100 mL IVPB       "Followed by"  Linked Group Details   100 mg 200 mL/hr over 30 Minutes Intravenous Daily 09/12/20 1509 09/16/20  5009   09/12/20 1630  remdesivir 200 mg in sodium chloride 0.9% 250 mL IVPB       "Followed by" Linked Group Details   200 mg 580 mL/hr over 30 Minutes Intravenous Once 09/12/20 1509 09/13/20 0915      Inpatient Medications  Scheduled Meds: . amLODipine  2.5 mg Oral Daily  . atorvastatin  10 mg Oral Daily  . enoxaparin (LOVENOX) injection  40 mg Subcutaneous Q12H  . methylPREDNISolone (SOLU-MEDROL) injection  60 mg Intravenous Q12H  . pantoprazole  40 mg Oral Q1200  . sodium chloride flush  3 mL Intravenous Q12H   Continuous Infusions:  PRN Meds:.acetaminophen, albuterol, chlorpheniramine-HYDROcodone, guaiFENesin-dextromethorphan, ondansetron (ZOFRAN) IV, oxymetazoline, polyethylene glycol, sodium chloride   Time Spent in minutes  35   See all Orders from today for further details   Oren Binet M.D on 09/17/2020 at 1:20 PM  To page go to [email or web address] - use universal password  [facility] -  Office  [phone]    Objective:   Vitals:   09/17/20 0739 09/17/20 0925 09/17/20 1019 09/17/20 1129  BP: (!) 146/73   119/83  Pulse: (!) 102 (!) 104 (!) 109 (!) 111  Resp: (!) 26 (!) 27 (!) 28 (!) 29  Temp: 97.7 F (36.5 C)   (!) 97.5 F (36.4 C)  TempSrc: Axillary   Axillary  SpO2: 92% 96% 92% 93%  Weight:      Height:        Wt Readings from Last 3 Encounters:  09/12/20 65.8 kg     Intake/Output Summary (Last 24 hours) at 09/17/2020 1320 Last data filed at 09/17/2020 0900 Gross per 24 hour  Intake 860 ml  Output 1600 ml  Net -740 ml     Physical Exam Gen Exam:Alert awake-not in any distress HEENT:atraumatic, normocephalic Chest: B/L clear to auscultation anteriorly CVS:S1S2 regular Abdomen:soft non tender, non distended Extremities:no edema Neurology: Non focal Skin: no rash   Data Review:    CBC Recent Labs  Lab 09/13/20 0121  09/14/20 0106 09/15/20 0119 09/16/20 0202 09/17/20 0347  WBC 14.0* 25.8* 28.4* 27.2* 30.0*  HGB 12.7* 13.6 14.3 13.7 14.0  HCT 35.6* 38.1* 40.2 39.7 40.2  PLT 211 316 400 352 356  MCV 86.8 87.2 88.0 89.0 89.5  MCH 31.0 31.1 31.3 30.7 31.2  MCHC 35.7 35.7 35.6 34.5 34.8  RDW 12.6 12.8 13.0 12.9 12.9  LYMPHSABS 0.2* 0.3* 0.0* 0.5* 0.5*  MONOABS 0.5 0.9 0.3 0.8 0.8  EOSABS 0.0 0.0 0.0 0.0 0.0  BASOSABS 0.0 0.1 0.0 0.0 0.1    Chemistries  Recent Labs  Lab 09/13/20 0121 09/13/20 0745 09/14/20 0106 09/14/20 1635 09/15/20 0119 09/16/20 0202 09/17/20 0347  NA 124*   < > 126* 128* 129* 131* 132*  K 4.3   < > 4.4 3.9 4.0 4.2 4.4  CL 94*   < > 95* 95* 95* 97* 99  CO2 20*   < > 21* 18* 19* 22 25  GLUCOSE 174*   < > 184* 228* 177* 198* 164*  BUN 13   < > 32* 41* 41* 38* 38*  CREATININE 0.96   < > 1.58* 1.67* 1.66* 1.54* 1.50*  CALCIUM 8.3*   < > 8.8* 9.1 9.0 9.0 9.0  MG 1.9  --   --   --   --   --   --   AST 80*  --  84*  --  87* 80* 73*  ALT 48*  --  56*  --  59* 63* 68*  ALKPHOS 88  --  102  --  113 111 107  BILITOT 1.4*  --  0.9  --  1.0 1.1 1.1   < > = values in this interval not displayed.   ------------------------------------------------------------------------------------------------------------------ No results for input(s): CHOL, HDL, LDLCALC, TRIG, CHOLHDL, LDLDIRECT in the last 72 hours.  No results found for: HGBA1C ------------------------------------------------------------------------------------------------------------------ No results for input(s): TSH, T4TOTAL, T3FREE, THYROIDAB in the last 72 hours.  Invalid input(s): FREET3 ------------------------------------------------------------------------------------------------------------------ Recent Labs    09/16/20 0202 09/17/20 0347  FERRITIN 1,283* 1,281*    Coagulation profile No results for input(s): INR, PROTIME in the last 168 hours.  Recent Labs    09/16/20 0202 09/17/20 0347  DDIMER 1.86*  1.69*    Cardiac Enzymes No results for input(s): CKMB, TROPONINI, MYOGLOBIN in the last 168 hours.  Invalid input(s): CK ------------------------------------------------------------------------------------------------------------------    Component Value Date/Time   BNP 240.6 (H) 09/12/2020 1342    Micro Results Recent Results (from the past 240 hour(s))  Blood Culture (routine x 2)     Status: None   Collection Time: 09/12/20  2:44 PM   Specimen: BLOOD LEFT ARM  Result Value Ref Range Status   Specimen Description BLOOD LEFT ARM  Final   Special Requests   Final    BOTTLES DRAWN AEROBIC AND ANAEROBIC Blood Culture results may not be optimal due to an inadequate volume of blood received in culture bottles   Culture   Final    NO GROWTH 5 DAYS Performed at [facility], [place] [address], [place], [place] [address]    Report Status 09/17/2020 FINAL  Final  Blood Culture (routine x 2)     Status: None   Collection Time: 09/12/20  3:00 PM   Specimen: BLOOD RIGHT ARM  Result Value Ref Range Status   Specimen Description BLOOD RIGHT ARM  Final   Special Requests   Final    BOTTLES DRAWN AEROBIC AND ANAEROBIC Blood Culture adequate volume   Culture   Final    NO GROWTH 5 DAYS Performed at [facility], [place] [address], [place], [place] [address]    Report Status 09/17/2020 FINAL  Final  Respiratory Panel by RT PCR (Flu A&B, Covid) - Nasopharyngeal Swab     Status: Abnormal   Collection Time: 09/12/20  3:08 PM   Specimen: Nasopharyngeal Swab  Result Value Ref Range Status   SARS Coronavirus 2 by RT PCR POSITIVE (A) NEGATIVE Final    Comment: emailed L. Berdik RN 17:00 09/12/20 ([email or web address]) (NOTE) SARS-CoV-2 target nucleic acids are DETECTED.  SARS-CoV-2 RNA is generally detectable in upper respiratory specimens  during the acute phase of infection. Positive results are indicative of the presence of the identified virus, but do not rule out bacterial infection or  co-infection with other pathogens not detected by the test. Clinical correlation with patient history and other diagnostic information is necessary to determine patient infection status. The expected result is Negative.  Fact Sheet for Patients:  PinkCheek.be  Fact Sheet for Healthcare Providers: GravelBags.it  This test is not yet approved or cleared by the [place] FDA and  has been authorized for detection and/or diagnosis of SARS-CoV-2 by FDA under an Emergency Use Authorization (EUA).  This EUA will remain in effect (meaning this test can be used) for the duration of  the COVID-19  declaration under Section 564(b)(1) of the Act, 21 U.S.C. section 360bbb-3(b)(1), unless the authorization is terminated or revoked sooner.  Influenza A by PCR NEGATIVE NEGATIVE Final   Influenza B by PCR NEGATIVE NEGATIVE Final    Comment: (NOTE) The Xpert Xpress SARS-CoV-2/FLU/RSV assay is intended as an aid in  the diagnosis of influenza from Nasopharyngeal swab specimens and  should not be used as a sole basis for treatment. Nasal washings and  aspirates are unacceptable for Xpert Xpress SARS-CoV-2/FLU/RSV  testing.  Fact Sheet for Patients: PinkCheek.be  Fact Sheet for Healthcare Providers: GravelBags.it  This test is not yet approved or cleared by the [place] FDA and  has been authorized for detection and/or diagnosis of SARS-CoV-2 by  FDA under an Emergency Use Authorization (EUA). This EUA will remain  in effect (meaning this test can be used) for the duration of the  Covid-19 declaration under Section 564(b)(1) of the Act, 21  U.S.C. section 360bbb-3(b)(1), unless the authorization is  terminated or revoked. Performed at [facility], [place] [address], [place], [place] [address]     Radiology Reports CT Angio Chest PE W and/or Wo  Contrast  Result Date: 09/12/2020 CLINICAL DATA:  79 year old male with elevated D-dimer. Concern for pulmonary embolism. EXAM: CT ANGIOGRAPHY CHEST WITH CONTRAST TECHNIQUE: Multidetector CT imaging of the chest was performed using the standard protocol during bolus administration of intravenous contrast. Multiplanar CT image reconstructions and MIPs were obtained to evaluate the vascular anatomy. CONTRAST:  108m OMNIPAQUE IOHEXOL 350 MG/ML SOLN COMPARISON:  Chest radiograph dated 09/12/2020. FINDINGS: Evaluation of this exam is limited due to respiratory motion artifact. Cardiovascular: There is no cardiomegaly or pericardial effusion. There is 3 vessel coronary vascular calcification. Moderate atherosclerotic calcification of the thoracic aorta. No aneurysmal dilatation or dissection. The origins of the great vessels of the aortic arch appear patent as visualized. Evaluation of the pulmonary arteries is limited due to respiratory motion artifact as well as streak artifact caused by patient's arms. No large or central pulmonary artery embolus identified. Mediastinum/Nodes: No hilar or mediastinal adenopathy. The esophagus and the thyroid gland are grossly unremarkable. No mediastinal fluid collection. Lungs/Pleura: Extensive patchy bilateral pulmonary opacities with predominant involvement of the lower lobes most consistent with multifocal pneumonia, likely viral or atypical in etiology including COVID-19. Clinical correlation is recommended. There are small bilateral pleural effusions. No pneumothorax. The central airways are patent. Upper Abdomen: No acute abnormality. Musculoskeletal: Degenerative changes of the spine. No acute osseous pathology. Review of the MIP images confirms the above findings. IMPRESSION: 1. No CT evidence of central pulmonary artery embolus. 2. Multifocal pneumonia, likely viral or atypical in etiology. Clinical correlation and follow-up to resolution recommended. 3. Small bilateral  pleural effusions. 4. Aortic Atherosclerosis (ICD10-I70.0). Electronically Signed   By: AAnner CreteM.D.   On: 09/12/2020 19:05   DG Chest Port 1 View  Result Date: 09/12/2020 CLINICAL DATA:  Shortness of breath, COVID-19 positive EXAM: PORTABLE CHEST 1 VIEW COMPARISON:  10/07/2008 FINDINGS: Heart size within normal limits. Aortic atherosclerosis. There are diffuse interstitial opacities throughout the left lung, most confluent within the left lung base. Mild interstitial prominence within the right lung. No large pleural fluid collection. No pneumothorax. IMPRESSION: Diffuse interstitial opacities throughout the left lung, most confluent within the left lung base. Findings concerning for multifocal atypical/viral infection versus asymmetric edema. Electronically Signed   By: NDavina PokeD.O.   On: 09/12/2020 14:59   DG Chest Port 1V same Day  Result Date: 09/15/2020 CLINICAL DATA:  Shortness of breath.  COVID-19 positive EXAM: PORTABLE CHEST 1 VIEW COMPARISON:  Chest radiograph  and chest CT September 12, 2020 FINDINGS: There is patchy airspace opacity throughout the lungs bilaterally, similar to recent prior studies. No consolidation. Heart size and pulmonary vascularity within normal limits. No adenopathy. There is aortic atherosclerosis. No bone lesions IMPRESSION: Persistent multifocal airspace opacity consistent with atypical organism pneumonia. No appreciable new opacity compared to recent studies. Stable cardiac silhouette. Aortic Atherosclerosis (ICD10-I70.0). Electronically Signed   By: Lowella Grip III M.D.   On: 09/15/2020 07:43   ECHOCARDIOGRAM COMPLETE  Result Date: 09/14/2020    ECHOCARDIOGRAM REPORT   Patient Name:   ADRIK KHIM Date of Exam: 09/13/2020 Medical Rec #:  [id number]       Height:       65.0 in Accession #:    [id number]      Weight:       145.0 lb Date of Birth:  [birth year]       BSA:          1.725 m Patient Age:    13 years        BP:           141/76  mmHg Patient Gender: M               HR:           101 bpm. Exam Location:  Inpatient Procedure: 2D Echo, Cardiac Doppler and Color Doppler Indications:    Acute Respiratory Failure 518.82 / R06.89  History:        Patient has no prior history of Echocardiogram examinations.                 Risk Factors:Hypertension. Covid -19 Positive.  Sonographer:    Tiffany Dance Referring Phys: Oren Binet, M IMPRESSIONS  1. Left ventricular ejection fraction, by estimation, is 55 to 60%. The left ventricle has normal function. The left ventricle has no regional wall motion abnormalities. There is mild concentric left ventricular hypertrophy. Left ventricular diastolic parameters were normal.  2. Right ventricular systolic function is normal. The right ventricular size is normal. There is normal pulmonary artery systolic pressure. The estimated right ventricular systolic pressure is 66.2 mmHg.  3. A small pericardial effusion is present. There is no evidence of cardiac tamponade.  4. The mitral valve is normal in structure. Trivial mitral valve regurgitation. No evidence of mitral stenosis.  5. The aortic valve is tricuspid. There is mild calcification of the aortic valve. Aortic valve regurgitation is not visualized. Mild aortic valve sclerosis is present, with no evidence of aortic valve stenosis.  6. The inferior vena cava is normal in size with greater than 50% respiratory variability, suggesting right atrial pressure of 3 mmHg. Comparison(s): No prior Echocardiogram. Conclusion(s)/Recommendation(s): Otherwise normal echocardiogram, with minor abnormalities described in the report. FINDINGS  Left Ventricle: Left ventricular ejection fraction, by estimation, is 55 to 60%. The left ventricle has normal function. The left ventricle has no regional wall motion abnormalities. The left ventricular internal cavity size was normal in size. There is  mild concentric left ventricular hypertrophy. Left ventricular diastolic  parameters were normal. Right Ventricle: The right ventricular size is normal. No increase in right ventricular wall thickness. Right ventricular systolic function is normal. There is normal pulmonary artery systolic pressure. The tricuspid regurgitant velocity is 2.32 m/s, and  with an assumed right atrial pressure of 3 mmHg, the estimated right ventricular systolic pressure is 94.7 mmHg. Left Atrium: Left atrial size was normal in size. Right Atrium: Right atrial size was normal in  size. Pericardium: A small pericardial effusion is present. There is no evidence of cardiac tamponade. Mitral Valve: The mitral valve is normal in structure. Trivial mitral valve regurgitation. No evidence of mitral valve stenosis. Tricuspid Valve: The tricuspid valve is normal in structure. Tricuspid valve regurgitation is trivial. No evidence of tricuspid stenosis. Aortic Valve: The aortic valve is tricuspid. There is mild calcification of the aortic valve. Aortic valve regurgitation is not visualized. Mild aortic valve sclerosis is present, with no evidence of aortic valve stenosis. Pulmonic Valve: The pulmonic valve was not well visualized. Pulmonic valve regurgitation is not visualized. No evidence of pulmonic stenosis. Aorta: The aortic root, ascending aorta and aortic arch are all structurally normal, with no evidence of dilitation or obstruction. Venous: The inferior vena cava is normal in size with greater than 50% respiratory variability, suggesting right atrial pressure of 3 mmHg. IAS/Shunts: No atrial level shunt detected by color flow Doppler.  LEFT VENTRICLE PLAX 2D LVIDd:         4.00 cm LVIDs:         2.77 cm LV PW:         1.24 cm LV IVS:        1.16 cm LVOT diam:     2.00 cm LV SV:         51 LV SV Index:   30 LVOT Area:     3.14 cm  RIGHT VENTRICLE          IVC RV Basal diam:  2.58 cm  IVC diam: 1.42 cm TAPSE (M-mode): 2.4 cm LEFT ATRIUM             Index       RIGHT ATRIUM           Index LA diam:        3.30 cm  1.91 cm/m  RA Area:     12.00 cm LA Vol (A2C):   41.1 ml 23.82 ml/m RA Volume:   23.40 ml  13.56 ml/m LA Vol (A4C):   34.8 ml 20.17 ml/m LA Biplane Vol: 40.1 ml 23.24 ml/m  AORTIC VALVE LVOT Vmax:   92.15 cm/s LVOT Vmean:  59.400 cm/s LVOT VTI:    0.163 m  AORTA Ao Root diam: 3.50 cm Ao Asc diam:  3.60 cm MITRAL VALVE               TRICUSPID VALVE MV Area (PHT): 2.76 cm    TR Peak grad:   21.5 mmHg MV Decel Time: 275 msec    TR Vmax:        232.00 cm/s MV E velocity: 60.60 cm/s MV A velocity: 85.70 cm/s  SHUNTS MV E/A ratio:  0.71        Systemic VTI:  0.16 m                            Systemic Diam: 2.00 cm Buford Dresser MD Electronically signed by Buford Dresser MD Signature Date/Time: 09/14/2020/8:03:57 AM    Final    VAS [place] LOWER EXTREMITY VENOUS (DVT)  Result Date: 09/13/2020  Lower Venous DVTStudy Indications: Elevated Ddimer.  Risk Factors: COVID 19 positive. Comparison Study: No prior studies. Performing Technologist: Oliver Hum RVT  Examination Guidelines: A complete evaluation includes B-mode imaging, spectral Doppler, color Doppler, and power Doppler as needed of all accessible portions of each vessel. Bilateral testing is considered an integral part of a complete examination. Limited examinations for reoccurring  indications may be performed as noted. The reflux portion of the exam is performed with the patient in reverse Trendelenburg.  +---------+---------------+---------+-----------+----------+--------------+ RIGHT    CompressibilityPhasicitySpontaneityPropertiesThrombus Aging +---------+---------------+---------+-----------+----------+--------------+ CFV      Full           Yes      Yes                                 +---------+---------------+---------+-----------+----------+--------------+ SFJ      Full                                                        +---------+---------------+---------+-----------+----------+--------------+ FV Prox   Full                                                        +---------+---------------+---------+-----------+----------+--------------+ FV Mid   Full                                                        +---------+---------------+---------+-----------+----------+--------------+ FV DistalFull                                                        +---------+---------------+---------+-----------+----------+--------------+ PFV      Full                                                        +---------+---------------+---------+-----------+----------+--------------+ POP      Full           Yes      Yes                                 +---------+---------------+---------+-----------+----------+--------------+ PTV      Full                                                        +---------+---------------+---------+-----------+----------+--------------+ PERO     Full                                                        +---------+---------------+---------+-----------+----------+--------------+   +---------+---------------+---------+-----------+----------+--------------+ LEFT     CompressibilityPhasicitySpontaneityPropertiesThrombus Aging +---------+---------------+---------+-----------+----------+--------------+ CFV      Full  Yes      Yes                                 +---------+---------------+---------+-----------+----------+--------------+ SFJ      Full                                                        +---------+---------------+---------+-----------+----------+--------------+ FV Prox  Full                                                        +---------+---------------+---------+-----------+----------+--------------+ FV Mid   Full                                                        +---------+---------------+---------+-----------+----------+--------------+ FV DistalFull                                                         +---------+---------------+---------+-----------+----------+--------------+ PFV      Full                                                        +---------+---------------+---------+-----------+----------+--------------+ POP      Full           Yes      Yes                                 +---------+---------------+---------+-----------+----------+--------------+ PTV      Full                                                        +---------+---------------+---------+-----------+----------+--------------+ PERO     Full                                                        +---------+---------------+---------+-----------+----------+--------------+     Summary: RIGHT: - There is no evidence of deep vein thrombosis in the lower extremity.  - No cystic structure found in the popliteal fossa.  LEFT: - There is no evidence of deep vein thrombosis in the lower extremity.  - No cystic structure found in the popliteal fossa.  *See table(s) above for measurements and observations. Electronically signed by Harold Barban MD on 09/13/2020 at 8:53:51  PM.    Final

## 2020-09-17 NOTE — Progress Notes (Signed)
Brief note:   Patient appears unchanged-he is still on heated high flow-gets very short of breath with minimal activity-claims that even talking makes him very short of breath.  He really does not want to continue with active treatment from what he tells me this morning-is inquiring about whether we can transition him to comfort measures.  I have talked with this patient almost on a daily basis regarding this-yesterday we had thought that we would give another few more days-and touch base either Tuesday/Wednesday to see if he had improved before making such a decision-this morning he has already indicated that he prefers that he be transitioned to comfort measures-asking me how much time he would have left when we "pull the plug".  After extensive discussion-he has agreed to wait-I have asked his daughter Larry Olsen to come and talk to him-and have a family discussion before we decide on anything.  I will touch base with him and his daughter later.

## 2020-09-18 DIAGNOSIS — R531 Weakness: Secondary | ICD-10-CM | POA: Diagnosis not present

## 2020-09-18 DIAGNOSIS — J9601 Acute respiratory failure with hypoxia: Secondary | ICD-10-CM | POA: Diagnosis not present

## 2020-09-18 DIAGNOSIS — Z515 Encounter for palliative care: Secondary | ICD-10-CM | POA: Diagnosis not present

## 2020-09-18 DIAGNOSIS — R0602 Shortness of breath: Secondary | ICD-10-CM

## 2020-09-18 DIAGNOSIS — Z7189 Other specified counseling: Secondary | ICD-10-CM | POA: Diagnosis not present

## 2020-09-18 DIAGNOSIS — U071 COVID-19: Secondary | ICD-10-CM | POA: Diagnosis not present

## 2020-09-18 DIAGNOSIS — I248 Other forms of acute ischemic heart disease: Secondary | ICD-10-CM | POA: Diagnosis not present

## 2020-09-18 DIAGNOSIS — N179 Acute kidney failure, unspecified: Secondary | ICD-10-CM | POA: Diagnosis not present

## 2020-09-18 LAB — COMPREHENSIVE METABOLIC PANEL
ALT: 71 U/L — ABNORMAL HIGH (ref 0–44)
AST: 68 U/L — ABNORMAL HIGH (ref 15–41)
Albumin: 2.8 g/dL — ABNORMAL LOW (ref 3.5–5.0)
Alkaline Phosphatase: 121 U/L (ref 38–126)
Anion gap: 8 (ref 5–15)
BUN: 38 mg/dL — ABNORMAL HIGH (ref 8–23)
CO2: 25 mmol/L (ref 22–32)
Calcium: 8.7 mg/dL — ABNORMAL LOW (ref 8.9–10.3)
Chloride: 98 mmol/L (ref 98–111)
Creatinine, Ser: 1.52 mg/dL — ABNORMAL HIGH (ref 0.61–1.24)
GFR, Estimated: 46 mL/min — ABNORMAL LOW (ref >60)
Glucose, Bld: 171 mg/dL — ABNORMAL HIGH (ref 70–99)
Potassium: 4.8 mmol/L (ref 3.5–5.1)
Sodium: 131 mmol/L — ABNORMAL LOW (ref 135–145)
Total Bilirubin: 1.1 mg/dL (ref 0.3–1.2)
Total Protein: 5.6 g/dL — ABNORMAL LOW (ref 6.5–8.1)

## 2020-09-18 LAB — GLUCOSE, CAPILLARY: Glucose-Capillary: 188 mg/dL — ABNORMAL HIGH (ref 70–99)

## 2020-09-18 LAB — D-DIMER, QUANTITATIVE: D-Dimer, Quant: 1.53 ug/mL-FEU — ABNORMAL HIGH (ref 0.00–0.50)

## 2020-09-18 LAB — CBC
HCT: 39.1 % (ref 39.0–52.0)
Hemoglobin: 13.8 g/dL (ref 13.0–17.0)
MCH: 32.1 pg (ref 26.0–34.0)
MCHC: 35.3 g/dL (ref 30.0–36.0)
MCV: 90.9 fL (ref 80.0–100.0)
Platelets: 333 10*3/uL (ref 150–400)
RBC: 4.3 MIL/uL (ref 4.22–5.81)
RDW: 13.2 % (ref 11.5–15.5)
WBC: 46.9 10*3/uL — ABNORMAL HIGH (ref 4.0–10.5)
nRBC: 0 % (ref 0.0–0.2)

## 2020-09-18 LAB — C-REACTIVE PROTEIN: CRP: 1 mg/dL — ABNORMAL HIGH (ref <1.0)

## 2020-09-18 NOTE — Consult Note (Signed)
Consultation Note Date: 09/18/2020   Patient Name: Larry Olsen  DOB: 1940/12/31  MRN: [id number]  Age / Sex: 79 y.o., male  PCP: Patient, No Pcp Per Referring Physician: Maretta Bees, MD  Reason for Consultation: Establishing goals of care  HPI/Patient Profile: 79 y.o. male    admitted on 09/12/2020    Clinical Assessment and Goals of Care: 79 year old gentleman with a past medical history of hypertension and dyslipidemia.  Patient's baseline up until 3 weeks ago is was such that he was living independently, was active and in charge of all of his affairs and activities, lived with his dog at home.  He has a daughter.  Patient worked in [place], he is now retired.  Patient's wife and 2 adult children are deceased.  Patient provided most of his history, patient's daughter also provided with supplemental information.  Patient has been admitted to hospital medicine service with severe shortness of breath, found to have severe acute hypoxic respiratory failure in the setting of COVID-19 pneumonia.  Hospital course also complicated by acute kidney injury hyponatremia and high oxygen requirements, chest imaging showing multifocal pneumonia and diffuse interstitial airspace opacities.  Patient has received all appropriate COVID-19 management medications steroids, Actemra, remdesivir.  Overall, patient remains with high O2 requirements.  Over the course of the past 24 hours, he has required less supplemental oxygen.  Ongoing goals of care discussions have been undertaken between the patient, his daughter and patient's primary service which is hospital medicine.  Patient reportedly has stated that he would want to elect for discontinuing current therapies and did not want to go to a nursing home.  A palliative medicine consultation has been requested for ongoing goals of care discussions.  Mr. Loudermilk is awake  alert resting in bed.  He has finished some of his breakfast.  He asked for more water.  I introduced myself and palliative care as follows:  Palliative medicine is specialized medical care for people living with serious illness. It focuses on providing relief from the symptoms and stress of a serious illness. The goal is to improve quality of life for both the patient and the family.  Goals of care: Broad aims of medical therapy in relation to the patient's values and preferences. Our aim is to provide medical care aimed at enabling patients to achieve the goals that matter most to them, given the circumstances of their particular medical situation and their constraints.   Brief life review performed.  Goals wishes and values important to the patient attempted to be explored.  Discussed about scope of current hospitalization.  I discussed with the patient about 2 different pathways of care.  The first pathway would be to continue current mode of care, to consider skilled nursing facility, efforts towards rehabilitation, efforts towards maintenance stabilization and recovery.  Alternatively, also gave him information about second mode of care, mode of care which focuses primarily on comfort measures, and that in this mode of care medications such as opioids and benzodiazepines are judiciously used to  control symptoms such as shortness of breath, anxiety, air hunger and pain.  In the comfort care pathway, focus is not on high doses of supplemental oxygen, in fact oxygen is weaned off and opioids are used for comfort.  Such type of care can be started in the hospital and is then typically transitioned over to residential hospice facility.  Patient states," there is a beginning and there is an and.  I believe this is the end for me."  He states that he does not want to be a burden on anybody, he does not want to be a burden on his daughter.  He states that he will miss his dog terribly but that he is ready to  go.  He states he is not depressed, he states that he has lived a long and satisfactory life.  He states that he is not electing for comfort measures because he has unmanaged symptoms.  He simply states that he does not wish to continue current mode of care and wishes to establish comfort measures and is ready for hospice.  Call placed and discussed with his daughter Larry Olsen at [phone].  I introduced my role and scope of hospice/palliative services.  Brief life review performed.  I discussed with her extensively on the phone.  She states that her father is " ready to go."  She states that being dependent on high-dose oxygen or being dependent on staff inside nursing homes is not quality of life for the patient.  She does not believe that he is depressed or has uncontrolled symptoms that are causing him to elect comfort measures.  Daughter states that the patient has been very careful would always wear a mask and would always socially distance.  She states that he had wanted to get his vaccine at his primary care physician's office.  She states that he was almost in the process of going to [facility] to get the first dose of his vaccine when he got sick.  She describes him as a respectful independent person who does not want to be a burden.  She asked for 24 more hours of current treatments, she will notify the patient's brother and sister about his decision and she will discuss with him further about getting his affairs in order.   HCPOA Daughter  SUMMARY OF RECOMMENDATIONS   DNR Continue current mode of care for the next 24 hours Full scope of comfort measures, wean oxygen, consider transfer to residential hospice, start end-of-life care order set on 09-19-2020 based on patient and daughters preferences.  Daughter is aware that to hospices hospice in [facility] and [place] are [place] for comfort measures. Thank you for the consult.  Palliative medicine team  will continue to follow.` Code Status/Advance Care Planning:  DNR    Symptom Management:    as above.   Palliative Prophylaxis:   Delirium Protocol  Psycho-social/Spiritual:   Desire for further Chaplaincy support:yes  Additional Recommendations: Education on Hospice  Prognosis:   Guarded, could be less than 2 weeks.  Discharge Planning: Hospice facility, most likely.      Primary Diagnoses: Present on Admission: . Pneumonia due to COVID-19 virus   I have reviewed the medical record, interviewed the patient and family, and examined the patient. The following aspects are pertinent.  Past Medical History:  Diagnosis Date  . HTN (hypertension)    Social History   Socioeconomic History  . Marital status: Married    Spouse name: Not on  file  . Number of children: Not on file  . Years of education: Not on file  . Highest education level: Not on file  Occupational History  . Not on file  Tobacco Use  . Smoking status: Never Smoker  . Smokeless tobacco: Never Used  Substance and Sexual Activity  . Alcohol use: Not Currently  . Drug use: Not on file  . Sexual activity: Not on file  Other Topics Concern  . Not on file  Social History Narrative  . Not on file   Social Determinants of Health   Financial Resource Strain:   . Difficulty of Paying Living Expenses: Not on file  Food Insecurity:   . Worried About Programme researcher, broadcasting/film/video in the Last Year: Not on file  . Ran Out of Food in the Last Year: Not on file  Transportation Needs:   . Lack of Transportation (Medical): Not on file  . Lack of Transportation (Non-Medical): Not on file  Physical Activity:   . Days of Exercise per Week: Not on file  . Minutes of Exercise per Session: Not on file  Stress:   . Feeling of Stress : Not on file  Social Connections:   . Frequency of Communication with Friends and Family: Not on file  . Frequency of Social Gatherings with Friends and Family: Not on file  . Attends  Religious Services: Not on file  . Active Member of Clubs or Organizations: Not on file  . Attends Banker Meetings: Not on file  . Marital Status: Not on file   History reviewed. No pertinent family history. Scheduled Meds: . amLODipine  2.5 mg Oral Daily  . atorvastatin  10 mg Oral Daily  . enoxaparin (LOVENOX) injection  40 mg Subcutaneous Q12H  . methylPREDNISolone (SOLU-MEDROL) injection  60 mg Intravenous Q12H  . pantoprazole  40 mg Oral Q1200  . sodium chloride flush  3 mL Intravenous Q12H   Continuous Infusions: PRN Meds:.acetaminophen, albuterol, chlorpheniramine-HYDROcodone, guaiFENesin-dextromethorphan, ondansetron (ZOFRAN) IV, oxymetazoline, polyethylene glycol, sodium chloride Medications Prior to Admission:  Prior to Admission medications   Medication Sig Start Date End Date Taking? Authorizing Provider  atorvastatin (LIPITOR) 80 MG tablet Take 80 mg by mouth daily. 08/19/20  Yes [provider]  azithromycin (ZITHROMAX) 250 MG tablet Take 250 mg by mouth as directed. 09/08/20  Yes [provider]  ondansetron (ZOFRAN) 4 MG tablet Take 4 mg by mouth at bedtime. 09/08/20  Yes [provider]  valsartan (DIOVAN) 160 MG tablet Take 160 mg by mouth daily. 07/01/20  Yes [provider]  ondansetron (ZOFRAN ODT) 4 MG disintegrating tablet Take 1 tablet (4 mg total) by mouth every 8 (eight) hours as needed for nausea or vomiting. Patient not taking: Reported on 09/13/2020 01/12/19   Belinda Fisher, PA-C  predniSONE (DELTASONE) 50 MG tablet Take 1 tablet (50 mg total) by mouth daily with breakfast. Patient not taking: Reported on 09/13/2020 01/12/19   Belinda Fisher, PA-C   No Known Allergies Review of Systems +fatigue + shortness of breath.   Physical Exam Awake alert resting in bed Lungs are clear to auscultation bilaterally S1-S2 Abdomen is nondistended No edema No focal deficits Mood and affect in my opinion appear to be within  normal limits.  Patient makes eye contact, responds appropriately.  Vital Signs: BP (!) 144/86 (BP Location: Right Arm)   Pulse 82   Temp 98.4 F (36.9 C) (Oral)   Resp 19   Ht 5\' 5"  (  1.651 m)   Wt 65.8 kg   SpO2 100%   BMI 24.13 kg/m  Pain Scale: 0-10 POSS *See Group Information*: 1-Acceptable,Awake and alert Pain Score: 0-No pain   SpO2: SpO2: 100 % O2 Device:SpO2: 100 % O2 Flow Rate: .O2 Flow Rate (L/min): (S) 12 L/min  IO: Intake/output summary:   Intake/Output Summary (Last 24 hours) at 09/18/2020 1252 Last data filed at 09/17/2020 1923 Gross per 24 hour  Intake 240 ml  Output 850 ml  Net -610 ml    LBM: Last BM Date: 09/17/20 Baseline Weight: Weight: 65.8 kg Most recent weight: Weight: 65.8 kg     Palliative Assessment/Data:   PPS 40%  Time In:  11 Time Out:  12.10 Time Total:  70  Greater than 50%  of this time was spent counseling and coordinating care related to the above assessment and plan.  Signed by: Rosalin Hawking, MD   Please contact Palliative Medicine Team phone at [phone] for questions and concerns.  For individual provider: See Loretha Stapler

## 2020-09-18 NOTE — Progress Notes (Signed)
PROGRESS NOTE                                                                                                                                                                                                             Larry Olsen Demographics:    Larry Olsen, is a 79 y.o. male, DOB - [birth year], OHY:[id number]  Outpatient Primary MD for the Larry Olsen is Larry Olsen, No Pcp Per   Admit date - 09/12/2020   LOS - 6  Chief Complaint  Larry Olsen presents with  . Shortness of Breath  . Covid Positive       Brief Narrative: Larry Olsen is a 78 y.o. male with PMHx of HTN, HLD-known Covid 19+ approximately 2 weeks prior to this hospital stay-presenting with severe shortness of breath-found to have severe acute hypoxemic respiratory failure requiring 15 L of HFNC in the setting of COVID-19 pneumonia.  See below for further details  COVID-19 vaccinated status: Unvaccinated  Significant Events: 10/26>> Admit to [facility] for severe hypoxia due to COVID-19 pneumonia, AKI and hyponatremia.  Significant studies: 10/26>>Chest x-ray: Diffuse interstitial airspace opacities 10/26>> CTA chest: No PE, multifocal pneumonia 10/27>> bilateral lower extremity Doppler: No DVT 10/27>> Echo: EF 55-60%, no wall motion abnormalities, small pericardial effusion  COVID-19 medications: Steroids: 10/26>> Remdesivir: 10/26>> 10/30 Actemra: 10/26 x 1  Antibiotics: None  Microbiology data: 10/26 >>blood culture: No growth  Procedures: None  Consults: None  DVT prophylaxis: Prophylactic Lovenox at intermediate dosing   Subjective:   Appears to have improved somewhat-Down to 12 L of salter high flow-no longer on heated high flow/NRB.  Although with decreased oxygen requirements-he still continues to have significant amount of exertional dyspnea-still continues to have SOA with minimal activity.  He is asking that he be transitioned to comfort measures-in  spite of having some significant decrease in his hypoxemia.   Assessment  & Plan :   Acute Hypoxic Resp Failure due to Covid 19 Viral pneumonia: Improvement in hypoxemia-downgraded from heated high flow to salter high flow 12 L.  Although oxygen requirements have improved-he is still very symptomatic and continues to have shortness of breath with minimal activity.  Remains on steroids-no signs of volume overload-does not require diuretics.    Although Larry Olsen has improvement in his hypoxemia-he is still very symptomatic-and is actually asking to be transitioned to hospice care-I have spent a  significant amount of time this morning with the Larry Olsen-explaining that decrease in his oxygen requirements are a significant issue-and that if this continues-there is a good chance that he may survive this hospitalization.  However he will still be symptomatic for a few weeks-and will require rehabilitation to see if he can get his prior functional baseline back.  Larry Olsen is not keen on going through the process of recuperation/rehab-and still feels very short of breath with minimal activity-and that he does not want to continue with the treatment process.I then reached out to the Larry Olsen's daughter-we both agreed to have palliative care MD see the Larry Olsen-and then take it from there.  Subsequently evaluated by palliative care with recommendations to continue with current course for another 24 hours and reassess   Fever: afebrile O2 requirements:  SpO2: 100 % O2 Flow Rate (L/min): (S) 12 L/min FiO2 (%): (S) 40 %   COVID-19 Labs: Recent Labs    09/16/20 0202 09/17/20 0347 09/18/20 0438  DDIMER 1.86* 1.69* 1.53*  FERRITIN 1,283* 1,281*  --   CRP 2.9* 1.6* 1.0*       Component Value Date/Time   BNP 240.6 (H) 09/12/2020 1342    Recent Labs  Lab 09/12/20 1341  PROCALCITON 0.72    Lab Results  Component Value Date   SARSCOV2NAA POSITIVE (A) 09/12/2020    Prone/Incentive Spirometry: encouraged  Larry Olsen to lie prone for 3-4 hours at a time for a total of 16 hours a day, and to encourage incentive spirometry use 3-4/hour.  Leukocytosis: Secondary to steroids-no indication for bacterial infection. Follow.  Elevated D-dimer: Secondary to COVID-19-CTA chest negative for PE/lower extremity Dopplers negative for DVT-continue intermediate dosing of Lovenox.  Follow.    AKI: Likely hemodynamically mediated-had resolved on 10/27-but then again worsened-no slowly downtrending with supportive care.  Avoid nephrotoxic agents.  Hyponatremia: Secondary to hypovolemia/dehydration-improved after gentle hydration.  Volume status remains stable.  Transaminitis: Secondary to COVID-19-appears mild-stable for close monitoring/follow-up.  Troponin elevation: Trend is flat-not consistent with ACS-likely demand ischemia due to severe hypoxemia/AKI-echo with preserved EF-no wall motion abnormality.  Doubt further work-up required while inpatient.  HTN: BP stable-continue amlodipine. Continue to hold losartan due to AKI.   HLD: Cautiously continue with statin-watch LFTs.  Palliative care discussion: DNR in place-multiple discussions with the Larry Olsen and daughter over the past few days.  He desires no further escalation in care-see above documentation-plan is to reassess tomorrow-to see if Larry Olsen still desires transitioning to full comfort measures.  Appreciate palliative care note.  GI prophylaxis: PPI  ABG: No results found for: PHART, PCO2ART, PO2ART, HCO3, TCO2, ACIDBASEDEF, O2SAT  Vent Settings: N/A FiO2 (%):  [40 %-60 %] 40 %  Condition - Extremely Guarded  Family Communication  : Daughter Maureen Ralphs [phone]) over the phone on 11/1.  Code Status:DNR  Diet :  Diet Order            Diet Heart Room service appropriate? Yes; Fluid consistency: Thin  Diet effective now                  Disposition Plan  :   Status is: Inpatient  Remains inpatient appropriate because:Inpatient level  of care appropriate due to severity of illness   Dispo: The Larry Olsen is from: Home              Anticipated d/c is to: TBD              Anticipated d/c date is: > 3 days  Larry Olsen currently is not medically stable to d/c.   Barriers to discharge: Hypoxia requiring O2 supplementation/complete 5 days of IV Remdesivir  Antimicorbials  :    Anti-infectives (From admission, onward)   Start     Dose/Rate Route Frequency Ordered Stop   09/13/20 1000  remdesivir 100 mg in sodium chloride 0.9 % 100 mL IVPB       "Followed by" Linked Group Details   100 mg 200 mL/hr over 30 Minutes Intravenous Daily 09/12/20 1509 09/16/20 0823   09/12/20 1630  remdesivir 200 mg in sodium chloride 0.9% 250 mL IVPB       "Followed by" Linked Group Details   200 mg 580 mL/hr over 30 Minutes Intravenous Once 09/12/20 1509 09/13/20 0915      Inpatient Medications  Scheduled Meds: . amLODipine  2.5 mg Oral Daily  . atorvastatin  10 mg Oral Daily  . enoxaparin (LOVENOX) injection  40 mg Subcutaneous Q12H  . methylPREDNISolone (SOLU-MEDROL) injection  60 mg Intravenous Q12H  . pantoprazole  40 mg Oral Q1200  . sodium chloride flush  3 mL Intravenous Q12H   Continuous Infusions:  PRN Meds:.acetaminophen, albuterol, chlorpheniramine-HYDROcodone, guaiFENesin-dextromethorphan, ondansetron (ZOFRAN) IV, oxymetazoline, polyethylene glycol, sodium chloride   Time Spent in minutes  35   See all Orders from today for further details   Jeoffrey Massed M.D on 09/18/2020 at 2:16 PM  To page go to [email or web address] - use universal password  [facility] -  Office  [phone]    Objective:   Vitals:   09/18/20 0743 09/18/20 0827 09/18/20 0917 09/18/20 1158  BP: (!) 152/90 (!) 152/90 (!) 145/82 (!) 144/86  Pulse: 96 (!) 105 95 82  Resp: (!) 22  18 19   Temp: 97.9 F (36.6 C)  97.7 F (36.5 C) 98.4 F (36.9 C)  TempSrc: Oral  Oral Oral  SpO2: 96%  100% 100%  Weight:      Height:         Wt Readings from Last 3 Encounters:  09/12/20 65.8 kg     Intake/Output Summary (Last 24 hours) at 09/18/2020 1416 Last data filed at 09/17/2020 1923 Gross per 24 hour  Intake 240 ml  Output 850 ml  Net -610 ml     Physical Exam Gen Exam:Alert awake-not in any distress HEENT:atraumatic, normocephalic Chest: B/L clear to auscultation anteriorly CVS:S1S2 regular Abdomen:soft non tender, non distended Extremities:no edema Neurology: Non focal Skin: no rash   Data Review:    CBC Recent Labs  Lab 09/13/20 0121 09/13/20 0121 09/14/20 0106 09/15/20 0119 09/16/20 0202 09/17/20 0347 09/18/20 0438  WBC 14.0*   < > 25.8* 28.4* 27.2* 30.0* 46.9*  HGB 12.7*   < > 13.6 14.3 13.7 14.0 13.8  HCT 35.6*   < > 38.1* 40.2 39.7 40.2 39.1  PLT 211   < > 316 400 352 356 333  MCV 86.8   < > 87.2 88.0 89.0 89.5 90.9  MCH 31.0   < > 31.1 31.3 30.7 31.2 32.1  MCHC 35.7   < > 35.7 35.6 34.5 34.8 35.3  RDW 12.6   < > 12.8 13.0 12.9 12.9 13.2  LYMPHSABS 0.2*  --  0.3* 0.0* 0.5* 0.5*  --   MONOABS 0.5  --  0.9 0.3 0.8 0.8  --   EOSABS 0.0  --  0.0 0.0 0.0 0.0  --   BASOSABS 0.0  --  0.1 0.0 0.0 0.1  --    < > = values in this  interval not displayed.    Chemistries  Recent Labs  Lab 09/13/20 0121 09/13/20 0745 09/14/20 0106 09/14/20 0106 09/14/20 1635 09/15/20 0119 09/16/20 0202 09/17/20 0347 09/18/20 0438  NA 124*   < > 126*   < > 128* 129* 131* 132* 131*  K 4.3   < > 4.4   < > 3.9 4.0 4.2 4.4 4.8  CL 94*   < > 95*   < > 95* 95* 97* 99 98  CO2 20*   < > 21*   < > 18* 19* 22 25 25   GLUCOSE 174*   < > 184*   < > 228* 177* 198* 164* 171*  BUN 13   < > 32*   < > 41* 41* 38* 38* 38*  CREATININE 0.96   < > 1.58*   < > 1.67* 1.66* 1.54* 1.50* 1.52*  CALCIUM 8.3*   < > 8.8*   < > 9.1 9.0 9.0 9.0 8.7*  MG 1.9  --   --   --   --   --   --   --   --   AST 80*  --  84*  --   --  87* 80* 73* 68*  ALT 48*  --  56*  --   --  59* 63* 68* 71*  ALKPHOS 88  --  102  --   --  113 111 107  121  BILITOT 1.4*  --  0.9  --   --  1.0 1.1 1.1 1.1   < > = values in this interval not displayed.   ------------------------------------------------------------------------------------------------------------------ No results for input(s): CHOL, HDL, LDLCALC, TRIG, CHOLHDL, LDLDIRECT in the last 72 hours.  No results found for: HGBA1C ------------------------------------------------------------------------------------------------------------------ No results for input(s): TSH, T4TOTAL, T3FREE, THYROIDAB in the last 72 hours.  Invalid input(s): FREET3 ------------------------------------------------------------------------------------------------------------------ Recent Labs    09/16/20 0202 09/17/20 0347  FERRITIN 1,283* 1,281*    Coagulation profile No results for input(s): INR, PROTIME in the last 168 hours.  Recent Labs    09/17/20 0347 09/18/20 0438  DDIMER 1.69* 1.53*    Cardiac Enzymes No results for input(s): CKMB, TROPONINI, MYOGLOBIN in the last 168 hours.  Invalid input(s): CK ------------------------------------------------------------------------------------------------------------------    Component Value Date/Time   BNP 240.6 (H) 09/12/2020 1342    Micro Results Recent Results (from the past 240 hour(s))  Blood Culture (routine x 2)     Status: None   Collection Time: 09/12/20  2:44 PM   Specimen: BLOOD LEFT ARM  Result Value Ref Range Status   Specimen Description BLOOD LEFT ARM  Final   Special Requests   Final    BOTTLES DRAWN AEROBIC AND ANAEROBIC Blood Culture results may not be optimal due to an inadequate volume of blood received in culture bottles   Culture   Final    NO GROWTH 5 DAYS Performed at [facility], [address] [place], [place] [address]    Report Status 09/17/2020 FINAL  Final  Blood Culture (routine x 2)     Status: None   Collection Time: 09/12/20  3:00 PM   Specimen: BLOOD RIGHT ARM  Result Value Ref Range  Status   Specimen Description BLOOD RIGHT ARM  Final   Special Requests   Final    BOTTLES DRAWN AEROBIC AND ANAEROBIC Blood Culture adequate volume   Culture   Final    NO GROWTH 5 DAYS Performed at [facility], [address] [place],  [place] [address]    Report Status 09/17/2020 FINAL  Final  Respiratory Panel by RT PCR (Flu A&B, Covid) - Nasopharyngeal Swab     Status: Abnormal   Collection Time: 09/12/20  3:08 PM   Specimen: Nasopharyngeal Swab  Result Value Ref Range Status   SARS Coronavirus 2 by RT PCR POSITIVE (A) NEGATIVE Final    Comment: emailed L. Berdik RN 17:00 09/12/20 ([email or web address]) (NOTE) SARS-CoV-2 target nucleic acids are DETECTED.  SARS-CoV-2 RNA is generally detectable in upper respiratory specimens  during the acute phase of infection. Positive results are indicative of the presence of the identified virus, but do not rule out bacterial infection or co-infection with other pathogens not detected by the test. Clinical correlation with Larry Olsen history and other diagnostic information is necessary to determine Larry Olsen infection status. The expected result is Negative.  Fact Sheet for Patients:  https://www.moore.com/  Fact Sheet for Healthcare Providers: https://www.young.biz/  This test is not yet approved or cleared by the [place] FDA and  has been authorized for detection and/or diagnosis of SARS-CoV-2 by FDA under an Emergency Use Authorization (EUA).  This EUA will remain in effect (meaning this test can be used) for the duration of  the COVID-19  declaration under Section 564(b)(1) of the Act, 21 U.S.C. section 360bbb-3(b)(1), unless the authorization is terminated or revoked sooner.      Influenza A by PCR NEGATIVE NEGATIVE Final   Influenza B by PCR NEGATIVE NEGATIVE Final    Comment: (NOTE) The Xpert Xpress SARS-CoV-2/FLU/RSV assay is intended as an aid in  the diagnosis of influenza from  Nasopharyngeal swab specimens and  should not be used as a sole basis for treatment. Nasal washings and  aspirates are unacceptable for Xpert Xpress SARS-CoV-2/FLU/RSV  testing.  Fact Sheet for Patients: https://www.moore.com/  Fact Sheet for Healthcare Providers: https://www.young.biz/  This test is not yet approved or cleared by the [place] FDA and  has been authorized for detection and/or diagnosis of SARS-CoV-2 by  FDA under an Emergency Use Authorization (EUA). This EUA will remain  in effect (meaning this test can be used) for the duration of the  Covid-19 declaration under Section 564(b)(1) of the Act, 21  U.S.C. section 360bbb-3(b)(1), unless the authorization is  terminated or revoked. Performed at [facility], [address], [place], [place] [address]     Radiology Reports CT Angio Chest PE W and/or Wo Contrast  Result Date: 09/12/2020 CLINICAL DATA:  79 year old male with elevated D-dimer. Concern for pulmonary embolism. EXAM: CT ANGIOGRAPHY CHEST WITH CONTRAST TECHNIQUE: Multidetector CT imaging of the chest was performed using the standard protocol during bolus administration of intravenous contrast. Multiplanar CT image reconstructions and MIPs were obtained to evaluate the vascular anatomy. CONTRAST:  75mL OMNIPAQUE IOHEXOL 350 MG/ML SOLN COMPARISON:  Chest radiograph dated 09/12/2020. FINDINGS: Evaluation of this exam is limited due to respiratory motion artifact. Cardiovascular: There is no cardiomegaly or pericardial effusion. There is 3 vessel coronary vascular calcification. Moderate atherosclerotic calcification of the thoracic aorta. No aneurysmal dilatation or dissection. The origins of the great vessels of the aortic arch appear patent as visualized. Evaluation of the pulmonary arteries is limited due to respiratory motion artifact as well as streak artifact caused by Larry Olsen's arms. No large or central pulmonary  artery embolus identified. Mediastinum/Nodes: No hilar or mediastinal adenopathy. The esophagus and the thyroid gland are grossly unremarkable. No mediastinal fluid collection. Lungs/Pleura: Extensive patchy bilateral pulmonary opacities with predominant involvement of the lower lobes  most consistent with multifocal pneumonia, likely viral or atypical in etiology including COVID-19. Clinical correlation is recommended. There are small bilateral pleural effusions. No pneumothorax. The central airways are patent. Upper Abdomen: No acute abnormality. Musculoskeletal: Degenerative changes of the spine. No acute osseous pathology. Review of the MIP images confirms the above findings. IMPRESSION: 1. No CT evidence of central pulmonary artery embolus. 2. Multifocal pneumonia, likely viral or atypical in etiology. Clinical correlation and follow-up to resolution recommended. 3. Small bilateral pleural effusions. 4. Aortic Atherosclerosis (ICD10-I70.0). Electronically Signed   By: Elgie Collard M.D.   On: 09/12/2020 19:05   DG Chest Port 1 View  Result Date: 09/12/2020 CLINICAL DATA:  Shortness of breath, COVID-19 positive EXAM: PORTABLE CHEST 1 VIEW COMPARISON:  10/07/2008 FINDINGS: Heart size within normal limits. Aortic atherosclerosis. There are diffuse interstitial opacities throughout the left lung, most confluent within the left lung base. Mild interstitial prominence within the right lung. No large pleural fluid collection. No pneumothorax. IMPRESSION: Diffuse interstitial opacities throughout the left lung, most confluent within the left lung base. Findings concerning for multifocal atypical/viral infection versus asymmetric edema. Electronically Signed   By: Duanne Guess D.O.   On: 09/12/2020 14:59   DG Chest Port 1V same Day  Result Date: 09/15/2020 CLINICAL DATA:  Shortness of breath.  COVID-19 positive EXAM: PORTABLE CHEST 1 VIEW COMPARISON:  Chest radiograph and chest CT September 12, 2020  FINDINGS: There is patchy airspace opacity throughout the lungs bilaterally, similar to recent prior studies. No consolidation. Heart size and pulmonary vascularity within normal limits. No adenopathy. There is aortic atherosclerosis. No bone lesions IMPRESSION: Persistent multifocal airspace opacity consistent with atypical organism pneumonia. No appreciable new opacity compared to recent studies. Stable cardiac silhouette. Aortic Atherosclerosis (ICD10-I70.0). Electronically Signed   By: Bretta Bang III M.D.   On: 09/15/2020 07:43   ECHOCARDIOGRAM COMPLETE  Result Date: 09/14/2020    ECHOCARDIOGRAM REPORT   Larry Olsen Name:   Larry Olsen Date of Exam: 09/13/2020 Medical Rec #:  [id number]       Height:       65.0 in Accession #:    [id number]      Weight:       145.0 lb Date of Birth:  01-22-1941       BSA:          1.725 m Larry Olsen Age:    79 years        BP:           141/76 mmHg Larry Olsen Gender: M               HR:           101 bpm. Exam Location:  Inpatient Procedure: 2D Echo, Cardiac Doppler and Color Doppler Indications:    Acute Respiratory Failure 518.82 / R06.89  History:        Larry Olsen has no prior history of Echocardiogram examinations.                 Risk Factors:Hypertension. Covid -19 Positive.  Sonographer:    Tiffany Dance Referring Phys: Jeoffrey Massed, M IMPRESSIONS  1. Left ventricular ejection fraction, by estimation, is 55 to 60%. The left ventricle has normal function. The left ventricle has no regional wall motion abnormalities. There is mild concentric left ventricular hypertrophy. Left ventricular diastolic parameters were normal.  2. Right ventricular systolic function is normal. The right ventricular size is normal. There is normal pulmonary artery systolic pressure. The estimated right ventricular  systolic pressure is 24.5 mmHg.  3. A small pericardial effusion is present. There is no evidence of cardiac tamponade.  4. The mitral valve is normal in structure. Trivial mitral  valve regurgitation. No evidence of mitral stenosis.  5. The aortic valve is tricuspid. There is mild calcification of the aortic valve. Aortic valve regurgitation is not visualized. Mild aortic valve sclerosis is present, with no evidence of aortic valve stenosis.  6. The inferior vena cava is normal in size with greater than 50% respiratory variability, suggesting right atrial pressure of 3 mmHg. Comparison(s): No prior Echocardiogram. Conclusion(s)/Recommendation(s): Otherwise normal echocardiogram, with minor abnormalities described in the report. FINDINGS  Left Ventricle: Left ventricular ejection fraction, by estimation, is 55 to 60%. The left ventricle has normal function. The left ventricle has no regional wall motion abnormalities. The left ventricular internal cavity size was normal in size. There is  mild concentric left ventricular hypertrophy. Left ventricular diastolic parameters were normal. Right Ventricle: The right ventricular size is normal. No increase in right ventricular wall thickness. Right ventricular systolic function is normal. There is normal pulmonary artery systolic pressure. The tricuspid regurgitant velocity is 2.32 m/s, and  with an assumed right atrial pressure of 3 mmHg, the estimated right ventricular systolic pressure is 24.5 mmHg. Left Atrium: Left atrial size was normal in size. Right Atrium: Right atrial size was normal in size. Pericardium: A small pericardial effusion is present. There is no evidence of cardiac tamponade. Mitral Valve: The mitral valve is normal in structure. Trivial mitral valve regurgitation. No evidence of mitral valve stenosis. Tricuspid Valve: The tricuspid valve is normal in structure. Tricuspid valve regurgitation is trivial. No evidence of tricuspid stenosis. Aortic Valve: The aortic valve is tricuspid. There is mild calcification of the aortic valve. Aortic valve regurgitation is not visualized. Mild aortic valve sclerosis is present, with no  evidence of aortic valve stenosis. Pulmonic Valve: The pulmonic valve was not well visualized. Pulmonic valve regurgitation is not visualized. No evidence of pulmonic stenosis. Aorta: The aortic root, ascending aorta and aortic arch are all structurally normal, with no evidence of dilitation or obstruction. Venous: The inferior vena cava is normal in size with greater than 50% respiratory variability, suggesting right atrial pressure of 3 mmHg. IAS/Shunts: No atrial level shunt detected by color flow Doppler.  LEFT VENTRICLE PLAX 2D LVIDd:         4.00 cm LVIDs:         2.77 cm LV PW:         1.24 cm LV IVS:        1.16 cm LVOT diam:     2.00 cm LV SV:         51 LV SV Index:   30 LVOT Area:     3.14 cm  RIGHT VENTRICLE          IVC RV Basal diam:  2.58 cm  IVC diam: 1.42 cm TAPSE (M-mode): 2.4 cm LEFT ATRIUM             Index       RIGHT ATRIUM           Index LA diam:        3.30 cm 1.91 cm/m  RA Area:     12.00 cm LA Vol (A2C):   41.1 ml 23.82 ml/m RA Volume:   23.40 ml  13.56 ml/m LA Vol (A4C):   34.8 ml 20.17 ml/m LA Biplane Vol: 40.1 ml 23.24 ml/m  AORTIC VALVE  LVOT Vmax:   92.15 cm/s LVOT Vmean:  59.400 cm/s LVOT VTI:    0.163 m  AORTA Ao Root diam: 3.50 cm Ao Asc diam:  3.60 cm MITRAL VALVE               TRICUSPID VALVE MV Area (PHT): 2.76 cm    TR Peak grad:   21.5 mmHg MV Decel Time: 275 msec    TR Vmax:        232.00 cm/s MV E velocity: 60.60 cm/s MV A velocity: 85.70 cm/s  SHUNTS MV E/A ratio:  0.71        Systemic VTI:  0.16 m                            Systemic Diam: 2.00 cm Jodelle Red MD Electronically signed by Jodelle Red MD Signature Date/Time: 09/14/2020/8:03:57 AM    Final    VAS [place] LOWER EXTREMITY VENOUS (DVT)  Result Date: 09/13/2020  Lower Venous DVTStudy Indications: Elevated Ddimer.  Risk Factors: COVID 19 positive. Comparison Study: No prior studies. Performing Technologist: Chanda Busing RVT  Examination Guidelines: A complete evaluation includes B-mode  imaging, spectral Doppler, color Doppler, and power Doppler as needed of all accessible portions of each vessel. Bilateral testing is considered an integral part of a complete examination. Limited examinations for reoccurring indications may be performed as noted. The reflux portion of the exam is performed with the Larry Olsen in reverse Trendelenburg.  +---------+---------------+---------+-----------+----------+--------------+ RIGHT    CompressibilityPhasicitySpontaneityPropertiesThrombus Aging +---------+---------------+---------+-----------+----------+--------------+ CFV      Full           Yes      Yes                                 +---------+---------------+---------+-----------+----------+--------------+ SFJ      Full                                                        +---------+---------------+---------+-----------+----------+--------------+ FV Prox  Full                                                        +---------+---------------+---------+-----------+----------+--------------+ FV Mid   Full                                                        +---------+---------------+---------+-----------+----------+--------------+ FV DistalFull                                                        +---------+---------------+---------+-----------+----------+--------------+ PFV      Full                                                        +---------+---------------+---------+-----------+----------+--------------+  POP      Full           Yes      Yes                                 +---------+---------------+---------+-----------+----------+--------------+ PTV      Full                                                        +---------+---------------+---------+-----------+----------+--------------+ PERO     Full                                                        +---------+---------------+---------+-----------+----------+--------------+    +---------+---------------+---------+-----------+----------+--------------+ LEFT     CompressibilityPhasicitySpontaneityPropertiesThrombus Aging +---------+---------------+---------+-----------+----------+--------------+ CFV      Full           Yes      Yes                                 +---------+---------------+---------+-----------+----------+--------------+ SFJ      Full                                                        +---------+---------------+---------+-----------+----------+--------------+ FV Prox  Full                                                        +---------+---------------+---------+-----------+----------+--------------+ FV Mid   Full                                                        +---------+---------------+---------+-----------+----------+--------------+ FV DistalFull                                                        +---------+---------------+---------+-----------+----------+--------------+ PFV      Full                                                        +---------+---------------+---------+-----------+----------+--------------+ POP      Full           Yes      Yes                                 +---------+---------------+---------+-----------+----------+--------------+  PTV      Full                                                        +---------+---------------+---------+-----------+----------+--------------+ PERO     Full                                                        +---------+---------------+---------+-----------+----------+--------------+     Summary: RIGHT: - There is no evidence of deep vein thrombosis in the lower extremity.  - No cystic structure found in the popliteal fossa.  LEFT: - There is no evidence of deep vein thrombosis in the lower extremity.  - No cystic structure found in the popliteal fossa.  *See table(s) above for measurements and observations. Electronically signed  by Coral Else MD on 09/13/2020 at 8:53:51 PM.    Final

## 2020-09-18 NOTE — Progress Notes (Signed)
Pt taken off [facility] and placed on [facility] at 12 L. Pt spo2 100%. Will wean as tolerated and for pt comfort.

## 2020-09-19 DIAGNOSIS — J9601 Acute respiratory failure with hypoxia: Secondary | ICD-10-CM | POA: Diagnosis not present

## 2020-09-19 DIAGNOSIS — Z515 Encounter for palliative care: Secondary | ICD-10-CM | POA: Diagnosis not present

## 2020-09-19 DIAGNOSIS — I248 Other forms of acute ischemic heart disease: Secondary | ICD-10-CM | POA: Diagnosis not present

## 2020-09-19 DIAGNOSIS — Z7189 Other specified counseling: Secondary | ICD-10-CM | POA: Diagnosis not present

## 2020-09-19 DIAGNOSIS — N179 Acute kidney failure, unspecified: Secondary | ICD-10-CM | POA: Diagnosis not present

## 2020-09-19 DIAGNOSIS — U071 COVID-19: Secondary | ICD-10-CM | POA: Diagnosis not present

## 2020-09-19 LAB — CBC
HCT: 38.1 % — ABNORMAL LOW (ref 39.0–52.0)
Hemoglobin: 13 g/dL (ref 13.0–17.0)
MCH: 31.2 pg (ref 26.0–34.0)
MCHC: 34.1 g/dL (ref 30.0–36.0)
MCV: 91.4 fL (ref 80.0–100.0)
Platelets: 293 10*3/uL (ref 150–400)
RBC: 4.17 MIL/uL — ABNORMAL LOW (ref 4.22–5.81)
RDW: 12.8 % (ref 11.5–15.5)
WBC: 32.2 10*3/uL — ABNORMAL HIGH (ref 4.0–10.5)
nRBC: 0 % (ref 0.0–0.2)

## 2020-09-19 LAB — COMPREHENSIVE METABOLIC PANEL
ALT: 55 U/L — ABNORMAL HIGH (ref 0–44)
AST: 43 U/L — ABNORMAL HIGH (ref 15–41)
Albumin: 2.6 g/dL — ABNORMAL LOW (ref 3.5–5.0)
Alkaline Phosphatase: 106 U/L (ref 38–126)
Anion gap: 11 (ref 5–15)
BUN: 40 mg/dL — ABNORMAL HIGH (ref 8–23)
CO2: 25 mmol/L (ref 22–32)
Calcium: 8.6 mg/dL — ABNORMAL LOW (ref 8.9–10.3)
Chloride: 97 mmol/L — ABNORMAL LOW (ref 98–111)
Creatinine, Ser: 1.43 mg/dL — ABNORMAL HIGH (ref 0.61–1.24)
GFR, Estimated: 49 mL/min — ABNORMAL LOW (ref >60)
Glucose, Bld: 161 mg/dL — ABNORMAL HIGH (ref 70–99)
Potassium: 4.4 mmol/L (ref 3.5–5.1)
Sodium: 133 mmol/L — ABNORMAL LOW (ref 135–145)
Total Bilirubin: 0.7 mg/dL (ref 0.3–1.2)
Total Protein: 5.4 g/dL — ABNORMAL LOW (ref 6.5–8.1)

## 2020-09-19 LAB — GLUCOSE, CAPILLARY: Glucose-Capillary: 151 mg/dL — ABNORMAL HIGH (ref 70–99)

## 2020-09-19 LAB — C-REACTIVE PROTEIN: CRP: 0.7 mg/dL (ref <1.0)

## 2020-09-19 LAB — D-DIMER, QUANTITATIVE: D-Dimer, Quant: 0.68 ug/mL-FEU — ABNORMAL HIGH (ref 0.00–0.50)

## 2020-09-19 MED ORDER — METHYLPREDNISOLONE SODIUM SUCC 40 MG IJ SOLR
40.0000 mg | Freq: Two times a day (BID) | INTRAMUSCULAR | Status: DC
  Administered 2020-09-19 – 2020-09-23 (×10): 40 mg via INTRAVENOUS
  Filled 2020-09-19 (×10): qty 1

## 2020-09-19 MED ORDER — CLONAZEPAM 0.5 MG PO TABS
0.5000 mg | ORAL_TABLET | Freq: Three times a day (TID) | ORAL | Status: DC | PRN
  Administered 2020-09-21 – 2020-09-22 (×2): 0.5 mg via ORAL
  Filled 2020-09-19 (×2): qty 1

## 2020-09-19 NOTE — Progress Notes (Signed)
PROGRESS NOTE                                                                                                                                                                                                             Patient Demographics:    Larry Olsen, is a 79 y.o. male, DOB - 08/20/1941, IRJ:[id number]  Outpatient Primary MD for the patient is Patient, No Pcp Per   Admit date - 09/12/2020   LOS - 7  Chief Complaint  Patient presents with  . Shortness of Breath  . Covid Positive       Brief Narrative: Patient is a 79 y.o. male with PMHx of HTN, HLD-known Covid 19+ approximately 2 weeks prior to this hospital stay-presenting with severe shortness of breath-found to have severe acute hypoxemic respiratory failure requiring 15 L of HFNC in the setting of COVID-19 pneumonia.  See below for further details  COVID-19 vaccinated status: Unvaccinated  Significant Events: 10/26>> [facility] for severe hypoxia due to COVID-19 pneumonia, AKI and hyponatremia.  Significant studies: 10/26>>Chest x-ray: Diffuse interstitial airspace opacities 10/26>> CTA chest: No PE, multifocal pneumonia 10/27>> bilateral lower extremity Doppler: No DVT 10/27>> Echo: EF 55-60%, no wall motion abnormalities, small pericardial effusion  COVID-19 medications: Steroids: 10/26>> Remdesivir: 10/26>> 10/30 Actemra: 10/26 x 1  Antibiotics: None  Microbiology data: 10/26 >>blood culture: No growth  Procedures: None  Consults: None  DVT prophylaxis: Prophylactic Lovenox at intermediate dosing   Subjective:   Appears frail/deconditioned and debilitated-but hypoxia has improved-he was down to 5-6 L of HFNC today.   Assessment  & Plan :   Acute Hypoxic Resp Failure due to Covid 19 Viral pneumonia: Hypoxia continues to improve-on 5-6 L of HFNC today.  Still short of breath with minimal activity-deconditioned-but clearly on the  track to improvement.  No indication for diuretics/volume status stable-continue steroids-but taper somewhat.    Given significant improvement in hypoxemia-and after discussion with patient-we are not going to pursue comfort measures.  Patient is agreeable.    Fever: afebrile O2 requirements:  SpO2: 100 % O2 Flow Rate (L/min): 8 L/min FiO2 (%): (S) 40 %   COVID-19 Labs: Recent Labs    09/17/20 0347 09/18/20 0438 09/19/20 0208  DDIMER 1.69* 1.53* 0.68*  FERRITIN 1,281*  --   --   CRP 1.6* 1.0* 0.7  Component Value Date/Time   BNP 240.6 (H) 09/12/2020 1342    Recent Labs  Lab 09/12/20 1341  PROCALCITON 0.72    Lab Results  Component Value Date   SARSCOV2NAA POSITIVE (A) 09/12/2020    Prone/Incentive Spirometry: encouraged patient to lie prone for 3-4 hours at a time for a total of 16 hours a day, and to encourage incentive spirometry use 3-4/hour.  Leukocytosis: Secondary to steroids-no indication for bacterial infection. Follow.  Elevated D-dimer: Secondary to COVID-19-CTA chest negative for PE/lower extremity Dopplers negative for DVT-continue intermediate dosing of Lovenox.  Follow.    AKI: Likely hemodynamically mediated-had resolved on 10/27-but then again worsened-no slowly downtrending with supportive care.  Avoid nephrotoxic agents.  Hyponatremia: Secondary to hypovolemia/dehydration-improved after gentle hydration.  Volume status remains stable.  Transaminitis: Secondary to COVID-19-appears mild-stable for close monitoring/follow-up.  Troponin elevation: Trend is flat-not consistent with ACS-likely demand ischemia due to severe hypoxemia/AKI-echo with preserved EF-no wall motion abnormality.  Doubt further work-up required while inpatient.  HTN: BP stable-continue amlodipine. Continue to hold losartan due to AKI.   HLD: Cautiously continue with statin-watch LFTs.  Palliative care discussion: DNR in place-multiple discussions with the patient and  daughter over the past few days.  He does not desire aggressive care-he actually had indicated that he wanted to be transitioned to comfort measures but given clinical improvement-agrees that we ought to continue with current measures including PT/OT.  GI prophylaxis: PPI  ABG: No results found for: PHART, PCO2ART, PO2ART, HCO3, TCO2, ACIDBASEDEF, O2SAT  Vent Settings: N/A    Condition - Extremely Guarded  Family Communication  : Daughter Maureen Ralphs [phone]) over the phone on 11/2  Code Status:DNR  Diet :  Diet Order            Diet Heart Room service appropriate? Yes; Fluid consistency: Thin  Diet effective now                  Disposition Plan  :   Status is: Inpatient  Remains inpatient appropriate because:Inpatient level of care appropriate due to severity of illness   Dispo: The patient is from: Home              Anticipated d/c is to: TBD              Anticipated d/c date is: > 3 days              Patient currently is not medically stable to d/c.   Barriers to discharge: Hypoxia requiring O2 supplementation/complete 5 days of IV Remdesivir  Antimicorbials  :    Anti-infectives (From admission, onward)   Start     Dose/Rate Route Frequency Ordered Stop   09/13/20 1000  remdesivir 100 mg in sodium chloride 0.9 % 100 mL IVPB       "Followed by" Linked Group Details   100 mg 200 mL/hr over 30 Minutes Intravenous Daily 09/12/20 1509 09/16/20 0823   09/12/20 1630  remdesivir 200 mg in sodium chloride 0.9% 250 mL IVPB       "Followed by" Linked Group Details   200 mg 580 mL/hr over 30 Minutes Intravenous Once 09/12/20 1509 09/13/20 0915      Inpatient Medications  Scheduled Meds: . amLODipine  2.5 mg Oral Daily  . atorvastatin  10 mg Oral Daily  . enoxaparin (LOVENOX) injection  40 mg Subcutaneous Q12H  . methylPREDNISolone (SOLU-MEDROL) injection  40 mg Intravenous Q12H  . pantoprazole  40 mg Oral Q1200  . sodium  chloride flush  3 mL Intravenous Q12H     Continuous Infusions:  PRN Meds:.acetaminophen, albuterol, chlorpheniramine-HYDROcodone, guaiFENesin-dextromethorphan, ondansetron (ZOFRAN) IV, oxymetazoline, polyethylene glycol, sodium chloride   Time Spent in minutes  35   See all Orders from today for further details   Jeoffrey Massed M.D on 09/19/2020 at 11:40 AM  To page go to [email or web address] - use universal password  [facility] -  Office  [phone]    Objective:   Vitals:   09/18/20 2300 09/19/20 0010 09/19/20 0422 09/19/20 0744  BP:  (!) 146/84 127/81 134/83  Pulse: 95 98 78 77  Resp: 20 (!) 21 15 16   Temp:  98.1 F (36.7 C) 98.2 F (36.8 C) 98 F (36.7 C)  TempSrc:  Oral Axillary Oral  SpO2: 100% 96% 100% 100%  Weight:      Height:        Wt Readings from Last 3 Encounters:  09/12/20 65.8 kg     Intake/Output Summary (Last 24 hours) at 09/19/2020 1140 Last data filed at 09/18/2020 2300 Gross per 24 hour  Intake 240 ml  Output 2000 ml  Net -1760 ml     Physical Exam Gen Exam:Alert awake-not in any distress HEENT:atraumatic, normocephalic Chest: B/L clear to auscultation anteriorly CVS:S1S2 regular Abdomen:soft non tender, non distended Extremities:no edema Neurology: Non focal Skin: no rash  Data Review:    CBC Recent Labs  Lab 09/13/20 0121 09/13/20 0121 09/14/20 0106 09/14/20 0106 09/15/20 0119 09/16/20 0202 09/17/20 0347 09/18/20 0438 09/19/20 0208  WBC 14.0*   < > 25.8*   < > 28.4* 27.2* 30.0* 46.9* 32.2*  HGB 12.7*   < > 13.6   < > 14.3 13.7 14.0 13.8 13.0  HCT 35.6*   < > 38.1*   < > 40.2 39.7 40.2 39.1 38.1*  PLT 211   < > 316   < > 400 352 356 333 293  MCV 86.8   < > 87.2   < > 88.0 89.0 89.5 90.9 91.4  MCH 31.0   < > 31.1   < > 31.3 30.7 31.2 32.1 31.2  MCHC 35.7   < > 35.7   < > 35.6 34.5 34.8 35.3 34.1  RDW 12.6   < > 12.8   < > 13.0 12.9 12.9 13.2 12.8  LYMPHSABS 0.2*  --  0.3*  --  0.0* 0.5* 0.5*  --   --   MONOABS 0.5  --  0.9  --  0.3 0.8 0.8  --   --    EOSABS 0.0  --  0.0  --  0.0 0.0 0.0  --   --   BASOSABS 0.0  --  0.1  --  0.0 0.0 0.1  --   --    < > = values in this interval not displayed.    Chemistries  Recent Labs  Lab 09/13/20 0121 09/13/20 0745 09/15/20 0119 09/16/20 0202 09/17/20 0347 09/18/20 0438 09/19/20 0208  NA 124*   < > 129* 131* 132* 131* 133*  K 4.3   < > 4.0 4.2 4.4 4.8 4.4  CL 94*   < > 95* 97* 99 98 97*  CO2 20*   < > 19* 22 25 25 25   GLUCOSE 174*   < > 177* 198* 164* 171* 161*  BUN 13   < > 41* 38* 38* 38* 40*  CREATININE 0.96   < > 1.66* 1.54* 1.50* 1.52* 1.43*  CALCIUM 8.3*   < > 9.0 9.0  9.0 8.7* 8.6*  MG 1.9  --   --   --   --   --   --   AST 80*   < > 87* 80* 73* 68* 43*  ALT 48*   < > 59* 63* 68* 71* 55*  ALKPHOS 88   < > 113 111 107 121 106  BILITOT 1.4*   < > 1.0 1.1 1.1 1.1 0.7   < > = values in this interval not displayed.   ------------------------------------------------------------------------------------------------------------------ No results for input(s): CHOL, HDL, LDLCALC, TRIG, CHOLHDL, LDLDIRECT in the last 72 hours.  No results found for: HGBA1C ------------------------------------------------------------------------------------------------------------------ No results for input(s): TSH, T4TOTAL, T3FREE, THYROIDAB in the last 72 hours.  Invalid input(s): FREET3 ------------------------------------------------------------------------------------------------------------------ Recent Labs    09/17/20 0347  FERRITIN 1,281*    Coagulation profile No results for input(s): INR, PROTIME in the last 168 hours.  Recent Labs    09/18/20 0438 09/19/20 0208  DDIMER 1.53* 0.68*    Cardiac Enzymes No results for input(s): CKMB, TROPONINI, MYOGLOBIN in the last 168 hours.  Invalid input(s): CK ------------------------------------------------------------------------------------------------------------------    Component Value Date/Time   BNP 240.6 (H) 09/12/2020 1342     Micro Results Recent Results (from the past 240 hour(s))  Blood Culture (routine x 2)     Status: None   Collection Time: 09/12/20  2:44 PM   Specimen: BLOOD LEFT ARM  Result Value Ref Range Status   Specimen Description BLOOD LEFT ARM  Final   Special Requests   Final    BOTTLES DRAWN AEROBIC AND ANAEROBIC Blood Culture results may not be optimal due to an inadequate volume of blood received in culture bottles   Culture   Final    NO GROWTH 5 DAYS Performed at [facility], [address], [place], [place] [address]    Report Status 09/17/2020 FINAL  Final  Blood Culture (routine x 2)     Status: None   Collection Time: 09/12/20  3:00 PM   Specimen: BLOOD RIGHT ARM  Result Value Ref Range Status   Specimen Description BLOOD RIGHT ARM  Final   Special Requests   Final    BOTTLES DRAWN AEROBIC AND ANAEROBIC Blood Culture adequate volume   Culture   Final    NO GROWTH 5 DAYS Performed at [facility], [address] [place], [place] [address]    Report Status 09/17/2020 FINAL  Final  Respiratory Panel by RT PCR (Flu A&B, Covid) - Nasopharyngeal Swab     Status: Abnormal   Collection Time: 09/12/20  3:08 PM   Specimen: Nasopharyngeal Swab  Result Value Ref Range Status   SARS Coronavirus 2 by RT PCR POSITIVE (A) NEGATIVE Final    Comment: emailed L. Berdik RN 17:00 09/12/20 ([email or web address]) (NOTE) SARS-CoV-2 target nucleic acids are DETECTED.  SARS-CoV-2 RNA is generally detectable in upper respiratory specimens  during the acute phase of infection. Positive results are indicative of the presence of the identified virus, but do not rule out bacterial infection or co-infection with other pathogens not detected by the test. Clinical correlation with patient history and other diagnostic information is necessary to determine patient infection status. The expected result is Negative.  Fact Sheet for Patients:  https://www.moore.com/  Fact Sheet for  Healthcare Providers: https://www.young.biz/  This test is not yet approved or cleared by the [place] FDA and  has been authorized for detection and/or diagnosis of SARS-CoV-2 by FDA under an Emergency Use Authorization (EUA).  This EUA will remain in effect (meaning this test can be used) for the duration of  the COVID-19  declaration under Section 564(b)(1) of the Act, 21 U.S.C. section 360bbb-3(b)(1), unless the authorization is terminated or revoked sooner.      Influenza A by PCR NEGATIVE NEGATIVE Final   Influenza B by PCR NEGATIVE NEGATIVE Final    Comment: (NOTE) The Xpert Xpress SARS-CoV-2/FLU/RSV assay is intended as an aid in  the diagnosis of influenza from Nasopharyngeal swab specimens and  should not be used as a sole basis for treatment. Nasal washings and  aspirates are unacceptable for Xpert Xpress SARS-CoV-2/FLU/RSV  testing.  Fact Sheet for Patients: https://www.moore.com/  Fact Sheet for Healthcare Providers: https://www.young.biz/  This test is not yet approved or cleared by the [place] FDA and  has been authorized for detection and/or diagnosis of SARS-CoV-2 by  FDA under an Emergency Use Authorization (EUA). This EUA will remain  in effect (meaning this test can be used) for the duration of the  Covid-19 declaration under Section 564(b)(1) of the Act, 21  U.S.C. section 360bbb-3(b)(1), unless the authorization is  terminated or revoked. Performed at [facility], [address], [place], [place] [address]     Radiology Reports CT Angio Chest PE W and/or Wo Contrast  Result Date: 09/12/2020 CLINICAL DATA:  79 year old male with elevated D-dimer. Concern for pulmonary embolism. EXAM: CT ANGIOGRAPHY CHEST WITH CONTRAST TECHNIQUE: Multidetector CT imaging of the chest was performed using the standard protocol during bolus administration of intravenous contrast. Multiplanar CT  image reconstructions and MIPs were obtained to evaluate the vascular anatomy. CONTRAST:  41mL OMNIPAQUE IOHEXOL 350 MG/ML SOLN COMPARISON:  Chest radiograph dated 09/12/2020. FINDINGS: Evaluation of this exam is limited due to respiratory motion artifact. Cardiovascular: There is no cardiomegaly or pericardial effusion. There is 3 vessel coronary vascular calcification. Moderate atherosclerotic calcification of the thoracic aorta. No aneurysmal dilatation or dissection. The origins of the great vessels of the aortic arch appear patent as visualized. Evaluation of the pulmonary arteries is limited due to respiratory motion artifact as well as streak artifact caused by patient's arms. No large or central pulmonary artery embolus identified. Mediastinum/Nodes: No hilar or mediastinal adenopathy. The esophagus and the thyroid gland are grossly unremarkable. No mediastinal fluid collection. Lungs/Pleura: Extensive patchy bilateral pulmonary opacities with predominant involvement of the lower lobes most consistent with multifocal pneumonia, likely viral or atypical in etiology including COVID-19. Clinical correlation is recommended. There are small bilateral pleural effusions. No pneumothorax. The central airways are patent. Upper Abdomen: No acute abnormality. Musculoskeletal: Degenerative changes of the spine. No acute osseous pathology. Review of the MIP images confirms the above findings. IMPRESSION: 1. No CT evidence of central pulmonary artery embolus. 2. Multifocal pneumonia, likely viral or atypical in etiology. Clinical correlation and follow-up to resolution recommended. 3. Small bilateral pleural effusions. 4. Aortic Atherosclerosis (ICD10-I70.0). Electronically Signed   By: Elgie Collard M.D.   On: 09/12/2020 19:05   DG Chest Port 1 View  Result Date: 09/12/2020 CLINICAL DATA:  Shortness of breath, COVID-19 positive EXAM: PORTABLE CHEST 1 VIEW COMPARISON:  10/07/2008 FINDINGS: Heart size within normal  limits. Aortic atherosclerosis. There are diffuse interstitial opacities throughout the left lung, most confluent within the left lung base. Mild interstitial prominence within the right lung. No large pleural fluid collection. No pneumothorax. IMPRESSION: Diffuse interstitial opacities throughout the left lung, most confluent within the left lung base. Findings concerning for multifocal atypical/viral infection versus asymmetric edema.  Electronically Signed   By: Duanne Guess D.O.   On: 09/12/2020 14:59   DG Chest Port 1V same Day  Result Date: 09/15/2020 CLINICAL DATA:  Shortness of breath.  COVID-19 positive EXAM: PORTABLE CHEST 1 VIEW COMPARISON:  Chest radiograph and chest CT September 12, 2020 FINDINGS: There is patchy airspace opacity throughout the lungs bilaterally, similar to recent prior studies. No consolidation. Heart size and pulmonary vascularity within normal limits. No adenopathy. There is aortic atherosclerosis. No bone lesions IMPRESSION: Persistent multifocal airspace opacity consistent with atypical organism pneumonia. No appreciable new opacity compared to recent studies. Stable cardiac silhouette. Aortic Atherosclerosis (ICD10-I70.0). Electronically Signed   By: Bretta Bang III M.D.   On: 09/15/2020 07:43   ECHOCARDIOGRAM COMPLETE  Result Date: 09/14/2020    ECHOCARDIOGRAM REPORT   Patient Name:   FREDI GEILER Date of Exam: 09/13/2020 Medical Rec #:  [id number]       Height:       65.0 in Accession #:    [id number]      Weight:       145.0 lb Date of Birth:  August 15, 1941       BSA:          1.725 m Patient Age:    79 years        BP:           141/76 mmHg Patient Gender: M               HR:           101 bpm. Exam Location:  Inpatient Procedure: 2D Echo, Cardiac Doppler and Color Doppler Indications:    Acute Respiratory Failure 518.82 / R06.89  History:        Patient has no prior history of Echocardiogram examinations.                 Risk Factors:Hypertension. Covid -19  Positive.  Sonographer:    Tiffany Dance Referring Phys: Jeoffrey Massed, M IMPRESSIONS  1. Left ventricular ejection fraction, by estimation, is 55 to 60%. The left ventricle has normal function. The left ventricle has no regional wall motion abnormalities. There is mild concentric left ventricular hypertrophy. Left ventricular diastolic parameters were normal.  2. Right ventricular systolic function is normal. The right ventricular size is normal. There is normal pulmonary artery systolic pressure. The estimated right ventricular systolic pressure is 24.5 mmHg.  3. A small pericardial effusion is present. There is no evidence of cardiac tamponade.  4. The mitral valve is normal in structure. Trivial mitral valve regurgitation. No evidence of mitral stenosis.  5. The aortic valve is tricuspid. There is mild calcification of the aortic valve. Aortic valve regurgitation is not visualized. Mild aortic valve sclerosis is present, with no evidence of aortic valve stenosis.  6. The inferior vena cava is normal in size with greater than 50% respiratory variability, suggesting right atrial pressure of 3 mmHg. Comparison(s): No prior Echocardiogram. Conclusion(s)/Recommendation(s): Otherwise normal echocardiogram, with minor abnormalities described in the report. FINDINGS  Left Ventricle: Left ventricular ejection fraction, by estimation, is 55 to 60%. The left ventricle has normal function. The left ventricle has no regional wall motion abnormalities. The left ventricular internal cavity size was normal in size. There is  mild concentric left ventricular hypertrophy. Left ventricular diastolic parameters were normal. Right Ventricle: The right ventricular size is normal. No increase in right ventricular wall thickness. Right ventricular systolic function is normal. There is normal pulmonary artery systolic pressure. The  tricuspid regurgitant velocity is 2.32 m/s, and  with an assumed right atrial pressure of 3 mmHg, the  estimated right ventricular systolic pressure is 24.5 mmHg. Left Atrium: Left atrial size was normal in size. Right Atrium: Right atrial size was normal in size. Pericardium: A small pericardial effusion is present. There is no evidence of cardiac tamponade. Mitral Valve: The mitral valve is normal in structure. Trivial mitral valve regurgitation. No evidence of mitral valve stenosis. Tricuspid Valve: The tricuspid valve is normal in structure. Tricuspid valve regurgitation is trivial. No evidence of tricuspid stenosis. Aortic Valve: The aortic valve is tricuspid. There is mild calcification of the aortic valve. Aortic valve regurgitation is not visualized. Mild aortic valve sclerosis is present, with no evidence of aortic valve stenosis. Pulmonic Valve: The pulmonic valve was not well visualized. Pulmonic valve regurgitation is not visualized. No evidence of pulmonic stenosis. Aorta: The aortic root, ascending aorta and aortic arch are all structurally normal, with no evidence of dilitation or obstruction. Venous: The inferior vena cava is normal in size with greater than 50% respiratory variability, suggesting right atrial pressure of 3 mmHg. IAS/Shunts: No atrial level shunt detected by color flow Doppler.  LEFT VENTRICLE PLAX 2D LVIDd:         4.00 cm LVIDs:         2.77 cm LV PW:         1.24 cm LV IVS:        1.16 cm LVOT diam:     2.00 cm LV SV:         51 LV SV Index:   30 LVOT Area:     3.14 cm  RIGHT VENTRICLE          IVC RV Basal diam:  2.58 cm  IVC diam: 1.42 cm TAPSE (M-mode): 2.4 cm LEFT ATRIUM             Index       RIGHT ATRIUM           Index LA diam:        3.30 cm 1.91 cm/m  RA Area:     12.00 cm LA Vol (A2C):   41.1 ml 23.82 ml/m RA Volume:   23.40 ml  13.56 ml/m LA Vol (A4C):   34.8 ml 20.17 ml/m LA Biplane Vol: 40.1 ml 23.24 ml/m  AORTIC VALVE LVOT Vmax:   92.15 cm/s LVOT Vmean:  59.400 cm/s LVOT VTI:    0.163 m  AORTA Ao Root diam: 3.50 cm Ao Asc diam:  3.60 cm MITRAL VALVE                TRICUSPID VALVE MV Area (PHT): 2.76 cm    TR Peak grad:   21.5 mmHg MV Decel Time: 275 msec    TR Vmax:        232.00 cm/s MV E velocity: 60.60 cm/s MV A velocity: 85.70 cm/s  SHUNTS MV E/A ratio:  0.71        Systemic VTI:  0.16 m                            Systemic Diam: 2.00 cm Jodelle Red MD Electronically signed by Jodelle Red MD Signature Date/Time: 09/14/2020/8:03:57 AM    Final    VAS [place] LOWER EXTREMITY VENOUS (DVT)  Result Date: 09/13/2020  Lower Venous DVTStudy Indications: Elevated Ddimer.  Risk Factors: COVID 19 positive. Comparison Study: No prior studies. Performing  Technologist: Chanda Busing RVT  Examination Guidelines: A complete evaluation includes B-mode imaging, spectral Doppler, color Doppler, and power Doppler as needed of all accessible portions of each vessel. Bilateral testing is considered an integral part of a complete examination. Limited examinations for reoccurring indications may be performed as noted. The reflux portion of the exam is performed with the patient in reverse Trendelenburg.  +---------+---------------+---------+-----------+----------+--------------+ RIGHT    CompressibilityPhasicitySpontaneityPropertiesThrombus Aging +---------+---------------+---------+-----------+----------+--------------+ CFV      Full           Yes      Yes                                 +---------+---------------+---------+-----------+----------+--------------+ SFJ      Full                                                        +---------+---------------+---------+-----------+----------+--------------+ FV Prox  Full                                                        +---------+---------------+---------+-----------+----------+--------------+ FV Mid   Full                                                        +---------+---------------+---------+-----------+----------+--------------+ FV DistalFull                                                         +---------+---------------+---------+-----------+----------+--------------+ PFV      Full                                                        +---------+---------------+---------+-----------+----------+--------------+ POP      Full           Yes      Yes                                 +---------+---------------+---------+-----------+----------+--------------+ PTV      Full                                                        +---------+---------------+---------+-----------+----------+--------------+ PERO     Full                                                        +---------+---------------+---------+-----------+----------+--------------+   +---------+---------------+---------+-----------+----------+--------------+  LEFT     CompressibilityPhasicitySpontaneityPropertiesThrombus Aging +---------+---------------+---------+-----------+----------+--------------+ CFV      Full           Yes      Yes                                 +---------+---------------+---------+-----------+----------+--------------+ SFJ      Full                                                        +---------+---------------+---------+-----------+----------+--------------+ FV Prox  Full                                                        +---------+---------------+---------+-----------+----------+--------------+ FV Mid   Full                                                        +---------+---------------+---------+-----------+----------+--------------+ FV DistalFull                                                        +---------+---------------+---------+-----------+----------+--------------+ PFV      Full                                                        +---------+---------------+---------+-----------+----------+--------------+ POP      Full           Yes      Yes                                  +---------+---------------+---------+-----------+----------+--------------+ PTV      Full                                                        +---------+---------------+---------+-----------+----------+--------------+ PERO     Full                                                        +---------+---------------+---------+-----------+----------+--------------+     Summary: RIGHT: - There is no evidence of deep vein thrombosis in the lower extremity.  - No cystic structure found in the popliteal fossa.  LEFT: - There is no evidence of deep vein thrombosis in the lower extremity.  - No  cystic structure found in the popliteal fossa.  *See table(s) above for measurements and observations. Electronically signed by Coral ElseVance Brabham MD on 09/13/2020 at 8:53:51 PM.    Final

## 2020-09-19 NOTE — NC FL2 (Signed)
[place] MEDICAID FL2 LEVEL OF CARE SCREENING TOOL     IDENTIFICATION  Patient Name: Larry Olsen Birthdate: 1941/09/16 Sex: male Admission Date (Current Location): 09/12/2020  [facility] and [place] Number:  Producer, television/film/video and Address:  [facility] [place], [address], [place], [place] [address]      Provider Number: [id number]  Attending Physician Name and Address:  Maretta Bees, MD  Relative Name and Phone Number:  Maureen Ralphs daughter, [phone]    Current Level of Care: Hospital Recommended Level of Care: Skilled Nursing Facility Prior Approval Number:    Date Approved/Denied:   PASRR Number: [id number]  Discharge Plan: SNF    Current Diagnoses: Patient Active Problem List   Diagnosis Date Noted  . Pneumonia due to COVID-19 virus 09/12/2020  . Hyponatremia 09/12/2020  . ARF (acute renal failure) (HCC) 09/12/2020  . Demand ischemia (HCC) 09/12/2020    Orientation RESPIRATION BLADDER Height & Weight     Self, Time, Situation, Place  O2 (Nasal cannula) Incontinent, External catheter Weight: 145 lb (65.8 kg) Height:  5\' 5"  (165.1 cm)  BEHAVIORAL SYMPTOMS/MOOD NEUROLOGICAL BOWEL NUTRITION STATUS      Continent Diet (Please see dc summary)  AMBULATORY STATUS COMMUNICATION OF NEEDS Skin   Limited Assist Verbally Normal                       Personal Care Assistance Level of Assistance  Bathing, Feeding, Dressing Bathing Assistance: Limited assistance Feeding assistance: Independent Dressing Assistance: Limited assistance     Functional Limitations Info  Sight, Hearing, Speech Sight Info: Adequate Hearing Info: Adequate Speech Info: Adequate    SPECIAL CARE FACTORS FREQUENCY  PT (By licensed PT), OT (By licensed OT)     PT Frequency: 5x/week OT Frequency: 5x/week            Contractures Contractures Info: Not present    Additional Factors Info  Code Status, Allergies, Isolation Precautions Code Status  Info: DNR Allergies Info: NKA     Isolation Precautions Info: COVID + 09/12/20     Current Medications (09/19/2020):  This is the current hospital active medication list Current Facility-Administered Medications  Medication Dose Route Frequency Provider Last Rate Last Admin  . acetaminophen (TYLENOL) tablet 650 mg  650 mg Oral Q6H PRN 13/12/2019, MD      . albuterol (VENTOLIN HFA) 108 (90 Base) MCG/ACT inhaler 2 puff  2 puff Inhalation Q4H PRN Synetta Fail, MD      . amLODipine (NORVASC) tablet 2.5 mg  2.5 mg Oral Daily Maretta Bees, MD   2.5 mg at 09/19/20 0850  . atorvastatin (LIPITOR) tablet 10 mg  10 mg Oral Daily 13/02/21, MD   10 mg at 09/19/20 0850  . chlorpheniramine-HYDROcodone (TUSSIONEX) 10-8 MG/5ML suspension 5 mL  5 mL Oral Q12H PRN Ghimire, 13/02/21, MD      . clonazePAM Werner Lean) tablet 0.5 mg  0.5 mg Oral TID PRN Scarlette Calico, MD      . enoxaparin (LOVENOX) injection 40 mg  40 mg Subcutaneous Q12H Maretta Bees, MD   40 mg at 09/19/20 0850  . guaiFENesin-dextromethorphan (ROBITUSSIN DM) 100-10 MG/5ML syrup 10 mL  10 mL Oral Q4H PRN 13/02/21, MD      . methylPREDNISolone sodium succinate (SOLU-MEDROL) 40 mg/mL injection 40 mg  40 mg Intravenous Q12H Synetta Fail, MD   40 mg at 09/19/20 0850  . ondansetron (  ZOFRAN) injection 4 mg  4 mg Intravenous Q6H PRN Ghimire, Werner Lean, MD      . oxymetazoline (AFRIN) 0.05 % nasal spray 3 spray  3 spray Each Nare BID PRN Maretta Bees, MD      . pantoprazole (PROTONIX) EC tablet 40 mg  40 mg Oral Q1200 Maretta Bees, MD   40 mg at 09/19/20 1316  . polyethylene glycol (MIRALAX / GLYCOLAX) packet 17 g  17 g Oral Daily PRN Synetta Fail, MD      . sodium chloride (OCEAN) 0.65 % nasal spray 1 spray  1 spray Each Nare PRN Maretta Bees, MD   1 spray at 09/15/20 2034  . sodium chloride flush (NS) 0.9 % injection 3 mL  3 mL Intravenous Q12H Synetta Fail, MD    3 mL at 09/19/20 3300     Discharge Medications: Please see discharge summary for a list of discharge medications.  Relevant Imaging Results:  Relevant Lab Results:   Additional Information SSN: [id number] [place], [place]

## 2020-09-19 NOTE — Progress Notes (Signed)
Daily Progress Note   Patient Name: Larry Olsen       Date: 09/19/2020 DOB: 12-15-1940  Age: 79 y.o. MRN#: [id number] Attending Physician: Maretta Bees, MD Primary Care Physician: Patient, No Pcp Per Admit Date: 09/12/2020  Reason for Consultation/Follow-up: Establishing goals of care  Subjective: I saw and examined Mr. Schneck today.  He was sitting in bedside chair in no acute distress.  He is frustrated by how weak he is feeling and the fact that he is so short of breath so quickly.  States that yesterday he was ready to transition to comfort care but, "now they tell me I am not dying."  We discussed plan to continue with current interventions and reassess his situation on a day-to-day basis.  He reports he, "really does not have any other choice."  We discussed his frustration with becoming so acutely ill so quickly and how difficult it is for him to not be as independent as he is used to being at home.  Reports seeing his family and his dog and the things are most important to him when he is looking forward to.  He asked me to call and update his daughter, Maureen Ralphs.  I did call was able to reach [place].  We reviewed his clinical course over the last 24 hours and significant improvement in his hypoxemia.  Discussed plan for continuation of current interventions with eventual discharge to skilled facility for rehab.  Maureen Ralphs reports understanding that her father is frustrated and states that he is always been a very independent person who is used to being able to do what he wants when he wants to do it.  Maureen Ralphs reports that he was in a better state of mind earlier today when she spoke with him.  She thinks that he is tired in the afternoon and that is why he becomes more emotionally  overwhelmed by his lack of physical strength.  Length of Stay: 7  Current Medications: Scheduled Meds:  . amLODipine  2.5 mg Oral Daily  . atorvastatin  10 mg Oral Daily  . enoxaparin (LOVENOX) injection  40 mg Subcutaneous Q12H  . methylPREDNISolone (SOLU-MEDROL) injection  40 mg Intravenous Q12H  . pantoprazole  40 mg Oral Q1200  . sodium chloride flush  3 mL Intravenous Q12H    Continuous Infusions:  PRN Meds: acetaminophen, albuterol, chlorpheniramine-HYDROcodone, clonazePAM, guaiFENesin-dextromethorphan, ondansetron (ZOFRAN) IV, oxymetazoline, polyethylene glycol, sodium chloride  Physical Exam        General: Alert, awake, in no acute distress.  HEENT: No bruits, no goiter, no JVD Heart: Regular rate and rhythm. No murmur appreciated. Lungs: Fair air movement, clear Abdomen: Soft, nontender, nondistended, positive bowel sounds.  Ext: No significant edema Skin: Warm and dry Neuro: Grossly intact, nonfocal.    Vital Signs: BP 135/76 (BP Location: Left Arm)   Pulse 100   Temp 98.1 F (36.7 C) (Oral)   Resp 20   Ht 5\' 5"  (1.651 m)   Wt 65.8 kg   SpO2 100%   BMI 24.13 kg/m  SpO2: SpO2: 100 % O2 Device: O2 Device: High Flow Nasal Cannula O2 Flow Rate: O2 Flow Rate (L/min): 8 L/min  Intake/output summary:   Intake/Output Summary (Last 24 hours) at 09/19/2020 1853 Last data filed at 09/18/2020 2300 Gross per 24 hour  Intake 240 ml  Output 1800 ml  Net -1560 ml   LBM: Last BM Date: 09/18/20 Baseline Weight: Weight: 65.8 kg Most recent weight: Weight: 65.8 kg       Palliative Assessment/Data:      Patient Active Problem List   Diagnosis Date Noted  . Pneumonia due to COVID-19 virus 09/12/2020  . Hyponatremia 09/12/2020  . ARF (acute renal failure) (HCC) 09/12/2020  . Demand ischemia [facility]) 09/12/2020    Palliative Care Assessment & Plan   Patient Profile:  Recommendations/Plan:  DNR/DNI  Continue current interventions.  He appears to be  clinically improved from yesterday.  Discussed plan for continuation of current interventions and likely plan moving forward will be to skilled facility for rehab.  Mr. Hartwig is frustrated by his overall weakness.  He is used to being a very independent man.  He stated that he feels, "helpless" in his current state.  Emotional support provided.  Code Status:    Code Status Orders  (From admission, onward)         Start     Ordered   09/13/20 1226  Do not attempt resuscitation (DNR)  Continuous       Question Answer Comment  In the event of cardiac or respiratory ARREST Do not call a "code blue"   In the event of cardiac or respiratory ARREST Do not perform Intubation, CPR, defibrillation or ACLS   In the event of cardiac or respiratory ARREST Use medication by any route, position, wound care, and other measures to relive pain and suffering. May use oxygen, suction and manual treatment of airway obstruction as needed for comfort.      09/13/20 1226        Code Status History    Date Active Date Inactive Code Status Order ID Comments User Context   09/12/2020 2019 09/13/2020 1226 Full Code 09/15/2020  [id number], MD ED   Advance Care Planning Activity       Prognosis:   Guarded  Discharge Planning:  Skilled Nursing Facility for rehab with Palliative care service follow-up most likely  Care plan was discussed with patient, daughter  Thank you for allowing the Palliative Medicine Team to assist in the care of this patient.   Time In: 1820 Time Out: 1850 Total Time 30 Prolonged Time Billed No      Greater than 50%  of this time was spent counseling and coordinating care related to the above assessment and plan.  1821, MD  Please contact Palliative  Medicine Team phone at [phone] for questions and concerns.

## 2020-09-19 NOTE — Evaluation (Signed)
Occupational Therapy Evaluation Patient Details Name: Larry Olsen MRN: [id number] DOB: 10/08/1941 Today's Date: 09/19/2020    History of Present Illness 79 y.o. male with PMHx of HTN, HLD-known Covid 19+ approximately 2 weeks prior to this hospital stay-presenting with severe shortness of breath-found to have severe acute hypoxemic respiratory failure requiring 15 L of HFNC in the setting of COVID-19 pneumonia. CTA no PE, LE dopplers no DVT. Patient requesting comfort care as of 09/18/20   Clinical Impression   PTA, pt was living alone and was independent with ADLs and IADLs. Pt currently requiring Min A for UB ADLs, Max A for LB ADLs, and Mod A for functional transfers. Pt presenting with poor balance, strength, and activity tolerance. Pt also presenting increased anxiety, elevated RR into 40-50s, and difficulty controlling breathing; with Max cues, pt able to lower RR to 26. Pt agreeable to sit<>stand to place blue air cushion at seat. Pt would benefit from further acute OT to facilitate safe dc. Recommend dc to SNF for further OT to optimize safety, independence with ADLs, and return to PLOF.     Follow Up Recommendations  SNF;Supervision/Assistance - 24 hour (Possibly HHOT with 24/7 support)    Equipment Recommendations  3 in 1 bedside commode;Tub/shower seat    Recommendations for Other Services PT consult     Precautions / Restrictions Precautions Precautions: Fall      Mobility Bed Mobility               General bed mobility comments: up in recliner    Transfers Overall transfer level: Needs assistance Equipment used: 1 person hand held assist Transfers: Sit to/from Stand Sit to Stand: Mod assist         General transfer comment: assist to initiate upwards transition, steadying assist once standing    Balance Overall balance assessment: Needs assistance Sitting-balance support: No upper extremity supported;Feet supported Sitting balance-Leahy Scale:  Fair     Standing balance support: Single extremity supported Standing balance-Leahy Scale: Poor                             ADL either performed or assessed with clinical judgement   ADL Overall ADL's : Needs assistance/impaired Eating/Feeding: Supervision/ safety;Set up;Sitting   Grooming: Wash/dry face;Set up;Supervision/safety;Sitting   Upper Body Bathing: Minimal assistance;Sitting   Lower Body Bathing: Maximal assistance;Sit to/from stand   Upper Body Dressing : Minimal assistance;Sitting   Lower Body Dressing: Maximal assistance;Sit to/from stand   Toilet Transfer: Moderate assistance;+2 for physical assistance (simulated at recliner; sit<>Stand) Toilet Transfer Details (indicate cue type and reason): Mod A for power up into standing and then maintain standing position         Functional mobility during ADLs: Moderate assistance;+2 for safety/equipment (sit<>stand) General ADL Comments: Pt presenting with decreased balance, strength, and activity tolerance. Pt with increased anxiety and frustration. Can be self limiting.      Vision Baseline Vision/History: Wears glasses Wears Glasses: At all times Patient Visual Report: No change from baseline       Perception     Praxis      Pertinent Vitals/Pain Pain Assessment: Faces Faces Pain Scale: Hurts little more Pain Location: back Pain Descriptors / Indicators: Discomfort Pain Intervention(s): Limited activity within patient's tolerance;Monitored during session;Repositioned     Hand Dominance Right   Extremity/Trunk Assessment Upper Extremity Assessment Upper Extremity Assessment: Generalized weakness   Lower Extremity Assessment Lower Extremity Assessment: Defer to PT evaluation  Cervical / Trunk Assessment Cervical / Trunk Assessment: Normal   Communication Communication Communication: Other (comment) (dyspnea limits intelligibility)   Cognition Arousal/Alertness: Awake/alert Behavior  During Therapy: Anxious;Impulsive Overall Cognitive Status: Within Functional Limits for tasks assessed                                 General Comments: irritated, not feeling well; mood improved with playing Kathie Rhodes via computer/YouTubeMusic   General Comments  at rest on 6L sats 95% HR 100 RR 30s; with talking or coughing, RR increases to 40s-50, sats at lowest 89% and HRmax 123    Exercises Exercises: Other exercises Other Exercises Other Exercises: Educated on pursed lip breathing to try to slow RR. Patient with poor ability to replicate. Attempted use of "easy listening music" to reduce RR and this only aggravated pt as he only listens to rock and roll (specifically requested Kathie Rhodes). Educated on visual imagery to induce relaxation and decr RR and pt reported he can't do this.    Shoulder Instructions      Home Living Family/patient expects to be discharged to:: Private residence Living Arrangements: Alone Available Help at Discharge: Family;Available 24 hours/day (daughter moved in when he was hospitalized (take care of dog) Type of Home: House       Home Layout: Two level;Bed/bath upstairs;Full bath on main level Alternate Level Stairs-Number of Steps: flight Alternate Level Stairs-Rails: Right Bathroom Shower/Tub: Tub/shower unit         Home Equipment: Walker - 2 wheels          Prior Functioning/Environment Level of Independence: Independent        Comments: prior to illness, did everything himself (driving, groceries)        OT Problem List: Decreased strength;Decreased range of motion;Decreased activity tolerance;Impaired balance (sitting and/or standing);Decreased knowledge of use of DME or AE;Decreased knowledge of precautions;Pain      OT Treatment/Interventions: Self-care/ADL training;Therapeutic exercise;Energy conservation;DME and/or AE instruction;Therapeutic activities;Patient/family education    OT Goals(Current goals can be  found in the care plan section) Acute Rehab OT Goals Patient Stated Goal: did not state OT Goal Formulation: With patient Time For Goal Achievement: 10/03/20 Potential to Achieve Goals: Good  OT Frequency: Min 2X/week   Barriers to D/C:            Co-evaluation PT/OT/SLP Co-Evaluation/Treatment: Yes Reason for Co-Treatment: For patient/therapist safety;To address functional/ADL transfers (Poor activity tolerance) PT goals addressed during session: Mobility/safety with mobility;Balance OT goals addressed during session: ADL's and self-care      AM-PAC OT "6 Clicks" Daily Activity     Outcome Measure Help from another person eating meals?: A Little Help from another person taking care of personal grooming?: A Little Help from another person toileting, which includes using toliet, bedpan, or urinal?: A Lot Help from another person bathing (including washing, rinsing, drying)?: A Lot Help from another person to put on and taking off regular upper body clothing?: A Little Help from another person to put on and taking off regular lower body clothing?: A Lot 6 Click Score: 15   End of Session Equipment Utilized During Treatment: Oxygen (6L) Nurse Communication: Mobility status  Activity Tolerance: Patient limited by fatigue Patient left: in chair;with call bell/phone within reach  OT Visit Diagnosis: Unsteadiness on feet (R26.81);Other abnormalities of gait and mobility (R26.89);Muscle weakness (generalized) (M62.81);Pain Pain - part of body:  (Back)  Time: 5093-2671 OT Time Calculation (min): 43 min Charges:  OT General Charges $OT Visit: 1 Visit OT Evaluation $OT Eval Moderate Complexity: 1 Mod  Faith Branan MSOT, OTR/L Acute Rehab Pager: [phone] Office: [phone]  Theodoro Grist Deiona Hooper 09/19/2020, 2:01 PM

## 2020-09-19 NOTE — TOC Initial Note (Signed)
Transition of Care [facility]) - Initial/Assessment Note    Patient Details  Name: Larry Olsen MRN: [id number] Date of Birth: [birth year]  [facility]) CM/SW Contact:    Mearl Latin, LCSW Phone Number: 09/19/2020, 2:56 PM  Clinical Narrative:                 CSW received consult for possible SNF placement at time of discharge. CSW spoke with patient regarding PT recommendation of SNF placement at time of discharge. Patient reported that he does not feel well and cannot talk at this time. He asked that further communication go through his daughter, Larry Olsen. CSW spoke with Larry Olsen. She reported that she will leave it up to the patient on if he wants to try SNF or come home; she requested CSW contact patient tomorrow to see if he feels more like talking. CSW discussed insurance authorization process and notified her that [facility] the only available COVID facility in this area.     Barriers to Discharge: Continued Medical Work up, English as a second language teacher, SNF Pending bed offer   Patient Goals and CMS Choice Patient states their goals for this hospitalization and ongoing recovery are:: Rehab CMS Medicare.gov Compare Post Acute Care list provided to:: Patient Choice offered to / list presented to : Patient, Adult Children  Expected Discharge Plan and Services   In-house Referral: Clinical Social Work     Living arrangements for the past 2 months: Single Family Home                                      Prior Living Arrangements/Services Living arrangements for the past 2 months: Single Family Home Lives with:: Self, Adult Children Patient language and need for interpreter reviewed:: Yes Do you feel safe going back to the place where you live?: Yes      Need for Family Participation in Patient Care: Yes (Comment) Care giver support system in place?: Yes (comment)   Criminal Activity/Legal Involvement Pertinent to Current Situation/Hospitalization: No - Comment as  needed  Activities of Daily Living Home Assistive Devices/Equipment: None ADL Screening (condition at time of admission) Patient's cognitive ability adequate to safely complete daily activities?: Yes Is the patient deaf or have difficulty hearing?: No Does the patient have difficulty seeing, even when wearing glasses/contacts?: No Does the patient have difficulty concentrating, remembering, or making decisions?: No Patient able to express need for assistance with ADLs?: Yes Does the patient have difficulty dressing or bathing?: No Independently performs ADLs?: No Communication: Independent Dressing (OT): Independent Grooming: Independent Feeding: Independent Bathing: Independent Toileting: Independent In/Out Bed: Independent Walks in Home: Independent Does the patient have difficulty walking or climbing stairs?: No Weakness of Legs: None Weakness of Arms/Hands: Both  Permission Sought/Granted Permission sought to share information with : Facility Medical sales representative, Family Supports Permission granted to share information with : Yes, Verbal Permission Granted  Share Information with NAME: Larry Olsen  Permission granted to share info w AGENCY: SNFs  Permission granted to share info w Relationship: Daughter  Permission granted to share info w Contact Information: [phone]  Emotional Assessment   Attitude/Demeanor/Rapport: Guarded Affect (typically observed): Guarded, Frustrated Orientation: : Oriented to Self, Oriented to Place, Oriented to  Time, Oriented to Situation Alcohol / Substance Use: Not Applicable Psych Involvement: No (comment)  Admission diagnosis:  Acute respiratory failure with hypoxia (HCC) [J96.01] Multifocal pneumonia [J18.9] Pneumonia due to COVID-19 virus [U07.1,  J12.82] COVID-19 [U07.1] Patient Active Problem List   Diagnosis Date Noted  . Pneumonia due to COVID-19 virus 09/12/2020  . Hyponatremia 09/12/2020  . ARF (acute renal failure) (HCC)  09/12/2020  . Demand ischemia (HCC) 09/12/2020   PCP:  Patient, No Pcp Per Pharmacy:   [id number] - [place], [place] - [address] [place] [address] Phone: [phone] Fax: [phone]     Social Determinants of Health (SDOH) Interventions    Readmission Risk Interventions No flowsheet data found.

## 2020-09-19 NOTE — Evaluation (Signed)
Physical Therapy Evaluation Patient Details Name: Larry Olsen MRN: [id number] DOB: 11/10/1941 Today's Date: 09/19/2020   History of Present Illness  79 y.o. male with PMHx of HTN, HLD-known Covid 19+ approximately 2 weeks prior to this hospital stay-presenting with severe shortness of breath-found to have severe acute hypoxemic respiratory failure requiring 15 L of HFNC in the setting of COVID-19 pneumonia. CTA no PE, LE dopplers no DVT. Patient requesting comfort care as of 09/18/20  Clinical Impression   Pt admitted with above diagnosis. Patient was living alone and completely independent PTA. He has medically been improving, however began to discuss moving to comfort care with MD/Palliative care yesterday. MD discussed trying therapies and pt agreed. Today he remained anxious throughout session (with most of session pt remaining seated in recliner). He stood x 1 with moderate assist to allow placement of blue air cushion in the seat of his chair. RR typically in low 40s with highest 50 bpm. Patient unale to utilize breathing techniques as educated.  Pt currently with functional limitations due to the deficits listed below (see PT Problem List). Pt will benefit from skilled PT to increase their independence and safety with mobility to allow discharge to the venue listed below.       Follow Up Recommendations SNF;Supervision/Assistance - 24 hour (consider HH if family can provide 24/7 assist)    Equipment Recommendations  Rolling walker with 5" wheels    Recommendations for Other Services       Precautions / Restrictions Precautions Precautions: Fall      Mobility  Bed Mobility               General bed mobility comments: up in recliner    Transfers Overall transfer level: Needs assistance Equipment used: 1 person hand held assist Transfers: Sit to/from Stand Sit to Stand: Mod assist         General transfer comment: assist to initiate upwards transition, steadying  assist once standing  Ambulation/Gait             General Gait Details: unable due to coughing, RR 50s  Stairs            Wheelchair Mobility    Modified Rankin (Stroke Patients Only)       Balance Overall balance assessment: Needs assistance Sitting-balance support: No upper extremity supported;Feet supported Sitting balance-Leahy Scale: Fair     Standing balance support: Single extremity supported Standing balance-Leahy Scale: Poor                               Pertinent Vitals/Pain Pain Assessment: Faces Faces Pain Scale: Hurts little more Pain Location: back Pain Descriptors / Indicators: Discomfort Pain Intervention(s): Limited activity within patient's tolerance;Monitored during session;Repositioned    Home Living Family/patient expects to be discharged to:: Private residence Living Arrangements: Alone Available Help at Discharge: Family;Available 24 hours/day (daughter moved in when he was hospitalized (take care of dog) Type of Home: House       Home Layout: Two level;Bed/bath upstairs;Full bath on main level Home Equipment: Walker - 2 wheels      Prior Function Level of Independence: Independent         Comments: prior to illness, did everything himself (driving, groceries)     Hand Dominance        Extremity/Trunk Assessment   Upper Extremity Assessment Upper Extremity Assessment: Defer to OT evaluation    Lower Extremity Assessment Lower Extremity  Assessment: Generalized weakness    Cervical / Trunk Assessment Cervical / Trunk Assessment: Normal  Communication   Communication: Other (comment) (dyspnea limits intelligibility)  Cognition Arousal/Alertness: Awake/alert Behavior During Therapy: Anxious;Impulsive Overall Cognitive Status: Within Functional Limits for tasks assessed                                 General Comments: irritated, not feeling well; mood improved with playing Kathie Rhodes via  computer/YouTubeMusic      General Comments General comments (skin integrity, edema, etc.): SpO2 90s on 6L    Exercises Other Exercises Other Exercises: Educated on pursed lip breathing to try to slow RR. Patient with poor ability to replicate. Attempted use of "easy listening music" to reduce RR and this only aggravated pt as he only listens to rock and roll (specifically requested Kathie Rhodes). Educated on visual imagery to induce relaxation and decr RR and pt reported he can't do this.    Assessment/Plan    PT Assessment Patient needs continued PT services  PT Problem List Decreased strength;Decreased activity tolerance;Decreased balance;Decreased mobility;Decreased knowledge of use of DME;Cardiopulmonary status limiting activity       PT Treatment Interventions DME instruction;Gait training;Functional mobility training;Therapeutic activities;Therapeutic exercise;Balance training;Patient/family education    PT Goals (Current goals can be found in the Care Plan section)  Acute Rehab PT Goals Patient Stated Goal: did not state PT Goal Formulation: With patient Time For Goal Achievement: 10/03/20 Potential to Achieve Goals: Fair    Frequency Min 3X/week   Barriers to discharge        Co-evaluation PT/OT/SLP Co-Evaluation/Treatment: Yes Reason for Co-Treatment: Complexity of the patient's impairments (multi-system involvement);Other (comment) (due to pt's limited endurance) PT goals addressed during session: Mobility/safety with mobility;Balance         AM-PAC PT "6 Clicks" Mobility  Outcome Measure Help needed turning from your back to your side while in a flat bed without using bedrails?: A Little Help needed moving from lying on your back to sitting on the side of a flat bed without using bedrails?: A Little Help needed moving to and from a bed to a chair (including a wheelchair)?: A Lot Help needed standing up from a chair using your arms (e.g., wheelchair or bedside  chair)?: A Lot Help needed to walk in hospital room?: Total Help needed climbing 3-5 steps with a railing? : Total 6 Click Score: 12    End of Session Equipment Utilized During Treatment: Oxygen Activity Tolerance: Treatment limited secondary to medical complications (Comment) (incr RR and HR; incr anxiety) Patient left: in chair;with call bell/phone within reach   PT Visit Diagnosis: Muscle weakness (generalized) (M62.81);Difficulty in walking, not elsewhere classified (R26.2)    Time: 6578-4696 PT Time Calculation (min) (ACUTE ONLY): 43 min   Charges:   PT Evaluation $PT Eval Moderate Complexity: 1 Mod PT Treatments $Self Care/Home Management: 8-22         Jerolyn Center, PT Pager [phone]   Zena Amos 09/19/2020, 1:32 PM

## 2020-09-20 DIAGNOSIS — R0602 Shortness of breath: Secondary | ICD-10-CM | POA: Diagnosis not present

## 2020-09-20 DIAGNOSIS — Z7189 Other specified counseling: Secondary | ICD-10-CM | POA: Diagnosis not present

## 2020-09-20 DIAGNOSIS — Z515 Encounter for palliative care: Secondary | ICD-10-CM | POA: Diagnosis not present

## 2020-09-20 DIAGNOSIS — U071 COVID-19: Secondary | ICD-10-CM | POA: Diagnosis not present

## 2020-09-20 DIAGNOSIS — J9601 Acute respiratory failure with hypoxia: Secondary | ICD-10-CM | POA: Diagnosis not present

## 2020-09-20 LAB — COMPREHENSIVE METABOLIC PANEL
ALT: 54 U/L — ABNORMAL HIGH (ref 0–44)
AST: 42 U/L — ABNORMAL HIGH (ref 15–41)
Albumin: 2.5 g/dL — ABNORMAL LOW (ref 3.5–5.0)
Alkaline Phosphatase: 102 U/L (ref 38–126)
Anion gap: 10 (ref 5–15)
BUN: 47 mg/dL — ABNORMAL HIGH (ref 8–23)
CO2: 25 mmol/L (ref 22–32)
Calcium: 8.4 mg/dL — ABNORMAL LOW (ref 8.9–10.3)
Chloride: 98 mmol/L (ref 98–111)
Creatinine, Ser: 1.51 mg/dL — ABNORMAL HIGH (ref 0.61–1.24)
GFR, Estimated: 46 mL/min — ABNORMAL LOW (ref >60)
Glucose, Bld: 155 mg/dL — ABNORMAL HIGH (ref 70–99)
Potassium: 4.6 mmol/L (ref 3.5–5.1)
Sodium: 133 mmol/L — ABNORMAL LOW (ref 135–145)
Total Bilirubin: 1.1 mg/dL (ref 0.3–1.2)
Total Protein: 5.1 g/dL — ABNORMAL LOW (ref 6.5–8.1)

## 2020-09-20 LAB — CBC
HCT: 38.9 % — ABNORMAL LOW (ref 39.0–52.0)
Hemoglobin: 13.3 g/dL (ref 13.0–17.0)
MCH: 31.8 pg (ref 26.0–34.0)
MCHC: 34.2 g/dL (ref 30.0–36.0)
MCV: 93.1 fL (ref 80.0–100.0)
Platelets: 277 10*3/uL (ref 150–400)
RBC: 4.18 MIL/uL — ABNORMAL LOW (ref 4.22–5.81)
RDW: 12.7 % (ref 11.5–15.5)
WBC: 27.5 10*3/uL — ABNORMAL HIGH (ref 4.0–10.5)
nRBC: 0 % (ref 0.0–0.2)

## 2020-09-20 LAB — GLUCOSE, CAPILLARY
Glucose-Capillary: 121 mg/dL — ABNORMAL HIGH (ref 70–99)
Glucose-Capillary: 110 mg/dL — ABNORMAL HIGH (ref 70–99)
Glucose-Capillary: 175 mg/dL — ABNORMAL HIGH (ref 70–99)

## 2020-09-20 LAB — D-DIMER, QUANTITATIVE: D-Dimer, Quant: 0.65 ug/mL-FEU — ABNORMAL HIGH (ref 0.00–0.50)

## 2020-09-20 LAB — C-REACTIVE PROTEIN: CRP: 0.6 mg/dL (ref <1.0)

## 2020-09-20 MED ORDER — ENSURE ENLIVE PO LIQD
237.0000 mL | Freq: Two times a day (BID) | ORAL | Status: DC
  Administered 2020-09-21 – 2020-09-24 (×7): 237 mL via ORAL

## 2020-09-20 MED ORDER — BIOTENE DRY MOUTH MT LIQD: 15.0000 mL | OROMUCOSAL | Status: DC | PRN

## 2020-09-20 NOTE — Plan of Care (Signed)
  Problem: Education: Goal: Knowledge of risk factors and measures for prevention of condition will improve Outcome: Progressing   Problem: Coping: Goal: Psychosocial and spiritual needs will be supported Outcome: Progressing   Problem: Respiratory: Goal: Will maintain a patent airway Outcome: Progressing Goal: Complications related to the disease process, condition or treatment will be avoided or minimized Outcome: Progressing   Problem: Clinical Measurements: Goal: Ability to maintain clinical measurements within normal limits will improve Outcome: Progressing Goal: Respiratory complications will improve Outcome: Progressing   Problem: Nutrition: Goal: Adequate nutrition will be maintained Outcome: Progressing   Problem: Elimination: Goal: Will not experience complications related to bowel motility Outcome: Progressing Goal: Will not experience complications related to urinary retention Outcome: Progressing   Problem: Safety: Goal: Ability to remain free from injury will improve Outcome: Progressing   Problem: Skin Integrity: Goal: Risk for impaired skin integrity will decrease Outcome: Progressing   Problem: Education: Goal: Knowledge of risk factors and measures for prevention of condition will improve Outcome: Progressing   Problem: Coping: Goal: Psychosocial and spiritual needs will be supported Outcome: Progressing   Problem: Respiratory: Goal: Will maintain a patent airway Outcome: Progressing Goal: Complications related to the disease process, condition or treatment will be avoided or minimized Outcome: Progressing

## 2020-09-20 NOTE — Progress Notes (Signed)
Daily Progress Note   Patient Name: Larry Olsen       Date: 09/20/2020 DOB: 08/16/1941  Age: 79 y.o. MRN#: [id number] Attending Physician: Maretta Bees, MD Primary Care Physician: Patient, No Pcp Per Admit Date: 09/12/2020  Reason for Consultation/Follow-up: Establishing goals of care  Subjective: I saw and examined Larry Olsen today.  He was sitting upright in bed in no distress.  He continues to report frustration with his weakness and shortness of breath, however, he does appear to be in better spirits than yesterday.  We talked again about plan to continue with current interventions and that he likely has a slow recovery process ahead of him.  He seems more open to this today than yesterday and denies having any questions.  Length of Stay: 8  Current Medications: Scheduled Meds:  . amLODipine  2.5 mg Oral Daily  . atorvastatin  10 mg Oral Daily  . enoxaparin (LOVENOX) injection  40 mg Subcutaneous Q12H  . feeding supplement  237 mL Oral BID BM  . methylPREDNISolone (SOLU-MEDROL) injection  40 mg Intravenous Q12H  . pantoprazole  40 mg Oral Q1200  . sodium chloride flush  3 mL Intravenous Q12H    Continuous Infusions:   PRN Meds: acetaminophen, albuterol, chlorpheniramine-HYDROcodone, clonazePAM, guaiFENesin-dextromethorphan, ondansetron (ZOFRAN) IV, oxymetazoline, polyethylene glycol, sodium chloride  Physical Exam        General: Alert, awake, in no acute distress. Sitting up in bed.  Less dyspnic today HEENT: No bruits, no goiter, no JVD Heart: Regular rate and rhythm. No murmur appreciated. Lungs: Fair air movement, clear Abdomen: Soft, nontender, nondistended, positive bowel sounds.  Ext: No significant edema Skin: Warm and dry Neuro: Grossly intact,  nonfocal.    Vital Signs: BP (!) 142/68 (BP Location: Right Arm)   Pulse 92   Temp 97.7 F (36.5 C) (Oral)   Resp 20   Ht 5\' 5"  (1.651 m)   Wt 65.8 kg   SpO2 99%   BMI 24.13 kg/m  SpO2: SpO2: 99 % O2 Device: O2 Device: Nasal Cannula O2 Flow Rate: O2 Flow Rate (L/min): 2 L/min  Intake/output summary:   Intake/Output Summary (Last 24 hours) at 09/20/2020 1814 Last data filed at 09/20/2020 1700 Gross per 24 hour  Intake 1440 ml  Output 2550 ml  Net -1110 ml  LBM: Last BM Date: 09/19/20 Baseline Weight: Weight: 65.8 kg Most recent weight: Weight: 65.8 kg       Palliative Assessment/Data:      Patient Active Problem List   Diagnosis Date Noted  . Pneumonia due to COVID-19 virus 09/12/2020  . Hyponatremia 09/12/2020  . ARF (acute renal failure) (HCC) 09/12/2020  . Demand ischemia [facility]) 09/12/2020    Palliative Care Assessment & Plan   Patient Profile:  Recommendations/Plan:  DNR/DNI  Continue current interventions.  While he remains frustrated by his overall weakness, Mr. Kerstetter does state today that he understands long road to recovery and is open for skilled facility at time of discharge for continued attempts at rehab.  Palliative will continue to follow intermittently.  Please call if there are specific needs with which we can be of assistance in the care of Larry Olsen moving forward.  Code Status:    Code Status Orders  (From admission, onward)         Start     Ordered   09/13/20 1226  Do not attempt resuscitation (DNR)  Continuous       Question Answer Comment  In the event of cardiac or respiratory ARREST Do not call a "code blue"   In the event of cardiac or respiratory ARREST Do not perform Intubation, CPR, defibrillation or ACLS   In the event of cardiac or respiratory ARREST Use medication by any route, position, wound care, and other measures to relive pain and suffering. May use oxygen, suction and manual treatment of airway obstruction  as needed for comfort.      09/13/20 1226        Code Status History    Date Active Date Inactive Code Status Order ID Comments User Context   09/12/2020 2019 09/13/2020 1226 Full Code [id number]  Synetta Fail, MD ED   Advance Care Planning Activity       Prognosis:   Guarded  Discharge Planning:  Skilled Nursing Facility for rehab with Palliative care service follow-up most likely  Care plan was discussed with patient  Thank you for allowing the Palliative Medicine Team to assist in the care of this patient.   Time In: 1300 Time Out: 1320 Total Time 20 Prolonged Time Billed No   Greater than 50%  of this time was spent counseling and coordinating care related to the above assessment and plan.  Romie Minus, MD  Please contact Palliative Medicine Team phone at [phone] for questions and concerns.

## 2020-09-20 NOTE — Progress Notes (Signed)
Nutrition Follow-up  DOCUMENTATION CODES:   Not applicable  INTERVENTION:  Provide Ensure Enlive po BID, each supplement provides 350 kcal and 20 grams of protein  Encourage adequate PO intake.   NUTRITION DIAGNOSIS:   Increased nutrient needs related to acute illness (COVID-19 pneumonia) as evidenced by estimated needs; ongoing  GOAL:   Patient will meet greater than or equal to 90% of their needs; progressing  MONITOR:   PO intake, Supplement acceptance, Labs, Weight trends  REASON FOR ASSESSMENT:   Malnutrition Screening Tool    ASSESSMENT:   79 year old male who presented to the ED on 10/26 with SOB related to COVID-19. PMH of HTN, HLD. Pt admitted with COVID-19 pneumonia.  Hypoxemia has improved. Pt is now on 2 L nasal cannula. Per Palliative, as pt with clinical improvement plans to continue current care. Meal completion has been 50-100% with most recent intake at 100%. RD to order nutritional supplements to aid in caloric and protein needs.   Labs and medications reviewed.   Diet Order:   Diet Order            Diet Heart Room service appropriate? Yes; Fluid consistency: Thin  Diet effective now                 EDUCATION NEEDS:   No education needs have been identified at this time  Skin:  Skin Assessment: Reviewed RN Assessment  Last BM:  11/2  Height:   Ht Readings from Last 1 Encounters:  09/12/20 5\' 5"  (1.651 m)    Weight:   Wt Readings from Last 1 Encounters:  09/12/20 65.8 kg    Ideal Body Weight:  61.8 kg  BMI:  Body mass index is 24.13 kg/m.  Estimated Nutritional Needs:   Kcal:  1800-2000  Protein:  85-100 grams  Fluid:  >/= 1.8 L  09/14/20, MS, RD, LDN RD pager number/after hours weekend pager number on Amion.

## 2020-09-20 NOTE — Progress Notes (Signed)
PROGRESS NOTE                                                                                                                                                                                                             Patient Demographics:    Larry Olsen, is a 79 y.o. male, DOB - 05-Apr-1941, ZOX:[id number]  Outpatient Primary MD for the patient is Patient, No Pcp Per   Admit date - 09/12/2020   LOS - 8  Chief Complaint  Patient presents with  . Shortness of Breath  . Covid Positive       Brief Narrative: Patient is a 79 y.o. male with PMHx of HTN, HLD-known Covid 19+ approximately 2 weeks prior to this hospital stay-presenting with severe shortness of breath-found to have severe acute hypoxemic respiratory failure requiring 15 L of HFNC in the setting of COVID-19 pneumonia.  See below for further details  COVID-19 vaccinated status: Unvaccinated  Significant Events: 10/26>> Admit to [facility] for severe hypoxia due to COVID-19 pneumonia, AKI and hyponatremia.  Significant studies: 10/26>>Chest x-ray: Diffuse interstitial airspace opacities 10/26>> CTA chest: No PE, multifocal pneumonia 10/27>> bilateral lower extremity Doppler: No DVT 10/27>> Echo: EF 55-60%, no wall motion abnormalities, small pericardial effusion  COVID-19 medications: Steroids: 10/26>> Remdesivir: 10/26>> 10/30 Actemra: 10/26 x 1  Antibiotics: None  Microbiology data: 10/26 >>blood culture: No growth  Procedures: None  Consults: None  DVT prophylaxis: Prophylactic Lovenox at intermediate dosing   Subjective:   Less anxious in the past few days-Down to 2 L of oxygen.   Assessment  & Plan :   Acute Hypoxic Resp Failure due to Covid 19 Viral pneumonia: Had severe hypoxemia-was on heated high flow and NRB at one-point-remarkable improvement over the past few days-Down to 2 L of oxygen.  Still very symptomatic with minimal activity  but suspect he will continue to improve with time.  Some mild trace edema in the lower extremities today-but otherwise volume status appears stable.  Given lingering AKI-we will hold off on diuretics for another day.  Reassess tomorrow.   Fever: afebrile O2 requirements:  SpO2: 97 % O2 Flow Rate (L/min): 2 L/min FiO2 (%): (S) 40 %   COVID-19 Labs: Recent Labs    09/18/20 0438 09/19/20 0208 09/20/20 0103  DDIMER 1.53* 0.68* 0.65*  CRP 1.0* 0.7 0.6  Component Value Date/Time   BNP 240.6 (H) 09/12/2020 1342    No results for input(s): PROCALCITON in the last 168 hours.  Lab Results  Component Value Date   SARSCOV2NAA POSITIVE (A) 09/12/2020    Prone/Incentive Spirometry: encouraged patient to lie prone for 3-4 hours at a time for a total of 16 hours a day, and to encourage incentive spirometry use 3-4/hour.  Leukocytosis: Secondary to steroids-no indication for bacterial infection. Follow.  Elevated D-dimer: Secondary to COVID-19-CTA chest negative for PE/lower extremity Dopplers negative for DVT-continue intermediate dosing of Lovenox.  Follow.    AKI: Likely hemodynamically mediated-had resolved on 10/27-but then again worsened-no slowly downtrending with supportive care.  Avoid nephrotoxic agents.  Hyponatremia: Secondary to hypovolemia/dehydration-improved after gentle hydration.  Volume status remains stable.  Transaminitis: Secondary to COVID-19-appears mild-stable for close monitoring/follow-up.  Troponin elevation: Trend is flat-not consistent with ACS-likely demand ischemia due to severe hypoxemia/AKI-echo with preserved EF-no wall motion abnormality.  Doubt further work-up required while inpatient.  HTN: BP stable-continue amlodipine. Continue to hold losartan due to AKI.   HLD: Cautiously continue with statin-watch LFTs.  Palliative care discussion: DNR in place-multiple discussions with the patient and daughter over the past few days.  He does not desire  aggressive care-he actually had indicated that he wanted to be transitioned to comfort measures but given clinical improvement-agrees that we ought to continue with current measures including PT/OT.  GI prophylaxis: PPI  ABG: No results found for: PHART, PCO2ART, PO2ART, HCO3, TCO2, ACIDBASEDEF, O2SAT  Vent Settings: N/A    Condition -stable.  Family Communication  : Daughter Maureen Ralphs [phone]) over the phone on 11/3  Code Status:DNR  Diet :  Diet Order            Diet Heart Room service appropriate? Yes; Fluid consistency: Thin  Diet effective now                  Disposition Plan  :   Status is: Inpatient  Remains inpatient appropriate because:Inpatient level of care appropriate due to severity of illness   Dispo: The patient is from: Home              Anticipated d/c is to: TBD              Anticipated d/c date is: > 3 days              Patient currently is not medically stable to d/c.   Barriers to discharge: Hypoxia requiring O2 supplementation/complete 5 days of IV Remdesivir  Antimicorbials  :    Anti-infectives (From admission, onward)   Start     Dose/Rate Route Frequency Ordered Stop   09/13/20 1000  remdesivir 100 mg in sodium chloride 0.9 % 100 mL IVPB       "Followed by" Linked Group Details   100 mg 200 mL/hr over 30 Minutes Intravenous Daily 09/12/20 1509 09/16/20 0823   09/12/20 1630  remdesivir 200 mg in sodium chloride 0.9% 250 mL IVPB       "Followed by" Linked Group Details   200 mg 580 mL/hr over 30 Minutes Intravenous Once 09/12/20 1509 09/13/20 0915      Inpatient Medications  Scheduled Meds: . amLODipine  2.5 mg Oral Daily  . atorvastatin  10 mg Oral Daily  . enoxaparin (LOVENOX) injection  40 mg Subcutaneous Q12H  . methylPREDNISolone (SOLU-MEDROL) injection  40 mg Intravenous Q12H  . pantoprazole  40 mg Oral Q1200  . sodium chloride flush  3 mL Intravenous Q12H   Continuous Infusions:  PRN Meds:.acetaminophen, albuterol,  chlorpheniramine-HYDROcodone, clonazePAM, guaiFENesin-dextromethorphan, ondansetron (ZOFRAN) IV, oxymetazoline, polyethylene glycol, sodium chloride   Time Spent in minutes  25   See all Orders from today for further details   Jeoffrey Massed M.D on 09/20/2020 at 2:46 PM  To page go to [email or web address] - use universal password  [facility] -  Office  [phone]    Objective:   Vitals:   09/20/20 0800 09/20/20 0933 09/20/20 1000 09/20/20 1207  BP: 118/66 (!) 150/78  127/80  Pulse: 93  95 80  Resp: 20  (!) 24 18  Temp: 98.3 F (36.8 C)   97.8 F (36.6 C)  TempSrc: Oral   Oral  SpO2: 91%  94% 97%  Weight:      Height:        Wt Readings from Last 3 Encounters:  09/12/20 65.8 kg     Intake/Output Summary (Last 24 hours) at 09/20/2020 1446 Last data filed at 09/20/2020 1213 Gross per 24 hour  Intake 960 ml  Output 2250 ml  Net -1290 ml     Physical Exam Gen Exam:Alert awake-not in any distress HEENT:atraumatic, normocephalic Chest: B/L clear to auscultation anteriorly CVS:S1S2 regular Abdomen:soft non tender, non distended Extremities:no edema Neurology: Non focal Skin: no rash  Data Review:    CBC Recent Labs  Lab 09/14/20 0106 09/14/20 0106 09/15/20 0119 09/15/20 0119 09/16/20 0202 09/17/20 0347 09/18/20 0438 09/19/20 0208 09/20/20 0103  WBC 25.8*   < > 28.4*   < > 27.2* 30.0* 46.9* 32.2* 27.5*  HGB 13.6   < > 14.3   < > 13.7 14.0 13.8 13.0 13.3  HCT 38.1*   < > 40.2   < > 39.7 40.2 39.1 38.1* 38.9*  PLT 316   < > 400   < > 352 356 333 293 277  MCV 87.2   < > 88.0   < > 89.0 89.5 90.9 91.4 93.1  MCH 31.1   < > 31.3   < > 30.7 31.2 32.1 31.2 31.8  MCHC 35.7   < > 35.6   < > 34.5 34.8 35.3 34.1 34.2  RDW 12.8   < > 13.0   < > 12.9 12.9 13.2 12.8 12.7  LYMPHSABS 0.3*  --  0.0*  --  0.5* 0.5*  --   --   --   MONOABS 0.9  --  0.3  --  0.8 0.8  --   --   --   EOSABS 0.0  --  0.0  --  0.0 0.0  --   --   --   BASOSABS 0.1  --  0.0  --  0.0 0.1   --   --   --    < > = values in this interval not displayed.    Chemistries  Recent Labs  Lab 09/16/20 0202 09/17/20 0347 09/18/20 0438 09/19/20 0208 09/20/20 0103  NA 131* 132* 131* 133* 133*  K 4.2 4.4 4.8 4.4 4.6  CL 97* 99 98 97* 98  CO2 22 25 25 25 25   GLUCOSE 198* 164* 171* 161* 155*  BUN 38* 38* 38* 40* 47*  CREATININE 1.54* 1.50* 1.52* 1.43* 1.51*  CALCIUM 9.0 9.0 8.7* 8.6* 8.4*  AST 80* 73* 68* 43* 42*  ALT 63* 68* 71* 55* 54*  ALKPHOS 111 107 121 106 102  BILITOT 1.1 1.1 1.1 0.7 1.1   ------------------------------------------------------------------------------------------------------------------ No results for input(s): CHOL, HDL, LDLCALC, TRIG,  CHOLHDL, LDLDIRECT in the last 72 hours.  No results found for: HGBA1C ------------------------------------------------------------------------------------------------------------------ No results for input(s): TSH, T4TOTAL, T3FREE, THYROIDAB in the last 72 hours.  Invalid input(s): FREET3 ------------------------------------------------------------------------------------------------------------------ No results for input(s): VITAMINB12, FOLATE, FERRITIN, TIBC, IRON, RETICCTPCT in the last 72 hours.  Coagulation profile No results for input(s): INR, PROTIME in the last 168 hours.  Recent Labs    09/19/20 0208 09/20/20 0103  DDIMER 0.68* 0.65*    Cardiac Enzymes No results for input(s): CKMB, TROPONINI, MYOGLOBIN in the last 168 hours.  Invalid input(s): CK ------------------------------------------------------------------------------------------------------------------    Component Value Date/Time   BNP 240.6 (H) 09/12/2020 1342    Micro Results Recent Results (from the past 240 hour(s))  Blood Culture (routine x 2)     Status: None   Collection Time: 09/12/20  2:44 PM   Specimen: BLOOD LEFT ARM  Result Value Ref Range Status   Specimen Description BLOOD LEFT ARM  Final   Special Requests   Final     BOTTLES DRAWN AEROBIC AND ANAEROBIC Blood Culture results may not be optimal due to an inadequate volume of blood received in culture bottles   Culture   Final    NO GROWTH 5 DAYS Performed at [facility], [address] [place], [place] [address]    Report Status 09/17/2020 FINAL  Final  Blood Culture (routine x 2)     Status: None   Collection Time: 09/12/20  3:00 PM   Specimen: BLOOD RIGHT ARM  Result Value Ref Range Status   Specimen Description BLOOD RIGHT ARM  Final   Special Requests   Final    BOTTLES DRAWN AEROBIC AND ANAEROBIC Blood Culture adequate volume   Culture   Final    NO GROWTH 5 DAYS Performed at [facility], [address] [place], [place] [address]    Report Status 09/17/2020 FINAL  Final  Respiratory Panel by RT PCR (Flu A&B, Covid) - Nasopharyngeal Swab     Status: Abnormal   Collection Time: 09/12/20  3:08 PM   Specimen: Nasopharyngeal Swab  Result Value Ref Range Status   SARS Coronavirus 2 by RT PCR POSITIVE (A) NEGATIVE Final    Comment: emailed L. Berdik RN 17:00 09/12/20 ([email or web address]) (NOTE) SARS-CoV-2 target nucleic acids are DETECTED.  SARS-CoV-2 RNA is generally detectable in upper respiratory specimens  during the acute phase of infection. Positive results are indicative of the presence of the identified virus, but do not rule out bacterial infection or co-infection with other pathogens not detected by the test. Clinical correlation with patient history and other diagnostic information is necessary to determine patient infection status. The expected result is Negative.  Fact Sheet for Patients:  https://www.moore.com/https://www.fda.gov/media/142436/download  Fact Sheet for Healthcare Providers: https://www.young.biz/https://www.fda.gov/media/142435/download  This test is not yet approved or cleared by the [place] FDA and  has been authorized for detection and/or diagnosis of SARS-CoV-2 by FDA under an Emergency Use Authorization (EUA).  This EUA will remain in  effect (meaning this test can be used) for the duration of  the COVID-19  declaration under Section 564(b)(1) of the Act, 21 U.S.C. section 360bbb-3(b)(1), unless the authorization is terminated or revoked sooner.      Influenza A by PCR NEGATIVE NEGATIVE Final   Influenza B by PCR NEGATIVE NEGATIVE Final    Comment: (NOTE) The Xpert Xpress SARS-CoV-2/FLU/RSV assay is intended as an aid in  the diagnosis of influenza from Nasopharyngeal swab specimens and  should not be used  as a sole basis for treatment. Nasal washings and  aspirates are unacceptable for Xpert Xpress SARS-CoV-2/FLU/RSV  testing.  Fact Sheet for Patients: https://www.moore.com/  Fact Sheet for Healthcare Providers: https://www.young.biz/  This test is not yet approved or cleared by the [place] FDA and  has been authorized for detection and/or diagnosis of SARS-CoV-2 by  FDA under an Emergency Use Authorization (EUA). This EUA will remain  in effect (meaning this test can be used) for the duration of the  Covid-19 declaration under Section 564(b)(1) of the Act, 21  U.S.C. section 360bbb-3(b)(1), unless the authorization is  terminated or revoked. Performed at [facility], [address], [place], [place] [address]     Radiology Reports CT Angio Chest PE W and/or Wo Contrast  Result Date: 09/12/2020 CLINICAL DATA:  79 year old male with elevated D-dimer. Concern for pulmonary embolism. EXAM: CT ANGIOGRAPHY CHEST WITH CONTRAST TECHNIQUE: Multidetector CT imaging of the chest was performed using the standard protocol during bolus administration of intravenous contrast. Multiplanar CT image reconstructions and MIPs were obtained to evaluate the vascular anatomy. CONTRAST:  75mL OMNIPAQUE IOHEXOL 350 MG/ML SOLN COMPARISON:  Chest radiograph dated 09/12/2020. FINDINGS: Evaluation of this exam is limited due to respiratory motion artifact. Cardiovascular: There is no  cardiomegaly or pericardial effusion. There is 3 vessel coronary vascular calcification. Moderate atherosclerotic calcification of the thoracic aorta. No aneurysmal dilatation or dissection. The origins of the great vessels of the aortic arch appear patent as visualized. Evaluation of the pulmonary arteries is limited due to respiratory motion artifact as well as streak artifact caused by patient's arms. No large or central pulmonary artery embolus identified. Mediastinum/Nodes: No hilar or mediastinal adenopathy. The esophagus and the thyroid gland are grossly unremarkable. No mediastinal fluid collection. Lungs/Pleura: Extensive patchy bilateral pulmonary opacities with predominant involvement of the lower lobes most consistent with multifocal pneumonia, likely viral or atypical in etiology including COVID-19. Clinical correlation is recommended. There are small bilateral pleural effusions. No pneumothorax. The central airways are patent. Upper Abdomen: No acute abnormality. Musculoskeletal: Degenerative changes of the spine. No acute osseous pathology. Review of the MIP images confirms the above findings. IMPRESSION: 1. No CT evidence of central pulmonary artery embolus. 2. Multifocal pneumonia, likely viral or atypical in etiology. Clinical correlation and follow-up to resolution recommended. 3. Small bilateral pleural effusions. 4. Aortic Atherosclerosis (ICD10-I70.0). Electronically Signed   By: Elgie Collard M.D.   On: 09/12/2020 19:05   DG Chest Port 1 View  Result Date: 09/12/2020 CLINICAL DATA:  Shortness of breath, COVID-19 positive EXAM: PORTABLE CHEST 1 VIEW COMPARISON:  10/07/2008 FINDINGS: Heart size within normal limits. Aortic atherosclerosis. There are diffuse interstitial opacities throughout the left lung, most confluent within the left lung base. Mild interstitial prominence within the right lung. No large pleural fluid collection. No pneumothorax. IMPRESSION: Diffuse interstitial  opacities throughout the left lung, most confluent within the left lung base. Findings concerning for multifocal atypical/viral infection versus asymmetric edema. Electronically Signed   By: Duanne Guess D.O.   On: 09/12/2020 14:59   DG Chest Port 1V same Day  Result Date: 09/15/2020 CLINICAL DATA:  Shortness of breath.  COVID-19 positive EXAM: PORTABLE CHEST 1 VIEW COMPARISON:  Chest radiograph and chest CT September 12, 2020 FINDINGS: There is patchy airspace opacity throughout the lungs bilaterally, similar to recent prior studies. No consolidation. Heart size and pulmonary vascularity within normal limits. No adenopathy. There is aortic atherosclerosis. No bone lesions IMPRESSION: Persistent multifocal airspace opacity consistent with  atypical organism pneumonia. No appreciable new opacity compared to recent studies. Stable cardiac silhouette. Aortic Atherosclerosis (ICD10-I70.0). Electronically Signed   By: Bretta Bang III M.D.   On: 09/15/2020 07:43   ECHOCARDIOGRAM COMPLETE  Result Date: 09/14/2020    ECHOCARDIOGRAM REPORT   Patient Name:   CARMIN DIBARTOLO Date of Exam: 09/13/2020 Medical Rec #:  [id number]       Height:       65.0 in Accession #:    [id number]      Weight:       145.0 lb Date of Birth:  1941-04-21       BSA:          1.725 m Patient Age:    79 years        BP:           141/76 mmHg Patient Gender: M               HR:           101 bpm. Exam Location:  Inpatient Procedure: 2D Echo, Cardiac Doppler and Color Doppler Indications:    Acute Respiratory Failure 518.82 / R06.89  History:        Patient has no prior history of Echocardiogram examinations.                 Risk Factors:Hypertension. Covid -19 Positive.  Sonographer:    Tiffany Dance Referring Phys: Jeoffrey Massed, M IMPRESSIONS  1. Left ventricular ejection fraction, by estimation, is 55 to 60%. The left ventricle has normal function. The left ventricle has no regional wall motion abnormalities. There is mild  concentric left ventricular hypertrophy. Left ventricular diastolic parameters were normal.  2. Right ventricular systolic function is normal. The right ventricular size is normal. There is normal pulmonary artery systolic pressure. The estimated right ventricular systolic pressure is 24.5 mmHg.  3. A small pericardial effusion is present. There is no evidence of cardiac tamponade.  4. The mitral valve is normal in structure. Trivial mitral valve regurgitation. No evidence of mitral stenosis.  5. The aortic valve is tricuspid. There is mild calcification of the aortic valve. Aortic valve regurgitation is not visualized. Mild aortic valve sclerosis is present, with no evidence of aortic valve stenosis.  6. The inferior vena cava is normal in size with greater than 50% respiratory variability, suggesting right atrial pressure of 3 mmHg. Comparison(s): No prior Echocardiogram. Conclusion(s)/Recommendation(s): Otherwise normal echocardiogram, with minor abnormalities described in the report. FINDINGS  Left Ventricle: Left ventricular ejection fraction, by estimation, is 55 to 60%. The left ventricle has normal function. The left ventricle has no regional wall motion abnormalities. The left ventricular internal cavity size was normal in size. There is  mild concentric left ventricular hypertrophy. Left ventricular diastolic parameters were normal. Right Ventricle: The right ventricular size is normal. No increase in right ventricular wall thickness. Right ventricular systolic function is normal. There is normal pulmonary artery systolic pressure. The tricuspid regurgitant velocity is 2.32 m/s, and  with an assumed right atrial pressure of 3 mmHg, the estimated right ventricular systolic pressure is 24.5 mmHg. Left Atrium: Left atrial size was normal in size. Right Atrium: Right atrial size was normal in size. Pericardium: A small pericardial effusion is present. There is no evidence of cardiac tamponade. Mitral Valve:  The mitral valve is normal in structure. Trivial mitral valve regurgitation. No evidence of mitral valve stenosis. Tricuspid Valve: The tricuspid valve is normal in structure. Tricuspid valve regurgitation is  trivial. No evidence of tricuspid stenosis. Aortic Valve: The aortic valve is tricuspid. There is mild calcification of the aortic valve. Aortic valve regurgitation is not visualized. Mild aortic valve sclerosis is present, with no evidence of aortic valve stenosis. Pulmonic Valve: The pulmonic valve was not well visualized. Pulmonic valve regurgitation is not visualized. No evidence of pulmonic stenosis. Aorta: The aortic root, ascending aorta and aortic arch are all structurally normal, with no evidence of dilitation or obstruction. Venous: The inferior vena cava is normal in size with greater than 50% respiratory variability, suggesting right atrial pressure of 3 mmHg. IAS/Shunts: No atrial level shunt detected by color flow Doppler.  LEFT VENTRICLE PLAX 2D LVIDd:         4.00 cm LVIDs:         2.77 cm LV PW:         1.24 cm LV IVS:        1.16 cm LVOT diam:     2.00 cm LV SV:         51 LV SV Index:   30 LVOT Area:     3.14 cm  RIGHT VENTRICLE          IVC RV Basal diam:  2.58 cm  IVC diam: 1.42 cm TAPSE (M-mode): 2.4 cm LEFT ATRIUM             Index       RIGHT ATRIUM           Index LA diam:        3.30 cm 1.91 cm/m  RA Area:     12.00 cm LA Vol (A2C):   41.1 ml 23.82 ml/m RA Volume:   23.40 ml  13.56 ml/m LA Vol (A4C):   34.8 ml 20.17 ml/m LA Biplane Vol: 40.1 ml 23.24 ml/m  AORTIC VALVE LVOT Vmax:   92.15 cm/s LVOT Vmean:  59.400 cm/s LVOT VTI:    0.163 m  AORTA Ao Root diam: 3.50 cm Ao Asc diam:  3.60 cm MITRAL VALVE               TRICUSPID VALVE MV Area (PHT): 2.76 cm    TR Peak grad:   21.5 mmHg MV Decel Time: 275 msec    TR Vmax:        232.00 cm/s MV E velocity: 60.60 cm/s MV A velocity: 85.70 cm/s  SHUNTS MV E/A ratio:  0.71        Systemic VTI:  0.16 m                            Systemic  Diam: 2.00 cm Jodelle Red MD Electronically signed by Jodelle Red MD Signature Date/Time: 09/14/2020/8:03:57 AM    Final    VAS [place] LOWER EXTREMITY VENOUS (DVT)  Result Date: 09/13/2020  Lower Venous DVTStudy Indications: Elevated Ddimer.  Risk Factors: COVID 19 positive. Comparison Study: No prior studies. Performing Technologist: Chanda Busing RVT  Examination Guidelines: A complete evaluation includes B-mode imaging, spectral Doppler, color Doppler, and power Doppler as needed of all accessible portions of each vessel. Bilateral testing is considered an integral part of a complete examination. Limited examinations for reoccurring indications may be performed as noted. The reflux portion of the exam is performed with the patient in reverse Trendelenburg.  +---------+---------------+---------+-----------+----------+--------------+ RIGHT    CompressibilityPhasicitySpontaneityPropertiesThrombus Aging +---------+---------------+---------+-----------+----------+--------------+ CFV      Full           Yes  Yes                                 +---------+---------------+---------+-----------+----------+--------------+ SFJ      Full                                                        +---------+---------------+---------+-----------+----------+--------------+ FV Prox  Full                                                        +---------+---------------+---------+-----------+----------+--------------+ FV Mid   Full                                                        +---------+---------------+---------+-----------+----------+--------------+ FV DistalFull                                                        +---------+---------------+---------+-----------+----------+--------------+ PFV      Full                                                        +---------+---------------+---------+-----------+----------+--------------+ POP      Full            Yes      Yes                                 +---------+---------------+---------+-----------+----------+--------------+ PTV      Full                                                        +---------+---------------+---------+-----------+----------+--------------+ PERO     Full                                                        +---------+---------------+---------+-----------+----------+--------------+   +---------+---------------+---------+-----------+----------+--------------+ LEFT     CompressibilityPhasicitySpontaneityPropertiesThrombus Aging +---------+---------------+---------+-----------+----------+--------------+ CFV      Full           Yes      Yes                                 +---------+---------------+---------+-----------+----------+--------------+ SFJ      Full                                                        +---------+---------------+---------+-----------+----------+--------------+  FV Prox  Full                                                        +---------+---------------+---------+-----------+----------+--------------+ FV Mid   Full                                                        +---------+---------------+---------+-----------+----------+--------------+ FV DistalFull                                                        +---------+---------------+---------+-----------+----------+--------------+ PFV      Full                                                        +---------+---------------+---------+-----------+----------+--------------+ POP      Full           Yes      Yes                                 +---------+---------------+---------+-----------+----------+--------------+ PTV      Full                                                        +---------+---------------+---------+-----------+----------+--------------+ PERO     Full                                                         +---------+---------------+---------+-----------+----------+--------------+     Summary: RIGHT: - There is no evidence of deep vein thrombosis in the lower extremity.  - No cystic structure found in the popliteal fossa.  LEFT: - There is no evidence of deep vein thrombosis in the lower extremity.  - No cystic structure found in the popliteal fossa.  *See table(s) above for measurements and observations. Electronically signed by Coral Else MD on 09/13/2020 at 8:53:51 PM.    Final

## 2020-09-20 NOTE — Progress Notes (Signed)
Physical Therapy Treatment Patient Details Name: Larry Olsen MRN: [id number] DOB: 06/29/1941 Today's Date: 09/20/2020    History of Present Illness 79 y.o. male with PMHx of HTN, HLD-known Covid 19+ approximately 2 weeks prior to this hospital stay-presenting with severe shortness of breath-found to have severe acute hypoxemic respiratory failure requiring 15 L of HFNC in the setting of COVID-19 pneumonia. CTA no PE, LE dopplers no DVT. Patient requesting comfort care as of 09/18/20    PT Comments    Patient continues to be very limited by elevated RR (into the 50s) with difficulty slowing his breathing despite multiple strategies. Did best when distracted and talking about his favorite groups and music (classic rock). Required incr time for recovery between all movements with RR decreasing to mid-30s at best. Only able to transfer OOB to [facility] and back to bed with chair position (he refused to sit in recliner due to discomfort 11/2.     Follow Up Recommendations  SNF;Supervision/Assistance - 24 hour (consider HH if family can provide 24/7 assist)     Equipment Recommendations  Rolling walker with 5" wheels    Recommendations for Other Services       Precautions / Restrictions Precautions Precautions: Fall    Mobility  Bed Mobility Overal bed mobility: Needs Assistance Bed Mobility: Supine to Sit;Sit to Supine     Supine to sit: Min assist Sit to supine: Min assist   General bed mobility comments: HOB elevated, assist to raise torso; on return assist to raise legs  Transfers Overall transfer level: Needs assistance Equipment used: 1 person hand held assist Transfers: Sit to/from [employer] Sit to Stand: Min assist Stand pivot transfers: Min assist       General transfer comment: to/from [facility]; pt reaching for UE support; refused transfer to recliner due to discomfort (he does have blue air cushion in seat)  Ambulation/Gait             General  Gait Details: unable due to coughing, RR 50s   Stairs             Wheelchair Mobility    Modified Rankin (Stroke Patients Only)       Balance Overall balance assessment: Needs assistance Sitting-balance support: No upper extremity supported;Feet supported Sitting balance-Leahy Scale: Fair     Standing balance support: Single extremity supported Standing balance-Leahy Scale: Poor                              Cognition Arousal/Alertness: Awake/alert Behavior During Therapy: Anxious;Impulsive Overall Cognitive Status: Within Functional Limits for tasks assessed                                 General Comments: irritated, not feeling well; mood improved with playing Thermon Leyland, [employer] via [employer]      Exercises Other Exercises Other Exercises: Educated on pursed lip breathing to try to slow RR. Patient with poor ability to replicate. Patient did better when distracted discussing his favorite music (and pulling if up on YouTube music). Also does better with holding a warm, wet washcloth over his nose/mouth to breath through (this helps him slow rate).    General Comments General comments (skin integrity, edema, etc.): on 3L throughout; sats >=85% however RR up to 50s with difficulty lowering his rate      Pertinent Vitals/Pain Pain Assessment: Faces Faces  Pain Scale: Hurts little more Pain Location: back Pain Descriptors / Indicators: Discomfort Pain Intervention(s): Limited activity within patient's tolerance;Monitored during session    Home Living                      Prior Function            PT Goals (current goals can now be found in the care plan section) Acute Rehab PT Goals Patient Stated Goal: did not state Time For Goal Achievement: 10/03/20 Potential to Achieve Goals: Fair Progress towards PT goals: Progressing toward goals    Frequency    Min 3X/week      PT Plan Current plan remains  appropriate    Co-evaluation              AM-PAC PT "6 Clicks" Mobility   Outcome Measure  Help needed turning from your back to your side while in a flat bed without using bedrails?: A Little Help needed moving from lying on your back to sitting on the side of a flat bed without using bedrails?: A Little Help needed moving to and from a bed to a chair (including a wheelchair)?: A Little Help needed standing up from a chair using your arms (e.g., wheelchair or bedside chair)?: A Little Help needed to walk in hospital room?: Total Help needed climbing 3-5 steps with a railing? : Total 6 Click Score: 14    End of Session Equipment Utilized During Treatment: Oxygen Activity Tolerance: Treatment limited secondary to medical complications (Comment) (incr RR;  incr anxiety) Patient left: with call bell/phone within reach;in bed Nurse Communication: Mobility status;Other (comment) (BM on BSC; return to chair position in bed (pt refused chair) PT Visit Diagnosis: Muscle weakness (generalized) (M62.81);Difficulty in walking, not elsewhere classified (R26.2)     Time: 1610-9604 PT Time Calculation (min) (ACUTE ONLY): 51 min  Charges:  $Therapeutic Activity: 8-22 mins $Self Care/Home Management: 23-37                      Jerolyn Center, PT Pager [phone]    Zena Amos 09/20/2020, 1:31 PM

## 2020-09-21 DIAGNOSIS — Z515 Encounter for palliative care: Secondary | ICD-10-CM | POA: Diagnosis not present

## 2020-09-21 DIAGNOSIS — U071 COVID-19: Secondary | ICD-10-CM | POA: Diagnosis not present

## 2020-09-21 DIAGNOSIS — J9601 Acute respiratory failure with hypoxia: Secondary | ICD-10-CM | POA: Diagnosis not present

## 2020-09-21 DIAGNOSIS — Z7189 Other specified counseling: Secondary | ICD-10-CM | POA: Diagnosis not present

## 2020-09-21 LAB — COMPREHENSIVE METABOLIC PANEL
ALT: 89 U/L — ABNORMAL HIGH (ref 0–44)
AST: 71 U/L — ABNORMAL HIGH (ref 15–41)
Albumin: 2.7 g/dL — ABNORMAL LOW (ref 3.5–5.0)
Alkaline Phosphatase: 104 U/L (ref 38–126)
Anion gap: 8 (ref 5–15)
BUN: 43 mg/dL — ABNORMAL HIGH (ref 8–23)
CO2: 25 mmol/L (ref 22–32)
Calcium: 8.6 mg/dL — ABNORMAL LOW (ref 8.9–10.3)
Chloride: 99 mmol/L (ref 98–111)
Creatinine, Ser: 1.46 mg/dL — ABNORMAL HIGH (ref 0.61–1.24)
GFR, Estimated: 48 mL/min — ABNORMAL LOW (ref >60)
Glucose, Bld: 152 mg/dL — ABNORMAL HIGH (ref 70–99)
Potassium: 5.2 mmol/L — ABNORMAL HIGH (ref 3.5–5.1)
Sodium: 132 mmol/L — ABNORMAL LOW (ref 135–145)
Total Bilirubin: 0.9 mg/dL (ref 0.3–1.2)
Total Protein: 5.2 g/dL — ABNORMAL LOW (ref 6.5–8.1)

## 2020-09-21 LAB — CBC
HCT: 39.1 % (ref 39.0–52.0)
Hemoglobin: 13.4 g/dL (ref 13.0–17.0)
MCH: 31.5 pg (ref 26.0–34.0)
MCHC: 34.3 g/dL (ref 30.0–36.0)
MCV: 92 fL (ref 80.0–100.0)
Platelets: 264 10*3/uL (ref 150–400)
RBC: 4.25 MIL/uL (ref 4.22–5.81)
RDW: 12.6 % (ref 11.5–15.5)
WBC: 25.8 10*3/uL — ABNORMAL HIGH (ref 4.0–10.5)
nRBC: 0 % (ref 0.0–0.2)

## 2020-09-21 LAB — C-REACTIVE PROTEIN: CRP: 0.6 mg/dL (ref <1.0)

## 2020-09-21 LAB — D-DIMER, QUANTITATIVE: D-Dimer, Quant: 0.57 ug/mL-FEU — ABNORMAL HIGH (ref 0.00–0.50)

## 2020-09-21 MED ORDER — FUROSEMIDE 40 MG PO TABS
40.0000 mg | ORAL_TABLET | Freq: Every day | ORAL | Status: DC
  Administered 2020-09-21: 40 mg via ORAL
  Filled 2020-09-21: qty 1

## 2020-09-21 MED ORDER — ENOXAPARIN SODIUM 40 MG/0.4ML ~~LOC~~ SOLN
40.0000 mg | SUBCUTANEOUS | Status: DC
  Administered 2020-09-22 – 2020-09-24 (×3): 40 mg via SUBCUTANEOUS
  Filled 2020-09-21 (×3): qty 0.4

## 2020-09-21 MED ORDER — SODIUM ZIRCONIUM CYCLOSILICATE 10 G PO PACK
10.0000 g | PACK | Freq: Every day | ORAL | Status: DC
  Administered 2020-09-21: 10 g via ORAL
  Filled 2020-09-21: qty 1

## 2020-09-21 NOTE — Progress Notes (Signed)
PROGRESS NOTE                                                                                                                                                                                                             Patient Demographics:    Larry Olsen, is a 79 y.o. male, DOB - 1941-10-19, AOZ:[id number]  Outpatient Primary MD for the patient is Patient, No Pcp Per   Admit date - 09/12/2020   LOS - 9  Chief Complaint  Patient presents with   Shortness of Breath   Covid Positive       Brief Narrative: Patient is a 79 y.o. male with PMHx of HTN, HLD-known Covid 19+ approximately 2 weeks prior to this hospital stay-presenting with severe shortness of breath-found to have severe acute hypoxemic respiratory failure requiring 15 L of HFNC in the setting of COVID-19 pneumonia.  See below for further details  COVID-19 vaccinated status: Unvaccinated  Significant Events: 10/26>> Admit to [facility] for severe hypoxia due to COVID-19 pneumonia, AKI and hyponatremia.  Significant studies: 10/26>>Chest x-ray: Diffuse interstitial airspace opacities 10/26>> CTA chest: No PE, multifocal pneumonia 10/27>> bilateral lower extremity Doppler: No DVT 10/27>> Echo: EF 55-60%, no wall motion abnormalities, small pericardial effusion  COVID-19 medications: Steroids: 10/26>> Remdesivir: 10/26>> 10/30 Actemra: 10/26 x 1  Antibiotics: None  Microbiology data: 10/26 >>blood culture: No growth  Procedures: None  Consults: None  DVT prophylaxis: Prophylactic Lovenox   Subjective:   Less anxious-stable at rest but gets very symptomatic with minimal movement-he was walking with physical therapy and was up to the sink-very tachypneic-with respiratory rate in the 40s at least.   Assessment  & Plan :   Acute Hypoxic Resp Failure due to Covid 19 Viral pneumonia: Had severe hypoxemia-was on heated high flow and NRB at  one-point-remarkable improvement over the past few days-Down to 2 L of oxygen.  Although significantly improved-he is still very symptomatic with minimal activity-and gets tachypneic very easily.  Suspect some amount of anxiety playing a role-starting to develop 1+ pitting edema-we will go ahead and start Lasix-continue to follow closely and await further clinical improvement before consideration of discharge.    Fever: afebrile O2 requirements:  SpO2: 99 % O2 Flow Rate (L/min): 2 L/min FiO2 (%): (S) 40 %   COVID-19 Labs: Recent Labs    09/19/20 0208  09/20/20 0103 09/21/20 0206  DDIMER 0.68* 0.65* 0.57*  CRP 0.7 0.6 0.6       Component Value Date/Time   BNP 240.6 (H) 09/12/2020 1342    No results for input(s): PROCALCITON in the last 168 hours.  Lab Results  Component Value Date   SARSCOV2NAA POSITIVE (A) 09/12/2020    Prone/Incentive Spirometry: encouraged patient to lie prone for 3-4 hours at a time for a total of 16 hours a day, and to encourage incentive spirometry use 3-4/hour.  Leukocytosis: Secondary to steroids-no indication for bacterial infection.  WBC count slowly trending down.  Elevated D-dimer: Secondary to COVID-19-CTA chest negative for PE/lower extremity Dopplers negative for DVT-on intermediate dosing of Lovenox-okay to change to daily dosing today.  AKI: Likely hemodynamically mediated-had resolved on 10/27-but then again worsened-no slowly downtrending with supportive care.  Avoid nephrotoxic agents.  Hyponatremia: Secondary to hypovolemia/dehydration-improved after gentle hydration.  Volume status remains stable.  Transaminitis: Secondary to COVID-19-appears mild-stable for close monitoring/follow-up.  Troponin elevation: Trend is flat-not consistent with ACS-likely demand ischemia due to severe hypoxemia/AKI-echo with preserved EF-no wall motion abnormality.  Doubt further work-up required while inpatient.  HTN: BP stable-continue amlodipine. Continue  to hold losartan due to AKI.   HLD: Cautiously continue with statin-watch LFTs.  Palliative care discussion: DNR in place-multiple discussions with the patient and daughter over the past few days.  He does not desire aggressive care-he actually had indicated that he wanted to be transitioned to comfort measures but given clinical improvement-agrees that we ought to continue with current measures including PT/OT.  GI prophylaxis: PPI  ABG: No results found for: PHART, PCO2ART, PO2ART, HCO3, TCO2, ACIDBASEDEF, O2SAT  Vent Settings: N/A    Condition -stable.  Family Communication  : Daughter Maureen Ralphs [phone]) over the phone on 11/4  Code Status:DNR  Diet :  Diet Order            Diet Heart Room service appropriate? Yes; Fluid consistency: Thin  Diet effective now                  Disposition Plan  :   Status is: Inpatient  Remains inpatient appropriate because:Inpatient level of care appropriate due to severity of illness   Dispo: The patient is from: Home              Anticipated d/c is to: TBD              Anticipated d/c date is: > 3 days              Patient currently is not medically stable to d/c.   Barriers to discharge: Hypoxia requiring O2 supplementation/severe exertional dyspnea with minimal ambulation-respiratory rate at times in the 40s/50s.  Antimicorbials  :    Anti-infectives (From admission, onward)   Start     Dose/Rate Route Frequency Ordered Stop   09/13/20 1000  remdesivir 100 mg in sodium chloride 0.9 % 100 mL IVPB       "Followed by" Linked Group Details   100 mg 200 mL/hr over 30 Minutes Intravenous Daily 09/12/20 1509 09/16/20 0823   09/12/20 1630  remdesivir 200 mg in sodium chloride 0.9% 250 mL IVPB       "Followed by" Linked Group Details   200 mg 580 mL/hr over 30 Minutes Intravenous Once 09/12/20 1509 09/13/20 0915      Inpatient Medications  Scheduled Meds:  amLODipine  2.5 mg Oral Daily   atorvastatin  10 mg Oral Daily     [  START ON 09/22/2020] enoxaparin (LOVENOX) injection  40 mg Subcutaneous Q24H   feeding supplement  237 mL Oral BID BM   methylPREDNISolone (SOLU-MEDROL) injection  40 mg Intravenous Q12H   pantoprazole  40 mg Oral Q1200   sodium chloride flush  3 mL Intravenous Q12H   sodium zirconium cyclosilicate  10 g Oral Daily   Continuous Infusions:  PRN Meds:.acetaminophen, albuterol, antiseptic oral rinse, chlorpheniramine-HYDROcodone, clonazePAM, guaiFENesin-dextromethorphan, ondansetron (ZOFRAN) IV, oxymetazoline, polyethylene glycol, sodium chloride   Time Spent in minutes  25   See all Orders from today for further details   Jeoffrey Massed M.D on 09/21/2020 at 2:54 PM  To page go to [email or web address] - use universal password  [facility] -  Office  [phone]    Objective:   Vitals:   09/21/20 0000 09/21/20 0400 09/21/20 0748 09/21/20 1155  BP: (!) 149/77 124/80 133/75 126/71  Pulse: 85 71  87  Resp: 19 19  (!) 22  Temp: 97.8 F (36.6 C) 97.6 F (36.4 C) (!) 97.4 F (36.3 C) 97.8 F (36.6 C)  TempSrc: Oral Oral Oral Oral  SpO2: 100% 100% 99%   Weight:      Height:        Wt Readings from Last 3 Encounters:  09/12/20 65.8 kg     Intake/Output Summary (Last 24 hours) at 09/21/2020 1454 Last data filed at 09/21/2020 1129 Gross per 24 hour  Intake 960 ml  Output 1400 ml  Net -440 ml     Physical Exam Gen Exam:Alert awake-not in any distress HEENT:atraumatic, normocephalic Chest: B/L clear to auscultation anteriorly CVS:S1S2 regular Abdomen:soft non tender, non distended Extremities:+ edema Neurology: Non focal Skin: no rash   Data Review:    CBC Recent Labs  Lab 09/15/20 0119 09/15/20 0119 09/16/20 0202 09/16/20 0202 09/17/20 0347 09/18/20 0438 09/19/20 0208 09/20/20 0103 09/21/20 0206  WBC 28.4*   < > 27.2*   < > 30.0* 46.9* 32.2* 27.5* 25.8*  HGB 14.3   < > 13.7   < > 14.0 13.8 13.0 13.3 13.4  HCT 40.2   < > 39.7   < > 40.2 39.1  38.1* 38.9* 39.1  PLT 400   < > 352   < > 356 333 293 277 264  MCV 88.0   < > 89.0   < > 89.5 90.9 91.4 93.1 92.0  MCH 31.3   < > 30.7   < > 31.2 32.1 31.2 31.8 31.5  MCHC 35.6   < > 34.5   < > 34.8 35.3 34.1 34.2 34.3  RDW 13.0   < > 12.9   < > 12.9 13.2 12.8 12.7 12.6  LYMPHSABS 0.0*  --  0.5*  --  0.5*  --   --   --   --   MONOABS 0.3  --  0.8  --  0.8  --   --   --   --   EOSABS 0.0  --  0.0  --  0.0  --   --   --   --   BASOSABS 0.0  --  0.0  --  0.1  --   --   --   --    < > = values in this interval not displayed.    Chemistries  Recent Labs  Lab 09/17/20 0347 09/18/20 0438 09/19/20 0208 09/20/20 0103 09/21/20 0206  NA 132* 131* 133* 133* 132*  K 4.4 4.8 4.4 4.6 5.2*  CL 99 98 97* 98 99  CO2 25 25 25 25 25   GLUCOSE 164* 171* 161* 155* 152*  BUN 38* 38* 40* 47* 43*  CREATININE 1.50* 1.52* 1.43* 1.51* 1.46*  CALCIUM 9.0 8.7* 8.6* 8.4* 8.6*  AST 73* 68* 43* 42* 71*  ALT 68* 71* 55* 54* 89*  ALKPHOS 107 121 106 102 104  BILITOT 1.1 1.1 0.7 1.1 0.9   ------------------------------------------------------------------------------------------------------------------ No results for input(s): CHOL, HDL, LDLCALC, TRIG, CHOLHDL, LDLDIRECT in the last 72 hours.  No results found for: HGBA1C ------------------------------------------------------------------------------------------------------------------ No results for input(s): TSH, T4TOTAL, T3FREE, THYROIDAB in the last 72 hours.  Invalid input(s): FREET3 ------------------------------------------------------------------------------------------------------------------ No results for input(s): VITAMINB12, FOLATE, FERRITIN, TIBC, IRON, RETICCTPCT in the last 72 hours.  Coagulation profile No results for input(s): INR, PROTIME in the last 168 hours.  Recent Labs    09/20/20 0103 09/21/20 0206  DDIMER 0.65* 0.57*    Cardiac Enzymes No results for input(s): CKMB, TROPONINI, MYOGLOBIN in the last 168 hours.  Invalid  input(s): CK ------------------------------------------------------------------------------------------------------------------    Component Value Date/Time   BNP 240.6 (H) 09/12/2020 1342    Micro Results Recent Results (from the past 240 hour(s))  Blood Culture (routine x 2)     Status: None   Collection Time: 09/12/20  2:44 PM   Specimen: BLOOD LEFT ARM  Result Value Ref Range Status   Specimen Description BLOOD LEFT ARM  Final   Special Requests   Final    BOTTLES DRAWN AEROBIC AND ANAEROBIC Blood Culture results may not be optimal due to an inadequate volume of blood received in culture bottles   Culture   Final    NO GROWTH 5 DAYS Performed at [facility], [address] [place], [place]    Report Status 09/17/2020 FINAL  Final  Blood Culture (routine x 2)     Status: None   Collection Time: 09/12/20  3:00 PM   Specimen: BLOOD RIGHT ARM  Result Value Ref Range Status   Specimen Description BLOOD RIGHT ARM  Final   Special Requests   Final    BOTTLES DRAWN AEROBIC AND ANAEROBIC Blood Culture adequate volume   Culture   Final    NO GROWTH 5 DAYS Performed at [facility], [address] [place], [place]    Report Status 09/17/2020 FINAL  Final  Respiratory Panel by RT PCR (Flu A&B, Covid) - Nasopharyngeal Swab     Status: Abnormal   Collection Time: 09/12/20  3:08 PM   Specimen: Nasopharyngeal Swab  Result Value Ref Range Status   SARS Coronavirus 2 by RT PCR POSITIVE (A) NEGATIVE Final    Comment: emailed L. Berdik RN 17:00 09/12/20 ([email or web address]) (NOTE) SARS-CoV-2 target nucleic acids are DETECTED.  SARS-CoV-2 RNA is generally detectable in upper respiratory specimens  during the acute phase of infection. Positive results are indicative of the presence of the identified virus, but do not rule out bacterial infection or co-infection with other pathogens not detected by the test. Clinical correlation with patient history and other  diagnostic information is necessary to determine patient infection status. The expected result is Negative.  Fact Sheet for Patients:  09/14/20  Fact Sheet for Healthcare Providers: https://www.moore.com/  This test is not yet approved or cleared by the https://www.young.biz/ FDA and  has been authorized for detection and/or diagnosis of SARS-CoV-2 by FDA under an Emergency Use Authorization (EUA).  This EUA will remain in effect (meaning this test can be used) for the duration of  the  COVID-19  declaration under Section 564(b)(1) of the Act, 21 U.S.C. section 360bbb-3(b)(1), unless the authorization is terminated or revoked sooner.      Influenza A by PCR NEGATIVE NEGATIVE Final   Influenza B by PCR NEGATIVE NEGATIVE Final    Comment: (NOTE) The Xpert Xpress SARS-CoV-2/FLU/RSV assay is intended as an aid in  the diagnosis of influenza from Nasopharyngeal swab specimens and  should not be used as a sole basis for treatment. Nasal washings and  aspirates are unacceptable for Xpert Xpress SARS-CoV-2/FLU/RSV  testing.  Fact Sheet for Patients: https://www.moore.com/  Fact Sheet for Healthcare Providers: https://www.young.biz/  This test is not yet approved or cleared by the [place] FDA and  has been authorized for detection and/or diagnosis of SARS-CoV-2 by  FDA under an Emergency Use Authorization (EUA). This EUA will remain  in effect (meaning this test can be used) for the duration of the  Covid-19 declaration under Section 564(b)(1) of the Act, 21  U.S.C. section 360bbb-3(b)(1), unless the authorization is  terminated or revoked. Performed at [facility], [address] [place], [place] [address]     Radiology Reports CT Angio Chest PE W and/or Wo Contrast  Result Date: 09/12/2020 CLINICAL DATA:  79 year old male with elevated D-dimer. Concern for pulmonary embolism. EXAM:  CT ANGIOGRAPHY CHEST WITH CONTRAST TECHNIQUE: Multidetector CT imaging of the chest was performed using the standard protocol during bolus administration of intravenous contrast. Multiplanar CT image reconstructions and MIPs were obtained to evaluate the vascular anatomy. CONTRAST:  75mL OMNIPAQUE IOHEXOL 350 MG/ML SOLN COMPARISON:  Chest radiograph dated 09/12/2020. FINDINGS: Evaluation of this exam is limited due to respiratory motion artifact. Cardiovascular: There is no cardiomegaly or pericardial effusion. There is 3 vessel coronary vascular calcification. Moderate atherosclerotic calcification of the thoracic aorta. No aneurysmal dilatation or dissection. The origins of the great vessels of the aortic arch appear patent as visualized. Evaluation of the pulmonary arteries is limited due to respiratory motion artifact as well as streak artifact caused by patient's arms. No large or central pulmonary artery embolus identified. Mediastinum/Nodes: No hilar or mediastinal adenopathy. The esophagus and the thyroid gland are grossly unremarkable. No mediastinal fluid collection. Lungs/Pleura: Extensive patchy bilateral pulmonary opacities with predominant involvement of the lower lobes most consistent with multifocal pneumonia, likely viral or atypical in etiology including COVID-19. Clinical correlation is recommended. There are small bilateral pleural effusions. No pneumothorax. The central airways are patent. Upper Abdomen: No acute abnormality. Musculoskeletal: Degenerative changes of the spine. No acute osseous pathology. Review of the MIP images confirms the above findings. IMPRESSION: 1. No CT evidence of central pulmonary artery embolus. 2. Multifocal pneumonia, likely viral or atypical in etiology. Clinical correlation and follow-up to resolution recommended. 3. Small bilateral pleural effusions. 4. Aortic Atherosclerosis (ICD10-I70.0). Electronically Signed   By: Elgie Collard M.D.   On: 09/12/2020 19:05     DG Chest Port 1 View  Result Date: 09/12/2020 CLINICAL DATA:  Shortness of breath, COVID-19 positive EXAM: PORTABLE CHEST 1 VIEW COMPARISON:  10/07/2008 FINDINGS: Heart size within normal limits. Aortic atherosclerosis. There are diffuse interstitial opacities throughout the left lung, most confluent within the left lung base. Mild interstitial prominence within the right lung. No large pleural fluid collection. No pneumothorax. IMPRESSION: Diffuse interstitial opacities throughout the left lung, most confluent within the left lung base. Findings concerning for multifocal atypical/viral infection versus asymmetric edema. Electronically Signed   By: Duanne Guess D.O.   On: 09/12/2020 14:59   DG  Chest Port 1V same Day  Result Date: 09/15/2020 CLINICAL DATA:  Shortness of breath.  COVID-19 positive EXAM: PORTABLE CHEST 1 VIEW COMPARISON:  Chest radiograph and chest CT September 12, 2020 FINDINGS: There is patchy airspace opacity throughout the lungs bilaterally, similar to recent prior studies. No consolidation. Heart size and pulmonary vascularity within normal limits. No adenopathy. There is aortic atherosclerosis. No bone lesions IMPRESSION: Persistent multifocal airspace opacity consistent with atypical organism pneumonia. No appreciable new opacity compared to recent studies. Stable cardiac silhouette. Aortic Atherosclerosis (ICD10-I70.0). Electronically Signed   By: Bretta Bang III M.D.   On: 09/15/2020 07:43   ECHOCARDIOGRAM COMPLETE  Result Date: 09/14/2020    ECHOCARDIOGRAM REPORT   Patient Name:   FARIS COOLMAN Date of Exam: 09/13/2020 Medical Rec #:  [id number]       Height:       65.0 in Accession #:    [id number]      Weight:       145.0 lb Date of Birth:  1941/05/26       BSA:          1.725 m Patient Age:    79 years        BP:           141/76 mmHg Patient Gender: M               HR:           101 bpm. Exam Location:  Inpatient Procedure: 2D Echo, Cardiac Doppler and Color  Doppler Indications:    Acute Respiratory Failure 518.82 / R06.89  History:        Patient has no prior history of Echocardiogram examinations.                 Risk Factors:Hypertension. Covid -19 Positive.  Sonographer:    Tiffany Dance Referring Phys: Jeoffrey Massed, M IMPRESSIONS  1. Left ventricular ejection fraction, by estimation, is 55 to 60%. The left ventricle has normal function. The left ventricle has no regional wall motion abnormalities. There is mild concentric left ventricular hypertrophy. Left ventricular diastolic parameters were normal.  2. Right ventricular systolic function is normal. The right ventricular size is normal. There is normal pulmonary artery systolic pressure. The estimated right ventricular systolic pressure is 24.5 mmHg.  3. A small pericardial effusion is present. There is no evidence of cardiac tamponade.  4. The mitral valve is normal in structure. Trivial mitral valve regurgitation. No evidence of mitral stenosis.  5. The aortic valve is tricuspid. There is mild calcification of the aortic valve. Aortic valve regurgitation is not visualized. Mild aortic valve sclerosis is present, with no evidence of aortic valve stenosis.  6. The inferior vena cava is normal in size with greater than 50% respiratory variability, suggesting right atrial pressure of 3 mmHg. Comparison(s): No prior Echocardiogram. Conclusion(s)/Recommendation(s): Otherwise normal echocardiogram, with minor abnormalities described in the report. FINDINGS  Left Ventricle: Left ventricular ejection fraction, by estimation, is 55 to 60%. The left ventricle has normal function. The left ventricle has no regional wall motion abnormalities. The left ventricular internal cavity size was normal in size. There is  mild concentric left ventricular hypertrophy. Left ventricular diastolic parameters were normal. Right Ventricle: The right ventricular size is normal. No increase in right ventricular wall thickness. Right  ventricular systolic function is normal. There is normal pulmonary artery systolic pressure. The tricuspid regurgitant velocity is 2.32 m/s, and  with an assumed right atrial pressure of 3  mmHg, the estimated right ventricular systolic pressure is 24.5 mmHg. Left Atrium: Left atrial size was normal in size. Right Atrium: Right atrial size was normal in size. Pericardium: A small pericardial effusion is present. There is no evidence of cardiac tamponade. Mitral Valve: The mitral valve is normal in structure. Trivial mitral valve regurgitation. No evidence of mitral valve stenosis. Tricuspid Valve: The tricuspid valve is normal in structure. Tricuspid valve regurgitation is trivial. No evidence of tricuspid stenosis. Aortic Valve: The aortic valve is tricuspid. There is mild calcification of the aortic valve. Aortic valve regurgitation is not visualized. Mild aortic valve sclerosis is present, with no evidence of aortic valve stenosis. Pulmonic Valve: The pulmonic valve was not well visualized. Pulmonic valve regurgitation is not visualized. No evidence of pulmonic stenosis. Aorta: The aortic root, ascending aorta and aortic arch are all structurally normal, with no evidence of dilitation or obstruction. Venous: The inferior vena cava is normal in size with greater than 50% respiratory variability, suggesting right atrial pressure of 3 mmHg. IAS/Shunts: No atrial level shunt detected by color flow Doppler.  LEFT VENTRICLE PLAX 2D LVIDd:         4.00 cm LVIDs:         2.77 cm LV PW:         1.24 cm LV IVS:        1.16 cm LVOT diam:     2.00 cm LV SV:         51 LV SV Index:   30 LVOT Area:     3.14 cm  RIGHT VENTRICLE          IVC RV Basal diam:  2.58 cm  IVC diam: 1.42 cm TAPSE (M-mode): 2.4 cm LEFT ATRIUM             Index       RIGHT ATRIUM           Index LA diam:        3.30 cm 1.91 cm/m  RA Area:     12.00 cm LA Vol (A2C):   41.1 ml 23.82 ml/m RA Volume:   23.40 ml  13.56 ml/m LA Vol (A4C):   34.8 ml 20.17  ml/m LA Biplane Vol: 40.1 ml 23.24 ml/m  AORTIC VALVE LVOT Vmax:   92.15 cm/s LVOT Vmean:  59.400 cm/s LVOT VTI:    0.163 m  AORTA Ao Root diam: 3.50 cm Ao Asc diam:  3.60 cm MITRAL VALVE               TRICUSPID VALVE MV Area (PHT): 2.76 cm    TR Peak grad:   21.5 mmHg MV Decel Time: 275 msec    TR Vmax:        232.00 cm/s MV E velocity: 60.60 cm/s MV A velocity: 85.70 cm/s  SHUNTS MV E/A ratio:  0.71        Systemic VTI:  0.16 m                            Systemic Diam: 2.00 cm Jodelle Red MD Electronically signed by Jodelle Red MD Signature Date/Time: 09/14/2020/8:03:57 AM    Final    VAS [place] LOWER EXTREMITY VENOUS (DVT)  Result Date: 09/13/2020  Lower Venous DVTStudy Indications: Elevated Ddimer.  Risk Factors: COVID 19 positive. Comparison Study: No prior studies. Performing Technologist: Chanda Busing RVT  Examination Guidelines: A complete evaluation includes B-mode imaging, spectral Doppler, color Doppler,  and power Doppler as needed of all accessible portions of each vessel. Bilateral testing is considered an integral part of a complete examination. Limited examinations for reoccurring indications may be performed as noted. The reflux portion of the exam is performed with the patient in reverse Trendelenburg.  +---------+---------------+---------+-----------+----------+--------------+  RIGHT     Compressibility Phasicity Spontaneity Properties Thrombus Aging  +---------+---------------+---------+-----------+----------+--------------+  CFV       Full            Yes       Yes                                    +---------+---------------+---------+-----------+----------+--------------+  SFJ       Full                                                             +---------+---------------+---------+-----------+----------+--------------+  FV Prox   Full                                                              +---------+---------------+---------+-----------+----------+--------------+  FV Mid    Full                                                             +---------+---------------+---------+-----------+----------+--------------+  FV Distal Full                                                             +---------+---------------+---------+-----------+----------+--------------+  PFV       Full                                                             +---------+---------------+---------+-----------+----------+--------------+  POP       Full            Yes       Yes                                    +---------+---------------+---------+-----------+----------+--------------+  PTV       Full                                                             +---------+---------------+---------+-----------+----------+--------------+  PERO      Full                                                             +---------+---------------+---------+-----------+----------+--------------+   +---------+---------------+---------+-----------+----------+--------------+  LEFT      Compressibility Phasicity Spontaneity Properties Thrombus Aging  +---------+---------------+---------+-----------+----------+--------------+  CFV       Full            Yes       Yes                                    +---------+---------------+---------+-----------+----------+--------------+  SFJ       Full                                                             +---------+---------------+---------+-----------+----------+--------------+  FV Prox   Full                                                             +---------+---------------+---------+-----------+----------+--------------+  FV Mid    Full                                                             +---------+---------------+---------+-----------+----------+--------------+  FV Distal Full                                                              +---------+---------------+---------+-----------+----------+--------------+  PFV       Full                                                             +---------+---------------+---------+-----------+----------+--------------+  POP       Full            Yes       Yes                                    +---------+---------------+---------+-----------+----------+--------------+  PTV       Full                                                             +---------+---------------+---------+-----------+----------+--------------+  PERO      Full                                                             +---------+---------------+---------+-----------+----------+--------------+     Summary: RIGHT: - There is no evidence of deep vein thrombosis in the lower extremity.  - No cystic structure found in the popliteal fossa.  LEFT: - There is no evidence of deep vein thrombosis in the lower extremity.  - No cystic structure found in the popliteal fossa.  *See table(s) above for measurements and observations. Electronically signed by Coral ElseVance Brabham MD on 09/13/2020 at 8:53:51 PM.    Final

## 2020-09-21 NOTE — Plan of Care (Signed)
  Problem: Education: Goal: Knowledge of risk factors and measures for prevention of condition will improve Outcome: Progressing   Problem: Coping: Goal: Psychosocial and spiritual needs will be supported Outcome: Progressing   Problem: Respiratory: Goal: Will maintain a patent airway Outcome: Progressing Goal: Complications related to the disease process, condition or treatment will be avoided or minimized Outcome: Progressing   Problem: Clinical Measurements: Goal: Ability to maintain clinical measurements within normal limits will improve Outcome: Progressing Goal: Respiratory complications will improve Outcome: Progressing   Problem: Nutrition: Goal: Adequate nutrition will be maintained Outcome: Progressing   Problem: Elimination: Goal: Will not experience complications related to bowel motility Outcome: Progressing Goal: Will not experience complications related to urinary retention Outcome: Progressing   Problem: Safety: Goal: Ability to remain free from injury will improve Outcome: Progressing   Problem: Skin Integrity: Goal: Risk for impaired skin integrity will decrease Outcome: Progressing   Problem: Education: Goal: Knowledge of risk factors and measures for prevention of condition will improve Outcome: Progressing   Problem: Coping: Goal: Psychosocial and spiritual needs will be supported Outcome: Progressing   Problem: Respiratory: Goal: Will maintain a patent airway Outcome: Progressing Goal: Complications related to the disease process, condition or treatment will be avoided or minimized Outcome: Progressing   

## 2020-09-21 NOTE — Progress Notes (Signed)
Occupational Therapy Treatment Patient Details Name: Larry Olsen MRN: [id number] DOB: 1941/10/08 Today's Date: 09/21/2020    History of present illness 79 y.o. male with PMHx of HTN, HLD-known Covid 19+ approximately 2 weeks prior to this hospital stay-presenting with severe shortness of breath-found to have severe acute hypoxemic respiratory failure requiring 15 L of HFNC in the setting of COVID-19 pneumonia. CTA no PE, LE dopplers no DVT. Patient requesting comfort care as of 09/18/20   OT comments  Upon arrival, pt sleeping in semi upright position in bed (received Klonopin prior to session); SpO2 98% on 1.5, RR 19, HR 95. Once awake, pt RR increasing to 30 bpm. Pt more agreeable to participate in therapy this session and enjoyed listening to rock and roll music. Pt washing his face at sink with Min Guard-Min A for balance. Pt agreeable to mobility to/from door. Performing mobility with Min A +2 and 2L O2. Continue to recommend dc to SNF and will continue to follow acutely as admitted.    Follow Up Recommendations  SNF;Supervision/Assistance - 24 hour (Possibly HHOT with 24/7 support)    Equipment Recommendations  3 in 1 bedside commode;Tub/shower seat    Recommendations for Other Services PT consult    Precautions / Restrictions Precautions Precautions: Fall       Mobility Bed Mobility Overal bed mobility: Needs Assistance Bed Mobility: Supine to Sit;Sit to Supine     Supine to sit: Mod assist Sit to supine: Min assist   General bed mobility comments: HOB elevated, assist to raise torso; on return assist to raise legs  Transfers Overall transfer level: Needs assistance Equipment used: 2 person hand held assist Transfers: Sit to/from Stand Sit to Stand: Min assist;+2 physical assistance         General transfer comment: from EOB, pt moving slowly with reports of back pain    Balance Overall balance assessment: Needs assistance Sitting-balance support: No upper  extremity supported;Feet supported Sitting balance-Leahy Scale: Fair     Standing balance support: Bilateral upper extremity supported Standing balance-Leahy Scale: Poor Standing balance comment: stood at sink ~5 minutes working on upright posture and manual therapy to reduce back pain; wt-shifting                            ADL either performed or assessed with clinical judgement   ADL Overall ADL's : Needs assistance/impaired     Grooming: Wash/dry face;Min guard;Minimal assistance;Standing Grooming Details (indicate cue type and reason): Min Guard-Min A for standing balance at sink to wash face                 Toilet Transfer: Minimal assistance;Ambulation (simulated in room)           Functional mobility during ADLs: Minimal assistance;+2 for physical assistance;+2 for safety/equipment General ADL Comments: Pt continues to present with decreased activity tolerance and cognition. Pt given anxiety medication prior to session. Noting improved RR     Vision       Perception     Praxis      Cognition Arousal/Alertness: Lethargic;Suspect due to medications (MD recommended pre-medicate w/ Klonopin) Behavior During Therapy: Anxious Overall Cognitive Status: Within Functional Limits for tasks assessed                                 General Comments: drowsy from meds  Exercises     Shoulder Instructions       General Comments  on arrival with SpO2 98% on 1.5, RR 19, HR 95 - woke up and instantly his RR increased to 30 bpm. Requiring 2L O2 for mobility/activity. 86-89%, RR 30-56, HR max 131.     Pertinent Vitals/ Pain       Pain Assessment: Faces Faces Pain Scale: Hurts whole lot Pain Location: back Pain Descriptors / Indicators: Discomfort;Grimacing Pain Intervention(s): Monitored during session;Limited activity within patient's tolerance;Repositioned  Home Living                                           Prior Functioning/Environment              Frequency  Min 2X/week        Progress Toward Goals  OT Goals(current goals can now be found in the care plan section)  Progress towards OT goals: Progressing toward goals  Acute Rehab OT Goals Patient Stated Goal: did not state OT Goal Formulation: With patient Time For Goal Achievement: 10/03/20 Potential to Achieve Goals: Good ADL Goals Pt Will Perform Upper Body Dressing: with set-up;with supervision;sitting Pt Will Perform Lower Body Dressing: with min guard assist;sit to/from stand Pt Will Transfer to Toilet: with min guard assist;bedside commode;ambulating Pt Will Perform Toileting - Clothing Manipulation and hygiene: with min guard assist;sit to/from stand;sitting/lateral leans Additional ADL Goal #1: Pt will independently verbalize three energy conservation techniques for ADLs and IADLs Additional ADL Goal #2: Pt will independently monitor SpO2 and use purse lip breathing for ADLs  Plan Discharge plan remains appropriate    Co-evaluation    PT/OT/SLP Co-Evaluation/Treatment: Yes Reason for Co-Treatment: For patient/therapist safety;To address functional/ADL transfers;Complexity of the patient's impairments (multi-system involvement) PT goals addressed during session: Mobility/safety with mobility;Balance OT goals addressed during session: ADL's and self-care      AM-PAC OT "6 Clicks" Daily Activity     Outcome Measure   Help from another person eating meals?: A Little Help from another person taking care of personal grooming?: A Little Help from another person toileting, which includes using toliet, bedpan, or urinal?: A Lot Help from another person bathing (including washing, rinsing, drying)?: A Lot Help from another person to put on and taking off regular upper body clothing?: A Little Help from another person to put on and taking off regular lower body clothing?: A Lot 6 Click Score: 15    End of Session  Equipment Utilized During Treatment: Oxygen  OT Visit Diagnosis: Unsteadiness on feet (R26.81);Other abnormalities of gait and mobility (R26.89);Muscle weakness (generalized) (M62.81);Pain Pain - part of body:  (Back)   Activity Tolerance Patient limited by fatigue   Patient Left in chair;with call bell/phone within reach   Nurse Communication Mobility status        Time: 4315-4008 OT Time Calculation (min): 38 min  Charges: OT General Charges $OT Visit: 1 Visit OT Treatments $Therapeutic Activity: 23-37 mins  Wyley Hack MSOT, OTR/L Acute Rehab Pager: [phone] Office: [phone]   Theodoro Grist Mattew Chriswell 09/21/2020, 3:33 PM

## 2020-09-21 NOTE — Progress Notes (Signed)
Physical Therapy Treatment Patient Details Name: Larry Olsen MRN: [id number] DOB: 02-03-1941 Today's Date: 09/21/2020    History of Present Illness 79 y.o. male with PMHx of HTN, HLD-known Covid 19+ approximately 2 weeks prior to this hospital stay-presenting with severe shortness of breath-found to have severe acute hypoxemic respiratory failure requiring 15 L of HFNC in the setting of COVID-19 pneumonia. CTA no PE, LE dopplers no DVT. Patient requesting comfort care as of 09/18/20    PT Comments    Per MD suggestion, patient given Klonopin for anxiety to try to reduce RR during activity. Patient sleeping on arrival with SpO2 98% on 1.5, RR 19, HR 95 - woke up and instantly his RR increased to 30 bpm. Throughout session, his RR varied 30-56. Noted when pt is distracted and talking about rock and roll music, he tends to breath more slowly (mid to low 30s). However, when he focuses on his breathing and trying to slow it down, he typically increases his rate. Patient was able to ambulate a short distance today on 2L with  sats 86-89%, RR 30-56, HRmax 131.    Follow Up Recommendations  SNF;Supervision/Assistance - 24 hour (consider HH if family can provide 24/7 assist)     Equipment Recommendations  Rolling walker with 5" wheels    Recommendations for Other Services       Precautions / Restrictions Precautions Precautions: Fall    Mobility  Bed Mobility Overal bed mobility: Needs Assistance Bed Mobility: Supine to Sit;Sit to Supine     Supine to sit: Mod assist Sit to supine: Min assist   General bed mobility comments: HOB elevated, assist to raise torso; on return assist to raise legs  Transfers Overall transfer level: Needs assistance Equipment used: 2 person hand held assist Transfers: Sit to/from Stand Sit to Stand: Min assist;+2 physical assistance         General transfer comment: from EOB, pt moving slowly with reports of back  pain  Ambulation/Gait Ambulation/Gait assistance: Min assist;+2 physical assistance;+2 safety/equipment Gait Distance (Feet): 5 Feet (standing rest at sink; 20, stand rest, 10) Assistive device: 2 person hand held assist Gait Pattern/deviations: Step-through pattern;Decreased stride length;Drifts right/left     General Gait Details: drifting left, right requiring 2 person assist (also due to numerous lines)   Stairs             Wheelchair Mobility    Modified Rankin (Stroke Patients Only)       Balance Overall balance assessment: Needs assistance Sitting-balance support: No upper extremity supported;Feet supported Sitting balance-Leahy Scale: Fair     Standing balance support: Bilateral upper extremity supported Standing balance-Leahy Scale: Poor Standing balance comment: stood at sink ~5 minutes working on upright posture and manual therapy to reduce back pain; wt-shifting                             Cognition Arousal/Alertness: Lethargic;Suspect due to medications (MD recommended pre-medicate w/ Klonopin) Behavior During Therapy: Anxious Overall Cognitive Status: Within Functional Limits for tasks assessed                                 General Comments: drowsy from meds      Exercises      General Comments        Pertinent Vitals/Pain Pain Assessment: Faces Faces Pain Scale: Hurts whole lot Pain Location: back Pain  Descriptors / Indicators: Discomfort;Grimacing Pain Intervention(s): Limited activity within patient's tolerance;Monitored during session;Other (comment) (manual therapy)    Home Living                      Prior Function            PT Goals (current goals can now be found in the care plan section) Acute Rehab PT Goals Patient Stated Goal: did not state Time For Goal Achievement: 10/03/20 Potential to Achieve Goals: Fair Progress towards PT goals: Progressing toward goals    Frequency    Min  3X/week      PT Plan Current plan remains appropriate    Co-evaluation PT/OT/SLP Co-Evaluation/Treatment: Yes Reason for Co-Treatment: Complexity of the patient's impairments (multi-system involvement);Necessary to address cognition/behavior during functional activity;For patient/therapist safety;To address functional/ADL transfers PT goals addressed during session: Mobility/safety with mobility;Balance        AM-PAC PT "6 Clicks" Mobility   Outcome Measure  Help needed turning from your back to your side while in a flat bed without using bedrails?: A Little Help needed moving from lying on your back to sitting on the side of a flat bed without using bedrails?: A Little Help needed moving to and from a bed to a chair (including a wheelchair)?: A Little Help needed standing up from a chair using your arms (e.g., wheelchair or bedside chair)?: A Little Help needed to walk in hospital room?: Total Help needed climbing 3-5 steps with a railing? : Total 6 Click Score: 14    End of Session Equipment Utilized During Treatment: Oxygen Activity Tolerance: Treatment limited secondary to medical complications (Comment);Patient limited by fatigue (incr RR;  incr anxiety) Patient left: with call bell/phone within reach;in bed   PT Visit Diagnosis: Muscle weakness (generalized) (M62.81);Difficulty in walking, not elsewhere classified (R26.2)     Time: 6546-5035 PT Time Calculation (min) (ACUTE ONLY): 35 min  Charges:  $Gait Training: 8-22 mins                      Jerolyn Center, PT Pager [phone]    Zena Amos 09/21/2020, 3:00 PM

## 2020-09-21 NOTE — TOC Progression Note (Signed)
Transition of Care [facility]) - Progression Note    Patient Details  Name: Larry Olsen MRN: [id number] Date of Birth: 04-20-1941  [facility] CM/SW Contact  [place] Pink, Student-Social Work Phone Number: 09/21/2020, 2:05 PM  Clinical Narrative:    CSW spoke with patient, he is in agreement to go to SNF and [facility]. Additionally, CSW intern spoke with patient's daughter and she is also in agreement that SNF is the best option for patient. CSW intern informed her that patient will potentially discharge Saturday but that this is susceptible to change. Patient's daughter indicated that she intends to bring patient comfortable clothes to [facility] and is glad her father will be discharging soon. CSW awaiting insurance auth.     Barriers to Discharge: Continued Medical Work up, English as a second language teacher, SNF Pending bed offer  Expected Discharge Plan and Services   In-house Referral: Clinical Social Work     Living arrangements for the past 2 months: Single Family Home                                       Social Determinants of Health (SDOH) Interventions    Readmission Risk Interventions No flowsheet data found.

## 2020-09-22 LAB — CBC
HCT: 40.1 % (ref 39.0–52.0)
Hemoglobin: 13.5 g/dL (ref 13.0–17.0)
MCH: 31.6 pg (ref 26.0–34.0)
MCHC: 33.7 g/dL (ref 30.0–36.0)
MCV: 93.9 fL (ref 80.0–100.0)
Platelets: 255 10*3/uL (ref 150–400)
RBC: 4.27 MIL/uL (ref 4.22–5.81)
RDW: 12.7 % (ref 11.5–15.5)
WBC: 27.1 10*3/uL — ABNORMAL HIGH (ref 4.0–10.5)
nRBC: 0 % (ref 0.0–0.2)

## 2020-09-22 LAB — COMPREHENSIVE METABOLIC PANEL
ALT: 127 U/L — ABNORMAL HIGH (ref 0–44)
AST: 88 U/L — ABNORMAL HIGH (ref 15–41)
Albumin: 2.7 g/dL — ABNORMAL LOW (ref 3.5–5.0)
Alkaline Phosphatase: 116 U/L (ref 38–126)
Anion gap: 10 (ref 5–15)
BUN: 44 mg/dL — ABNORMAL HIGH (ref 8–23)
CO2: 25 mmol/L (ref 22–32)
Calcium: 8.8 mg/dL — ABNORMAL LOW (ref 8.9–10.3)
Chloride: 98 mmol/L (ref 98–111)
Creatinine, Ser: 1.6 mg/dL — ABNORMAL HIGH (ref 0.61–1.24)
GFR, Estimated: 43 mL/min — ABNORMAL LOW (ref >60)
Glucose, Bld: 157 mg/dL — ABNORMAL HIGH (ref 70–99)
Potassium: 4.6 mmol/L (ref 3.5–5.1)
Sodium: 133 mmol/L — ABNORMAL LOW (ref 135–145)
Total Bilirubin: 1 mg/dL (ref 0.3–1.2)
Total Protein: 5.3 g/dL — ABNORMAL LOW (ref 6.5–8.1)

## 2020-09-22 LAB — C-REACTIVE PROTEIN: CRP: 0.5 mg/dL (ref <1.0)

## 2020-09-22 LAB — D-DIMER, QUANTITATIVE: D-Dimer, Quant: 0.47 ug/mL-FEU (ref 0.00–0.50)

## 2020-09-22 MED ORDER — FUROSEMIDE 40 MG PO TABS
40.0000 mg | ORAL_TABLET | Freq: Every day | ORAL | Status: DC
  Administered 2020-09-23 – 2020-09-24 (×2): 40 mg via ORAL
  Filled 2020-09-22 (×2): qty 1

## 2020-09-22 NOTE — Progress Notes (Signed)
PROGRESS NOTE                                                                                                                                                                                                             Patient Demographics:    Larry Olsen, is a 79 y.o. male, DOB - 22-Nov-1940, ZOX:[id number]  Outpatient Primary MD for the patient is Patient, No Pcp Per   Admit date - 09/12/2020   LOS - 10  Chief Complaint  Patient presents with   Shortness of Breath   Covid Positive       Brief Narrative: Patient is a 79 y.o. male with PMHx of HTN, HLD-known Covid 19+ approximately 2 weeks prior to this hospital stay-presenting with severe shortness of breath-found to have severe acute hypoxemic respiratory failure requiring 15 L of HFNC in the setting of COVID-19 pneumonia.  He has improved significantly with COVID-19 therapeutic agents-he still is very symptomatic with minimal ambulation-see below for further details.  COVID-19 vaccinated status: Unvaccinated  Significant Events: 10/26>> Admit to [facility] for severe hypoxia due to COVID-19 pneumonia, AKI and hyponatremia.  Significant studies: 10/26>>Chest x-ray: Diffuse interstitial airspace opacities 10/26>> CTA chest: No PE, multifocal pneumonia 10/27>> bilateral lower extremity Doppler: No DVT 10/27>> Echo: EF 55-60%, no wall motion abnormalities, small pericardial effusion  COVID-19 medications: Steroids: 10/26>> Remdesivir: 10/26>> 10/30 Actemra: 10/26 x 1  Antibiotics: None  Microbiology data: 10/26 >>blood culture: No growth  Procedures: None  Consults: Palliative care  DVT prophylaxis: Prophylactic Lovenox   Subjective:   Slow improvement continues-at rest he is only requiring around 2 L of oxygen-main issue continues to be severe tachypnea/significant symptoms with minimal ambulation (just getting from bed to the sink)   Assessment  &  Plan :   Acute Hypoxic Resp Failure due to Covid 19 Viral pneumonia: Had severe hypoxemia-was on heated high flow and NRB during the early part of the hospital stay-he has significantly improved-and is now just down to 2 L of oxygen.  Although significantly improved-he is very symptomatic with minimal ambulation-just getting out of bed and walking to the sink provokes significant amount of tachypnea and anxiety.  He was given Lasix yesterday-slight bump in creatinine today-volume status is better-hold Lasix today.  Continue Klonopin as needed for anxiety.  Suspect significant amount of debility/deconditioning-ongoing hypoxia/anxiety playing a role  in his symptoms with ambulation-and needs ongoing PT/OT therapy for a few more days before he can be considered stable for discharge to SNF.  Continue supportive care-continue tapering steroids-as noted above-volume status is stable today-does not require diuretics.  Fever: afebrile O2 requirements:  SpO2: 92 % O2 Flow Rate (L/min): 2 L/min FiO2 (%): (S) 40 %   COVID-19 Labs: Recent Labs    09/20/20 0103 09/21/20 0206 09/22/20 0051  DDIMER 0.65* 0.57* 0.47  CRP 0.6 0.6 0.5       Component Value Date/Time   BNP 240.6 (H) 09/12/2020 1342    No results for input(s): PROCALCITON in the last 168 hours.  Lab Results  Component Value Date   SARSCOV2NAA POSITIVE (A) 09/12/2020    Prone/Incentive Spirometry: encouraged patient to lie prone for 3-4 hours at a time for a total of 16 hours a day, and to encourage incentive spirometry use 3-4/hour.  Leukocytosis: Secondary to steroids-no indication for bacterial infection.  WBC count slowly trending down.  Elevated D-dimer: Secondary to COVID-19-CTA chest negative for PE/lower extremity Dopplers negative for DVT-was on intermediate dosing of Lovenox-has been changed to daily dosing.  AKI: Likely hemodynamically mediated-overall improved but creatinine seems to have plateaued around 1.5-1.6 range.   Supportive care continues-avoid nephrotoxic agents.  Hyponatremia: Secondary to hypovolemia/dehydration-improved after gentle hydration.  Volume status remains stable.  Transaminitis: Secondary to COVID-19-appears mild-stable for close monitoring/follow-up.  Troponin elevation: Trend is flat-not consistent with ACS-likely demand ischemia due to severe hypoxemia/AKI-echo with preserved EF-no wall motion abnormality.  Doubt further work-up required while inpatient.  HTN: BP stable-continue amlodipine. Continue to hold losartan due to AKI.   HLD: Cautiously continue with statin-watch LFTs.  Palliative care discussion: DNR in place-multiple discussions with the patient and daughter over the past few days.  He does not desire aggressive care-he actually had indicated that he wanted to be transitioned to comfort measures but given clinical improvement-agrees that we ought to continue with current measures including PT/OT.  GI prophylaxis: PPI  ABG: No results found for: PHART, PCO2ART, PO2ART, HCO3, TCO2, ACIDBASEDEF, O2SAT  Vent Settings: N/A    Condition -stable.  Family Communication  : Daughter Maureen Ralphs [phone]) over the phone on 11/5  Code Status:DNR  Diet :  Diet Order            Diet Heart Room service appropriate? Yes; Fluid consistency: Thin  Diet effective now                  Disposition Plan  :   Status is: Inpatient  Remains inpatient appropriate because:Inpatient level of care appropriate due to severity of illness   Dispo: The patient is from: Home              Anticipated d/c is to: SNF              Anticipated d/c date is: > 1-2 days              Patient currently is not medically stable to d/c.   Barriers to discharge: Hypoxia requiring O2 supplementation/severe exertional dyspnea with minimal ambulation-respiratory rate at times in the 40s/50s.  Antimicorbials  :    Anti-infectives (From admission, onward)   Start     Dose/Rate Route Frequency  Ordered Stop   09/13/20 1000  remdesivir 100 mg in sodium chloride 0.9 % 100 mL IVPB       "Followed by" Linked Group Details   100 mg 200 mL/hr over 30 Minutes Intravenous  Daily 09/12/20 1509 09/16/20 0823   09/12/20 1630  remdesivir 200 mg in sodium chloride 0.9% 250 mL IVPB       "Followed by" Linked Group Details   200 mg 580 mL/hr over 30 Minutes Intravenous Once 09/12/20 1509 09/13/20 0915      Inpatient Medications  Scheduled Meds:  amLODipine  2.5 mg Oral Daily   atorvastatin  10 mg Oral Daily   enoxaparin (LOVENOX) injection  40 mg Subcutaneous Q24H   feeding supplement  237 mL Oral BID BM   [START ON 09/23/2020] furosemide  40 mg Oral Daily   methylPREDNISolone (SOLU-MEDROL) injection  40 mg Intravenous Q12H   pantoprazole  40 mg Oral Q1200   sodium chloride flush  3 mL Intravenous Q12H   Continuous Infusions:  PRN Meds:.acetaminophen, albuterol, antiseptic oral rinse, chlorpheniramine-HYDROcodone, clonazePAM, guaiFENesin-dextromethorphan, ondansetron (ZOFRAN) IV, oxymetazoline, polyethylene glycol, sodium chloride   Time Spent in minutes  25   See all Orders from today for further details   Jeoffrey MassedShanker Dawson Hollman M.D on 09/22/2020 at 12:56 PM  To page go to [email or web address] - use universal password  [facility] -  Office  [phone]    Objective:   Vitals:   09/22/20 0415 09/22/20 0742 09/22/20 1215 09/22/20 1226  BP: 137/80 140/80 135/84   Pulse: 79 84 100   Resp: 20 17 20    Temp: 98.1 F (36.7 C) 98.4 F (36.9 C) 98.5 F (36.9 C)   TempSrc: Oral Oral Oral   SpO2: 100% 95% 91% 92%  Weight:      Height:        Wt Readings from Last 3 Encounters:  09/12/20 65.8 kg     Intake/Output Summary (Last 24 hours) at 09/22/2020 1256 Last data filed at 09/22/2020 1020 Gross per 24 hour  Intake 480 ml  Output 950 ml  Net -470 ml     Physical Exam Gen Exam:Alert awake-not in any distress HEENT:atraumatic, normocephalic Chest: B/L clear to  auscultation anteriorly CVS:S1S2 regular Abdomen:soft non tender, non distended Extremities:trace edema Neurology: Non focal Skin: no rash   Data Review:    CBC Recent Labs  Lab 09/16/20 0202 09/16/20 0202 09/17/20 0347 09/17/20 0347 09/18/20 0438 09/19/20 0208 09/20/20 0103 09/21/20 0206 09/22/20 0051  WBC 27.2*   < > 30.0*   < > 46.9* 32.2* 27.5* 25.8* 27.1*  HGB 13.7   < > 14.0   < > 13.8 13.0 13.3 13.4 13.5  HCT 39.7   < > 40.2   < > 39.1 38.1* 38.9* 39.1 40.1  PLT 352   < > 356   < > 333 293 277 264 255  MCV 89.0   < > 89.5   < > 90.9 91.4 93.1 92.0 93.9  MCH 30.7   < > 31.2   < > 32.1 31.2 31.8 31.5 31.6  MCHC 34.5   < > 34.8   < > 35.3 34.1 34.2 34.3 33.7  RDW 12.9   < > 12.9   < > 13.2 12.8 12.7 12.6 12.7  LYMPHSABS 0.5*  --  0.5*  --   --   --   --   --   --   MONOABS 0.8  --  0.8  --   --   --   --   --   --   EOSABS 0.0  --  0.0  --   --   --   --   --   --   BASOSABS 0.0  --  0.1  --   --   --   --   --   --    < > = values in this interval not displayed.    Chemistries  Recent Labs  Lab 09/18/20 0438 09/19/20 0208 09/20/20 0103 09/21/20 0206 09/22/20 0051  NA 131* 133* 133* 132* 133*  K 4.8 4.4 4.6 5.2* 4.6  CL 98 97* 98 99 98  CO2 25 25 25 25 25   GLUCOSE 171* 161* 155* 152* 157*  BUN 38* 40* 47* 43* 44*  CREATININE 1.52* 1.43* 1.51* 1.46* 1.60*  CALCIUM 8.7* 8.6* 8.4* 8.6* 8.8*  AST 68* 43* 42* 71* 88*  ALT 71* 55* 54* 89* 127*  ALKPHOS 121 106 102 104 116  BILITOT 1.1 0.7 1.1 0.9 1.0   ------------------------------------------------------------------------------------------------------------------ No results for input(s): CHOL, HDL, LDLCALC, TRIG, CHOLHDL, LDLDIRECT in the last 72 hours.  No results found for: HGBA1C ------------------------------------------------------------------------------------------------------------------ No results for input(s): TSH, T4TOTAL, T3FREE, THYROIDAB in the last 72 hours.  Invalid input(s):  FREET3 ------------------------------------------------------------------------------------------------------------------ No results for input(s): VITAMINB12, FOLATE, FERRITIN, TIBC, IRON, RETICCTPCT in the last 72 hours.  Coagulation profile No results for input(s): INR, PROTIME in the last 168 hours.  Recent Labs    09/21/20 0206 09/22/20 0051  DDIMER 0.57* 0.47    Cardiac Enzymes No results for input(s): CKMB, TROPONINI, MYOGLOBIN in the last 168 hours.  Invalid input(s): CK ------------------------------------------------------------------------------------------------------------------    Component Value Date/Time   BNP 240.6 (H) 09/12/2020 1342    Micro Results Recent Results (from the past 240 hour(s))  Blood Culture (routine x 2)     Status: None   Collection Time: 09/12/20  2:44 PM   Specimen: BLOOD LEFT ARM  Result Value Ref Range Status   Specimen Description BLOOD LEFT ARM  Final   Special Requests   Final    BOTTLES DRAWN AEROBIC AND ANAEROBIC Blood Culture results may not be optimal due to an inadequate volume of blood received in culture bottles   Culture   Final    NO GROWTH 5 DAYS Performed at [facility], [address] [place], [place]    Report Status 09/17/2020 FINAL  Final  Blood Culture (routine x 2)     Status: None   Collection Time: 09/12/20  3:00 PM   Specimen: BLOOD RIGHT ARM  Result Value Ref Range Status   Specimen Description BLOOD RIGHT ARM  Final   Special Requests   Final    BOTTLES DRAWN AEROBIC AND ANAEROBIC Blood Culture adequate volume   Culture   Final    NO GROWTH 5 DAYS Performed at [facility], [address], [place], [place]    Report Status 09/17/2020 FINAL  Final  Respiratory Panel by RT PCR (Flu A&B, Covid) - Nasopharyngeal Swab     Status: Abnormal   Collection Time: 09/12/20  3:08 PM   Specimen: Nasopharyngeal Swab  Result Value Ref Range Status   SARS Coronavirus 2 by RT PCR  POSITIVE (A) NEGATIVE Final    Comment: emailed L. Berdik RN 17:00 09/12/20 ([email or web address]) (NOTE) SARS-CoV-2 target nucleic acids are DETECTED.  SARS-CoV-2 RNA is generally detectable in upper respiratory specimens  during the acute phase of infection. Positive results are indicative of the presence of the identified virus, but do not rule out bacterial infection or co-infection with other pathogens not detected by the test. Clinical correlation with patient history and other diagnostic information is necessary to determine patient infection status.  The expected result is Negative.  Fact Sheet for Patients:  https://www.moore.com/  Fact Sheet for Healthcare Providers: https://www.young.biz/  This test is not yet approved or cleared by the [place] FDA and  has been authorized for detection and/or diagnosis of SARS-CoV-2 by FDA under an Emergency Use Authorization (EUA).  This EUA will remain in effect (meaning this test can be used) for the duration of  the COVID-19  declaration under Section 564(b)(1) of the Act, 21 U.S.C. section 360bbb-3(b)(1), unless the authorization is terminated or revoked sooner.      Influenza A by PCR NEGATIVE NEGATIVE Final   Influenza B by PCR NEGATIVE NEGATIVE Final    Comment: (NOTE) The Xpert Xpress SARS-CoV-2/FLU/RSV assay is intended as an aid in  the diagnosis of influenza from Nasopharyngeal swab specimens and  should not be used as a sole basis for treatment. Nasal washings and  aspirates are unacceptable for Xpert Xpress SARS-CoV-2/FLU/RSV  testing.  Fact Sheet for Patients: https://www.moore.com/  Fact Sheet for Healthcare Providers: https://www.young.biz/  This test is not yet approved or cleared by the [place] FDA and  has been authorized for detection and/or diagnosis of SARS-CoV-2 by  FDA under an Emergency Use Authorization (EUA). This EUA will  remain  in effect (meaning this test can be used) for the duration of the  Covid-19 declaration under Section 564(b)(1) of the Act, 21  U.S.C. section 360bbb-3(b)(1), unless the authorization is  terminated or revoked. Performed at [facility], [address] [place], [place] [address]     Radiology Reports CT Angio Chest PE W and/or Wo Contrast  Result Date: 09/12/2020 CLINICAL DATA:  79 year old male with elevated D-dimer. Concern for pulmonary embolism. EXAM: CT ANGIOGRAPHY CHEST WITH CONTRAST TECHNIQUE: Multidetector CT imaging of the chest was performed using the standard protocol during bolus administration of intravenous contrast. Multiplanar CT image reconstructions and MIPs were obtained to evaluate the vascular anatomy. CONTRAST:  75mL OMNIPAQUE IOHEXOL 350 MG/ML SOLN COMPARISON:  Chest radiograph dated 09/12/2020. FINDINGS: Evaluation of this exam is limited due to respiratory motion artifact. Cardiovascular: There is no cardiomegaly or pericardial effusion. There is 3 vessel coronary vascular calcification. Moderate atherosclerotic calcification of the thoracic aorta. No aneurysmal dilatation or dissection. The origins of the great vessels of the aortic arch appear patent as visualized. Evaluation of the pulmonary arteries is limited due to respiratory motion artifact as well as streak artifact caused by patient's arms. No large or central pulmonary artery embolus identified. Mediastinum/Nodes: No hilar or mediastinal adenopathy. The esophagus and the thyroid gland are grossly unremarkable. No mediastinal fluid collection. Lungs/Pleura: Extensive patchy bilateral pulmonary opacities with predominant involvement of the lower lobes most consistent with multifocal pneumonia, likely viral or atypical in etiology including COVID-19. Clinical correlation is recommended. There are small bilateral pleural effusions. No pneumothorax. The central airways are patent. Upper Abdomen: No acute  abnormality. Musculoskeletal: Degenerative changes of the spine. No acute osseous pathology. Review of the MIP images confirms the above findings. IMPRESSION: 1. No CT evidence of central pulmonary artery embolus. 2. Multifocal pneumonia, likely viral or atypical in etiology. Clinical correlation and follow-up to resolution recommended. 3. Small bilateral pleural effusions. 4. Aortic Atherosclerosis (ICD10-I70.0). Electronically Signed   By: Elgie Collard M.D.   On: 09/12/2020 19:05   DG Chest Port 1 View  Result Date: 09/12/2020 CLINICAL DATA:  Shortness of breath, COVID-19 positive EXAM: PORTABLE CHEST 1 VIEW COMPARISON:  10/07/2008 FINDINGS: Heart size within normal limits. Aortic atherosclerosis. There  are diffuse interstitial opacities throughout the left lung, most confluent within the left lung base. Mild interstitial prominence within the right lung. No large pleural fluid collection. No pneumothorax. IMPRESSION: Diffuse interstitial opacities throughout the left lung, most confluent within the left lung base. Findings concerning for multifocal atypical/viral infection versus asymmetric edema. Electronically Signed   By: Duanne Guess D.O.   On: 09/12/2020 14:59   DG Chest Port 1V same Day  Result Date: 09/15/2020 CLINICAL DATA:  Shortness of breath.  COVID-19 positive EXAM: PORTABLE CHEST 1 VIEW COMPARISON:  Chest radiograph and chest CT September 12, 2020 FINDINGS: There is patchy airspace opacity throughout the lungs bilaterally, similar to recent prior studies. No consolidation. Heart size and pulmonary vascularity within normal limits. No adenopathy. There is aortic atherosclerosis. No bone lesions IMPRESSION: Persistent multifocal airspace opacity consistent with atypical organism pneumonia. No appreciable new opacity compared to recent studies. Stable cardiac silhouette. Aortic Atherosclerosis (ICD10-I70.0). Electronically Signed   By: Bretta Bang III M.D.   On: 09/15/2020 07:43    ECHOCARDIOGRAM COMPLETE  Result Date: 09/14/2020    ECHOCARDIOGRAM REPORT   Patient Name:   HARRIS PENTON Date of Exam: 09/13/2020 Medical Rec #:  [id number]       Height:       65.0 in Accession #:    [id number]      Weight:       145.0 lb Date of Birth:  [birth year]       BSA:          1.725 m Patient Age:    79 years        BP:           141/76 mmHg Patient Gender: M               HR:           101 bpm. Exam Location:  Inpatient Procedure: 2D Echo, Cardiac Doppler and Color Doppler Indications:    Acute Respiratory Failure 518.82 / R06.89  History:        Patient has no prior history of Echocardiogram examinations.                 Risk Factors:Hypertension. Covid -19 Positive.  Sonographer:    Tiffany Dance Referring Phys: Jeoffrey Massed, M IMPRESSIONS  1. Left ventricular ejection fraction, by estimation, is 55 to 60%. The left ventricle has normal function. The left ventricle has no regional wall motion abnormalities. There is mild concentric left ventricular hypertrophy. Left ventricular diastolic parameters were normal.  2. Right ventricular systolic function is normal. The right ventricular size is normal. There is normal pulmonary artery systolic pressure. The estimated right ventricular systolic pressure is 24.5 mmHg.  3. A small pericardial effusion is present. There is no evidence of cardiac tamponade.  4. The mitral valve is normal in structure. Trivial mitral valve regurgitation. No evidence of mitral stenosis.  5. The aortic valve is tricuspid. There is mild calcification of the aortic valve. Aortic valve regurgitation is not visualized. Mild aortic valve sclerosis is present, with no evidence of aortic valve stenosis.  6. The inferior vena cava is normal in size with greater than 50% respiratory variability, suggesting right atrial pressure of 3 mmHg. Comparison(s): No prior Echocardiogram. Conclusion(s)/Recommendation(s): Otherwise normal echocardiogram, with minor abnormalities described  in the report. FINDINGS  Left Ventricle: Left ventricular ejection fraction, by estimation, is 55 to 60%. The left ventricle has normal function. The left ventricle has no regional wall motion  abnormalities. The left ventricular internal cavity size was normal in size. There is  mild concentric left ventricular hypertrophy. Left ventricular diastolic parameters were normal. Right Ventricle: The right ventricular size is normal. No increase in right ventricular wall thickness. Right ventricular systolic function is normal. There is normal pulmonary artery systolic pressure. The tricuspid regurgitant velocity is 2.32 m/s, and  with an assumed right atrial pressure of 3 mmHg, the estimated right ventricular systolic pressure is 24.5 mmHg. Left Atrium: Left atrial size was normal in size. Right Atrium: Right atrial size was normal in size. Pericardium: A small pericardial effusion is present. There is no evidence of cardiac tamponade. Mitral Valve: The mitral valve is normal in structure. Trivial mitral valve regurgitation. No evidence of mitral valve stenosis. Tricuspid Valve: The tricuspid valve is normal in structure. Tricuspid valve regurgitation is trivial. No evidence of tricuspid stenosis. Aortic Valve: The aortic valve is tricuspid. There is mild calcification of the aortic valve. Aortic valve regurgitation is not visualized. Mild aortic valve sclerosis is present, with no evidence of aortic valve stenosis. Pulmonic Valve: The pulmonic valve was not well visualized. Pulmonic valve regurgitation is not visualized. No evidence of pulmonic stenosis. Aorta: The aortic root, ascending aorta and aortic arch are all structurally normal, with no evidence of dilitation or obstruction. Venous: The inferior vena cava is normal in size with greater than 50% respiratory variability, suggesting right atrial pressure of 3 mmHg. IAS/Shunts: No atrial level shunt detected by color flow Doppler.  LEFT VENTRICLE PLAX 2D LVIDd:          4.00 cm LVIDs:         2.77 cm LV PW:         1.24 cm LV IVS:        1.16 cm LVOT diam:     2.00 cm LV SV:         51 LV SV Index:   30 LVOT Area:     3.14 cm  RIGHT VENTRICLE          IVC RV Basal diam:  2.58 cm  IVC diam: 1.42 cm TAPSE (M-mode): 2.4 cm LEFT ATRIUM             Index       RIGHT ATRIUM           Index LA diam:        3.30 cm 1.91 cm/m  RA Area:     12.00 cm LA Vol (A2C):   41.1 ml 23.82 ml/m RA Volume:   23.40 ml  13.56 ml/m LA Vol (A4C):   34.8 ml 20.17 ml/m LA Biplane Vol: 40.1 ml 23.24 ml/m  AORTIC VALVE LVOT Vmax:   92.15 cm/s LVOT Vmean:  59.400 cm/s LVOT VTI:    0.163 m  AORTA Ao Root diam: 3.50 cm Ao Asc diam:  3.60 cm MITRAL VALVE               TRICUSPID VALVE MV Area (PHT): 2.76 cm    TR Peak grad:   21.5 mmHg MV Decel Time: 275 msec    TR Vmax:        232.00 cm/s MV E velocity: 60.60 cm/s MV A velocity: 85.70 cm/s  SHUNTS MV E/A ratio:  0.71        Systemic VTI:  0.16 m  Systemic Diam: 2.00 cm Jodelle Red MD Electronically signed by Jodelle Red MD Signature Date/Time: 09/14/2020/8:03:57 AM    Final    VAS [place] LOWER EXTREMITY VENOUS (DVT)  Result Date: 09/13/2020  Lower Venous DVTStudy Indications: Elevated Ddimer.  Risk Factors: COVID 19 positive. Comparison Study: No prior studies. Performing Technologist: Chanda Busing RVT  Examination Guidelines: A complete evaluation includes B-mode imaging, spectral Doppler, color Doppler, and power Doppler as needed of all accessible portions of each vessel. Bilateral testing is considered an integral part of a complete examination. Limited examinations for reoccurring indications may be performed as noted. The reflux portion of the exam is performed with the patient in reverse Trendelenburg.  +---------+---------------+---------+-----------+----------+--------------+  RIGHT     Compressibility Phasicity Spontaneity Properties Thrombus Aging   +---------+---------------+---------+-----------+----------+--------------+  CFV       Full            Yes       Yes                                    +---------+---------------+---------+-----------+----------+--------------+  SFJ       Full                                                             +---------+---------------+---------+-----------+----------+--------------+  FV Prox   Full                                                             +---------+---------------+---------+-----------+----------+--------------+  FV Mid    Full                                                             +---------+---------------+---------+-----------+----------+--------------+  FV Distal Full                                                             +---------+---------------+---------+-----------+----------+--------------+  PFV       Full                                                             +---------+---------------+---------+-----------+----------+--------------+  POP       Full            Yes       Yes                                    +---------+---------------+---------+-----------+----------+--------------+  PTV       Full                                                             +---------+---------------+---------+-----------+----------+--------------+  PERO      Full                                                             +---------+---------------+---------+-----------+----------+--------------+   +---------+---------------+---------+-----------+----------+--------------+  LEFT      Compressibility Phasicity Spontaneity Properties Thrombus Aging  +---------+---------------+---------+-----------+----------+--------------+  CFV       Full            Yes       Yes                                    +---------+---------------+---------+-----------+----------+--------------+  SFJ       Full                                                              +---------+---------------+---------+-----------+----------+--------------+  FV Prox   Full                                                             +---------+---------------+---------+-----------+----------+--------------+  FV Mid    Full                                                             +---------+---------------+---------+-----------+----------+--------------+  FV Distal Full                                                             +---------+---------------+---------+-----------+----------+--------------+  PFV       Full                                                             +---------+---------------+---------+-----------+----------+--------------+  POP       Full            Yes       Yes                                    +---------+---------------+---------+-----------+----------+--------------+  PTV       Full                                                             +---------+---------------+---------+-----------+----------+--------------+  PERO      Full                                                             +---------+---------------+---------+-----------+----------+--------------+     Summary: RIGHT: - There is no evidence of deep vein thrombosis in the lower extremity.  - No cystic structure found in the popliteal fossa.  LEFT: - There is no evidence of deep vein thrombosis in the lower extremity.  - No cystic structure found in the popliteal fossa.  *See table(s) above for measurements and observations. Electronically signed by Coral Else MD on 09/13/2020 at 8:53:51 PM.    Final

## 2020-09-22 NOTE — Progress Notes (Signed)
Physical Therapy Treatment Patient Details Name: Larry Olsen MRN: [id number] DOB: 1941-01-20 Today's Date: 09/22/2020    History of Present Illness 79 y.o. male with PMHx of HTN, HLD-known Covid 19+ approximately 2 weeks prior to this hospital stay-presenting with severe shortness of breath-found to have severe acute hypoxemic respiratory failure requiring 15 L of HFNC in the setting of COVID-19 pneumonia. CTA no PE, LE dopplers no DVT. Patient requesting comfort care as of 09/18/20    PT Comments    Pt required min assist bed mobility, min guard assist sitting EOB, min assist sit to stand, and min assist SPT. Pt declining up to recliner due to back pain. SpO2 in 90s at rest on 3L. Desat to 89% sitting EOB, RR 26. Desat to 85%, RR 50, sitting EOB. Cues for deep breathing to recover. Pt supine in bed at end of session. Pt grateful for therapy intervention.    Follow Up Recommendations  SNF;Supervision/Assistance - 24 hour (consider HH if family can provide 24-hour assist)     Equipment Recommendations  Rolling walker with 5" wheels    Recommendations for Other Services       Precautions / Restrictions Precautions Precautions: Fall    Mobility  Bed Mobility Overal bed mobility: Needs Assistance Bed Mobility: Supine to Sit;Sit to Supine     Supine to sit: Min assist;HOB elevated Sit to supine: Min assist;HOB elevated   General bed mobility comments: +rail, assist to elevate trunk, assist with BLE back to bed  Transfers Overall transfer level: Needs assistance Equipment used: 1 person hand held assist Transfers: Sit to/from Stand;Stand Pivot Transfers Sit to Stand: Min assist Stand pivot transfers: Min assist       General transfer comment: assist to power up and stabilize balance, pivot steps to/from [facility]  Ambulation/Gait                 Stairs             Wheelchair Mobility    Modified Rankin (Stroke Patients Only)       Balance Overall  balance assessment: Needs assistance Sitting-balance support: No upper extremity supported;Feet supported Sitting balance-Leahy Scale: Good       Standing balance-Leahy Scale: Poor Standing balance comment: reliant on external support                            Cognition Arousal/Alertness: Awake/alert Behavior During Therapy: WFL for tasks assessed/performed Overall Cognitive Status: Within Functional Limits for tasks assessed                                 General Comments: pre-medicated with Klonipin for anxiety      Exercises      General Comments General comments (skin integrity, edema, etc.): SpO2 95% on 3L at rest. Desat to 89% sitting EOB, RR 26. Desat to 85% (RR 50) sitting on BSC requiring cues for deep breathing. SpO2 90% upon return to bed.      Pertinent Vitals/Pain Pain Assessment: Faces Faces Pain Scale: Hurts little more Pain Location: back Pain Descriptors / Indicators: Discomfort;Grimacing Pain Intervention(s): Repositioned;Limited activity within patient's tolerance;Monitored during session    Home Living                      Prior Function            PT Goals (current  goals can now be found in the care plan section) Acute Rehab PT Goals Patient Stated Goal: home Progress towards PT goals: Progressing toward goals    Frequency    Min 3X/week      PT Plan Current plan remains appropriate    Co-evaluation              AM-PAC PT "6 Clicks" Mobility   Outcome Measure  Help needed turning from your back to your side while in a flat bed without using bedrails?: A Little Help needed moving from lying on your back to sitting on the side of a flat bed without using bedrails?: A Little Help needed moving to and from a bed to a chair (including a wheelchair)?: A Little Help needed standing up from a chair using your arms (e.g., wheelchair or bedside chair)?: A Little Help needed to walk in hospital room?: A  Lot Help needed climbing 3-5 steps with a railing? : Total 6 Click Score: 15    End of Session Equipment Utilized During Treatment: Oxygen Activity Tolerance: Patient tolerated treatment well Patient left: in bed;with call bell/phone within reach Nurse Communication: Mobility status PT Visit Diagnosis: Muscle weakness (generalized) (M62.81);Difficulty in walking, not elsewhere classified (R26.2)     Time: 6213-0865 PT Time Calculation (min) (ACUTE ONLY): 40 min  Charges:  $Gait Training: 8-22 mins $Therapeutic Activity: 23-37 mins                     Aida Raider, [place]  Office # [phone] Pager [phone]    Ilda Foil 09/22/2020, 1:54 PM

## 2020-09-23 LAB — COMPREHENSIVE METABOLIC PANEL
ALT: 107 U/L — ABNORMAL HIGH (ref 0–44)
AST: 64 U/L — ABNORMAL HIGH (ref 15–41)
Albumin: 2.6 g/dL — ABNORMAL LOW (ref 3.5–5.0)
Alkaline Phosphatase: 102 U/L (ref 38–126)
Anion gap: 8 (ref 5–15)
BUN: 46 mg/dL — ABNORMAL HIGH (ref 8–23)
CO2: 26 mmol/L (ref 22–32)
Calcium: 9 mg/dL (ref 8.9–10.3)
Chloride: 99 mmol/L (ref 98–111)
Creatinine, Ser: 1.55 mg/dL — ABNORMAL HIGH (ref 0.61–1.24)
GFR, Estimated: 45 mL/min — ABNORMAL LOW (ref >60)
Glucose, Bld: 209 mg/dL — ABNORMAL HIGH (ref 70–99)
Potassium: 5.1 mmol/L (ref 3.5–5.1)
Sodium: 133 mmol/L — ABNORMAL LOW (ref 135–145)
Total Bilirubin: 0.7 mg/dL (ref 0.3–1.2)
Total Protein: 5 g/dL — ABNORMAL LOW (ref 6.5–8.1)

## 2020-09-23 LAB — CBC
HCT: 38.6 % — ABNORMAL LOW (ref 39.0–52.0)
Hemoglobin: 12.9 g/dL — ABNORMAL LOW (ref 13.0–17.0)
MCH: 31.4 pg (ref 26.0–34.0)
MCHC: 33.4 g/dL (ref 30.0–36.0)
MCV: 93.9 fL (ref 80.0–100.0)
Platelets: 242 10*3/uL (ref 150–400)
RBC: 4.11 MIL/uL — ABNORMAL LOW (ref 4.22–5.81)
RDW: 12.6 % (ref 11.5–15.5)
WBC: 26.5 10*3/uL — ABNORMAL HIGH (ref 4.0–10.5)
nRBC: 0 % (ref 0.0–0.2)

## 2020-09-23 LAB — D-DIMER, QUANTITATIVE: D-Dimer, Quant: 0.52 ug/mL-FEU — ABNORMAL HIGH (ref 0.00–0.50)

## 2020-09-23 LAB — C-REACTIVE PROTEIN: CRP: 0.9 mg/dL (ref <1.0)

## 2020-09-23 NOTE — Progress Notes (Signed)
PROGRESS NOTE                                                                                                                                                                                                             Patient Demographics:    Larry Olsen, is a 79 y.o. male, DOB - Aug 18, 1941, UEA:[id number]  Outpatient Primary MD for the patient is Patient, No Pcp Per   Admit date - 09/12/2020   LOS - 11  Chief Complaint  Patient presents with  . Shortness of Breath  . Covid Positive       Brief Narrative: Patient is a 79 y.o. male with PMHx of HTN, HLD-known Covid 19+ approximately 2 weeks prior to this hospital stay-presenting with severe shortness of breath-found to have severe acute hypoxemic respiratory failure requiring 15 L of HFNC in the setting of COVID-19 pneumonia.  He has improved significantly with COVID-19 therapeutic agents-he still is very symptomatic with minimal ambulation-see below for further details.  COVID-19 vaccinated status: Unvaccinated  Significant Events: 10/26>> Admit to [facility] for severe hypoxia due to COVID-19 pneumonia, AKI and hyponatremia.  Significant studies: 10/26>>Chest x-ray: Diffuse interstitial airspace opacities 10/26>> CTA chest: No PE, multifocal pneumonia 10/27>> bilateral lower extremity Doppler: No DVT 10/27>> Echo: EF 55-60%, no wall motion abnormalities, small pericardial effusion  COVID-19 medications: Steroids: 10/26>> Remdesivir: 10/26>> 10/30 Actemra: 10/26 x 1  Antibiotics: None  Microbiology data: 10/26 >>blood culture: No growth  Procedures: None  Consults: Palliative care  DVT prophylaxis: Prophylactic Lovenox   Subjective:   Patient in bed, appears comfortable, denies any headache, no fever, no chest pain or pressure, no shortness of breath , no abdominal pain. No focal weakness.    Assessment  & Plan :   Acute Hypoxic Resp Failure due to  Covid 19 Viral pneumonia: Had severe hypoxemia-was on heated high flow and NRB during the early part of the hospital stay-he has significantly improved-and is now just down to 2 L of oxygen.  Although significantly improved-he is very symptomatic with minimal ambulation-just getting out of bed and walking to the sink provokes significant amount of tachypnea and anxiety.  He was given Lasix yesterday-slight bump in creatinine today-volume status is better-hold Lasix today.  Continue Klonopin as needed for anxiety.  Suspect significant amount of debility/deconditioning-ongoing hypoxia/anxiety playing a role in his symptoms  with ambulation-and needs ongoing PT/OT therapy for a few more days before he can be considered stable for discharge to SNF.  Continue supportive care-continue tapering steroids-as noted above-volume status is stable today-does not require diuretics.    O2 requirements:  SpO2: 95 % O2 Flow Rate (L/min): 2 L/min FiO2 (%): (S) 40 %    Prone/Incentive Spirometry: encouraged patient to lie prone for 3-4 hours at a time for a total of 16 hours a day, and to encourage incentive spirometry use 3-4/hour.    Recent Labs  Lab 09/19/20 0208 09/20/20 0103 09/21/20 0206 09/22/20 0051 09/23/20 0426  WBC 32.2* 27.5* 25.8* 27.1* 26.5*  HGB 13.0 13.3 13.4 13.5 12.9*  HCT 38.1* 38.9* 39.1 40.1 38.6*  PLT 293 277 264 255 242  CRP 0.7 0.6 0.6 0.5 0.9  DDIMER 0.68* 0.65* 0.57* 0.47 0.52*  AST 43* 42* 71* 88* 64*  ALT 55* 54* 89* 127* 107*  ALKPHOS 106 102 104 116 102  BILITOT 0.7 1.1 0.9 1.0 0.7  ALBUMIN 2.6* 2.5* 2.7* 2.7* 2.6*      Leukocytosis: Secondary to steroids-no indication for bacterial infection.  WBC count slowly trending down.  Elevated D-dimer: Secondary to COVID-19-CTA chest negative for PE/lower extremity Dopplers negative for DVT-was on intermediate dosing of Lovenox-has been changed to daily dosing.  AKI: Likely hemodynamically mediated-overall improved but  creatinine seems to have plateaued around 1.5-1.6 range.  Supportive care continues-avoid nephrotoxic agents.  Hyponatremia: Secondary to hypovolemia/dehydration-improved after gentle hydration.  Volume status remains stable.  Transaminitis: Secondary to COVID-19-appears mild-stable for close monitoring/follow-up.  Troponin elevation: Trend is flat-not consistent with ACS-likely demand ischemia due to severe hypoxemia/AKI-echo with preserved EF-no wall motion abnormality.  Doubt further work-up required while inpatient.  HTN: BP stable-continue amlodipine. Continue to hold losartan due to AKI.   HLD: Cautiously continue with statin-watch LFTs ( trend stable)   Palliative care discussion: DNR in place-multiple discussions with the patient and daughter over the past few days.  He does not desire aggressive care-he actually had indicated that he wanted to be transitioned to comfort measures but given clinical improvement-agrees that we ought to continue with current measures including PT/OT.  GI prophylaxis: PPI    Condition - stable.  Family Communication  : Daughter Maureen Ralphs [phone]) over the phone on 11/5  Code Status:DNR  Diet :  Diet Order            Diet Heart Room service appropriate? Yes; Fluid consistency: Thin  Diet effective now                  Disposition Plan  :   Status is: Inpatient  Remains inpatient appropriate because:Inpatient level of care appropriate due to severity of illness   Dispo: The patient is from: Home              Anticipated d/c is to: SNF              Anticipated d/c date is: > 1-2 days              Patient currently is not medically stable to d/c.   Barriers to discharge: Hypoxia requiring O2 supplementation/severe exertional dyspnea with minimal ambulation-respiratory rate at times in the 40s/50s.  Antimicorbials  :    Anti-infectives (From admission, onward)   Start     Dose/Rate Route Frequency Ordered Stop   09/13/20 1000   remdesivir 100 mg in sodium chloride 0.9 % 100 mL IVPB       "  Followed by" Linked Group Details   100 mg 200 mL/hr over 30 Minutes Intravenous Daily 09/12/20 1509 09/16/20 0823   09/12/20 1630  remdesivir 200 mg in sodium chloride 0.9% 250 mL IVPB       "Followed by" Linked Group Details   200 mg 580 mL/hr over 30 Minutes Intravenous Once 09/12/20 1509 09/13/20 0915      Inpatient Medications  Scheduled Meds: . amLODipine  2.5 mg Oral Daily  . atorvastatin  10 mg Oral Daily  . enoxaparin (LOVENOX) injection  40 mg Subcutaneous Q24H  . feeding supplement  237 mL Oral BID BM  . furosemide  40 mg Oral Daily  . methylPREDNISolone (SOLU-MEDROL) injection  40 mg Intravenous Q12H  . pantoprazole  40 mg Oral Q1200  . sodium chloride flush  3 mL Intravenous Q12H   Continuous Infusions:  PRN Meds:.acetaminophen, albuterol, antiseptic oral rinse, chlorpheniramine-HYDROcodone, clonazePAM, guaiFENesin-dextromethorphan, ondansetron (ZOFRAN) IV, oxymetazoline, polyethylene glycol, sodium chloride   Time Spent in minutes  25   See all Orders from today for further details   Susa Raring M.D on 09/23/2020 at 11:06 AM  To page go to [email or web address] - use universal password  [facility] -  Office  [phone]    Objective:   Vitals:   09/22/20 1226 09/22/20 1600 09/22/20 2000 09/23/20 0503  BP:  116/62 124/75 125/72  Pulse:  71 92 86  Resp:  18 20 (!) 21  Temp:  98.6 F (37 C) 98 F (36.7 C) 98.6 F (37 C)  TempSrc:  Oral Oral Oral  SpO2: 92% 96% 95% 95%  Weight:      Height:        Wt Readings from Last 3 Encounters:  09/12/20 65.8 kg     Intake/Output Summary (Last 24 hours) at 09/23/2020 1106 Last data filed at 09/23/2020 0900 Gross per 24 hour  Intake 480 ml  Output 600 ml  Net -120 ml     Physical Exam  Awake Alert, No new F.N deficits, Normal affect [place].AT,PERRAL Supple Neck,No JVD, No cervical lymphadenopathy appriciated.  Symmetrical Chest wall  movement, Good air movement bilaterally, CTAB RRR,No Gallops, Rubs or new Murmurs, No Parasternal Heave +ve B.Sounds, Abd Soft, No tenderness, No organomegaly appriciated, No rebound - guarding or rigidity. No Cyanosis, Clubbing or edema, No new Rash or bruise    Data Review:    CBC Recent Labs  Lab 09/17/20 0347 09/18/20 0438 09/19/20 0208 09/20/20 0103 09/21/20 0206 09/22/20 0051 09/23/20 0426  WBC 30.0*   < > 32.2* 27.5* 25.8* 27.1* 26.5*  HGB 14.0   < > 13.0 13.3 13.4 13.5 12.9*  HCT 40.2   < > 38.1* 38.9* 39.1 40.1 38.6*  PLT 356   < > 293 277 264 255 242  MCV 89.5   < > 91.4 93.1 92.0 93.9 93.9  MCH 31.2   < > 31.2 31.8 31.5 31.6 31.4  MCHC 34.8   < > 34.1 34.2 34.3 33.7 33.4  RDW 12.9   < > 12.8 12.7 12.6 12.7 12.6  LYMPHSABS 0.5*  --   --   --   --   --   --   MONOABS 0.8  --   --   --   --   --   --   EOSABS 0.0  --   --   --   --   --   --   BASOSABS 0.1  --   --   --   --   --   --    < > =  values in this interval not displayed.    Chemistries  Recent Labs  Lab 09/19/20 0208 09/20/20 0103 09/21/20 0206 09/22/20 0051 09/23/20 0426  NA 133* 133* 132* 133* 133*  K 4.4 4.6 5.2* 4.6 5.1  CL 97* 98 99 98 99  CO2 GLUCOSE 161* 155* 152* 157* 209*  BUN 40* 47* 43* 44* 46*  CREATININE 1.43* 1.51* 1.46* 1.60* 1.55*  CALCIUM 8.6* 8.4* 8.6* 8.8* 9.0  AST 43* 42* 71* 88* 64*  ALT 55* 54* 89* 127* 107*  ALKPHOS 106 102 104 116 102  BILITOT 0.7 1.1 0.9 1.0 0.7   ------------------------------------------------------------------------------------------------------------------ No results for input(s): CHOL, HDL, LDLCALC, TRIG, CHOLHDL, LDLDIRECT in the last 72 hours.  No results found for: HGBA1C ------------------------------------------------------------------------------------------------------------------ No results for input(s): TSH, T4TOTAL, T3FREE, THYROIDAB in the last 72 hours.  Invalid input(s):  FREET3 ------------------------------------------------------------------------------------------------------------------ No results for input(s): VITAMINB12, FOLATE, FERRITIN, TIBC, IRON, RETICCTPCT in the last 72 hours.  Coagulation profile No results for input(s): INR, PROTIME in the last 168 hours.  Recent Labs    09/22/20 0051 09/23/20 0426  DDIMER 0.47 0.52*    Cardiac Enzymes No results for input(s): CKMB, TROPONINI, MYOGLOBIN in the last 168 hours.  Invalid input(s): CK ------------------------------------------------------------------------------------------------------------------    Component Value Date/Time   BNP 240.6 (H) 09/12/2020 1342    Micro Results No results found for this or any previous visit (from the past 240 hour(s)).  Radiology Reports CT Angio Chest PE W and/or Wo Contrast  Result Date: 09/12/2020 CLINICAL DATA:  79 year old male with elevated D-dimer. Concern for pulmonary embolism. EXAM: CT ANGIOGRAPHY CHEST WITH CONTRAST TECHNIQUE: Multidetector CT imaging of the chest was performed using the standard protocol during bolus administration of intravenous contrast. Multiplanar CT image reconstructions and MIPs were obtained to evaluate the vascular anatomy. CONTRAST:  75mL OMNIPAQUE IOHEXOL 350 MG/ML SOLN COMPARISON:  Chest radiograph dated 09/12/2020. FINDINGS: Evaluation of this exam is limited due to respiratory motion artifact. Cardiovascular: There is no cardiomegaly or pericardial effusion. There is 3 vessel coronary vascular calcification. Moderate atherosclerotic calcification of the thoracic aorta. No aneurysmal dilatation or dissection. The origins of the great vessels of the aortic arch appear patent as visualized. Evaluation of the pulmonary arteries is limited due to respiratory motion artifact as well as streak artifact caused by patient's arms. No large or central pulmonary artery embolus identified. Mediastinum/Nodes: No hilar or mediastinal  adenopathy. The esophagus and the thyroid gland are grossly unremarkable. No mediastinal fluid collection. Lungs/Pleura: Extensive patchy bilateral pulmonary opacities with predominant involvement of the lower lobes most consistent with multifocal pneumonia, likely viral or atypical in etiology including COVID-19. Clinical correlation is recommended. There are small bilateral pleural effusions. No pneumothorax. The central airways are patent. Upper Abdomen: No acute abnormality. Musculoskeletal: Degenerative changes of the spine. No acute osseous pathology. Review of the MIP images confirms the above findings. IMPRESSION: 1. No CT evidence of central pulmonary artery embolus. 2. Multifocal pneumonia, likely viral or atypical in etiology. Clinical correlation and follow-up to resolution recommended. 3. Small bilateral pleural effusions. 4. Aortic Atherosclerosis (ICD10-I70.0). Electronically Signed   By: Elgie Collard M.D.   On: 09/12/2020 19:05   DG Chest Port 1 View  Result Date: 09/12/2020 CLINICAL DATA:  Shortness of breath, COVID-19 positive EXAM: PORTABLE CHEST 1 VIEW COMPARISON:  10/07/2008 FINDINGS: Heart size within normal limits. Aortic atherosclerosis. There are diffuse interstitial opacities throughout the left lung, most confluent within the left lung base. Mild interstitial  prominence within the right lung. No large pleural fluid collection. No pneumothorax. IMPRESSION: Diffuse interstitial opacities throughout the left lung, most confluent within the left lung base. Findings concerning for multifocal atypical/viral infection versus asymmetric edema. Electronically Signed   By: Duanne Guess D.O.   On: 09/12/2020 14:59   DG Chest Port 1V same Day  Result Date: 09/15/2020 CLINICAL DATA:  Shortness of breath.  COVID-19 positive EXAM: PORTABLE CHEST 1 VIEW COMPARISON:  Chest radiograph and chest CT September 12, 2020 FINDINGS: There is patchy airspace opacity throughout the lungs bilaterally,  similar to recent prior studies. No consolidation. Heart size and pulmonary vascularity within normal limits. No adenopathy. There is aortic atherosclerosis. No bone lesions IMPRESSION: Persistent multifocal airspace opacity consistent with atypical organism pneumonia. No appreciable new opacity compared to recent studies. Stable cardiac silhouette. Aortic Atherosclerosis (ICD10-I70.0). Electronically Signed   By: Bretta Bang III M.D.   On: 09/15/2020 07:43   ECHOCARDIOGRAM COMPLETE  Result Date: 09/14/2020    ECHOCARDIOGRAM REPORT   Patient Name:   OTILIO GROLEAU Date of Exam: 09/13/2020 Medical Rec #:  [id number]       Height:       65.0 in Accession #:    [id number]      Weight:       145.0 lb Date of Birth:  08-25-1941       BSA:          1.725 m Patient Age:    79 years        BP:           141/76 mmHg Patient Gender: M               HR:           101 bpm. Exam Location:  Inpatient Procedure: 2D Echo, Cardiac Doppler and Color Doppler Indications:    Acute Respiratory Failure 518.82 / R06.89  History:        Patient has no prior history of Echocardiogram examinations.                 Risk Factors:Hypertension. Covid -19 Positive.  Sonographer:    Tiffany Dance Referring Phys: Jeoffrey Massed, M IMPRESSIONS  1. Left ventricular ejection fraction, by estimation, is 55 to 60%. The left ventricle has normal function. The left ventricle has no regional wall motion abnormalities. There is mild concentric left ventricular hypertrophy. Left ventricular diastolic parameters were normal.  2. Right ventricular systolic function is normal. The right ventricular size is normal. There is normal pulmonary artery systolic pressure. The estimated right ventricular systolic pressure is 24.5 mmHg.  3. A small pericardial effusion is present. There is no evidence of cardiac tamponade.  4. The mitral valve is normal in structure. Trivial mitral valve regurgitation. No evidence of mitral stenosis.  5. The aortic valve is  tricuspid. There is mild calcification of the aortic valve. Aortic valve regurgitation is not visualized. Mild aortic valve sclerosis is present, with no evidence of aortic valve stenosis.  6. The inferior vena cava is normal in size with greater than 50% respiratory variability, suggesting right atrial pressure of 3 mmHg. Comparison(s): No prior Echocardiogram. Conclusion(s)/Recommendation(s): Otherwise normal echocardiogram, with minor abnormalities described in the report. FINDINGS  Left Ventricle: Left ventricular ejection fraction, by estimation, is 55 to 60%. The left ventricle has normal function. The left ventricle has no regional wall motion abnormalities. The left ventricular internal cavity size was normal in size. There is  mild concentric left  ventricular hypertrophy. Left ventricular diastolic parameters were normal. Right Ventricle: The right ventricular size is normal. No increase in right ventricular wall thickness. Right ventricular systolic function is normal. There is normal pulmonary artery systolic pressure. The tricuspid regurgitant velocity is 2.32 m/s, and  with an assumed right atrial pressure of 3 mmHg, the estimated right ventricular systolic pressure is 24.5 mmHg. Left Atrium: Left atrial size was normal in size. Right Atrium: Right atrial size was normal in size. Pericardium: A small pericardial effusion is present. There is no evidence of cardiac tamponade. Mitral Valve: The mitral valve is normal in structure. Trivial mitral valve regurgitation. No evidence of mitral valve stenosis. Tricuspid Valve: The tricuspid valve is normal in structure. Tricuspid valve regurgitation is trivial. No evidence of tricuspid stenosis. Aortic Valve: The aortic valve is tricuspid. There is mild calcification of the aortic valve. Aortic valve regurgitation is not visualized. Mild aortic valve sclerosis is present, with no evidence of aortic valve stenosis. Pulmonic Valve: The pulmonic valve was not well  visualized. Pulmonic valve regurgitation is not visualized. No evidence of pulmonic stenosis. Aorta: The aortic root, ascending aorta and aortic arch are all structurally normal, with no evidence of dilitation or obstruction. Venous: The inferior vena cava is normal in size with greater than 50% respiratory variability, suggesting right atrial pressure of 3 mmHg. IAS/Shunts: No atrial level shunt detected by color flow Doppler.  LEFT VENTRICLE PLAX 2D LVIDd:         4.00 cm LVIDs:         2.77 cm LV PW:         1.24 cm LV IVS:        1.16 cm LVOT diam:     2.00 cm LV SV:         51 LV SV Index:   30 LVOT Area:     3.14 cm  RIGHT VENTRICLE          IVC RV Basal diam:  2.58 cm  IVC diam: 1.42 cm TAPSE (M-mode): 2.4 cm LEFT ATRIUM             Index       RIGHT ATRIUM           Index LA diam:        3.30 cm 1.91 cm/m  RA Area:     12.00 cm LA Vol (A2C):   41.1 ml 23.82 ml/m RA Volume:   23.40 ml  13.56 ml/m LA Vol (A4C):   34.8 ml 20.17 ml/m LA Biplane Vol: 40.1 ml 23.24 ml/m  AORTIC VALVE LVOT Vmax:   92.15 cm/s LVOT Vmean:  59.400 cm/s LVOT VTI:    0.163 m  AORTA Ao Root diam: 3.50 cm Ao Asc diam:  3.60 cm MITRAL VALVE               TRICUSPID VALVE MV Area (PHT): 2.76 cm    TR Peak grad:   21.5 mmHg MV Decel Time: 275 msec    TR Vmax:        232.00 cm/s MV E velocity: 60.60 cm/s MV A velocity: 85.70 cm/s  SHUNTS MV E/A ratio:  0.71        Systemic VTI:  0.16 m                            Systemic Diam: 2.00 cm Jodelle Red MD Electronically signed by Jodelle Red MD Signature Date/Time: 09/14/2020/8:03:57 AM  Final    VAS [place] LOWER EXTREMITY VENOUS (DVT)  Result Date: 09/13/2020  Lower Venous DVTStudy Indications: Elevated Ddimer.  Risk Factors: COVID 19 positive. Comparison Study: No prior studies. Performing Technologist: Chanda Busing RVT  Examination Guidelines: A complete evaluation includes B-mode imaging, spectral Doppler, color Doppler, and power Doppler as needed of all  accessible portions of each vessel. Bilateral testing is considered an integral part of a complete examination. Limited examinations for reoccurring indications may be performed as noted. The reflux portion of the exam is performed with the patient in reverse Trendelenburg.  +---------+---------------+---------+-----------+----------+--------------+ RIGHT    CompressibilityPhasicitySpontaneityPropertiesThrombus Aging +---------+---------------+---------+-----------+----------+--------------+ CFV      Full           Yes      Yes                                 +---------+---------------+---------+-----------+----------+--------------+ SFJ      Full                                                        +---------+---------------+---------+-----------+----------+--------------+ FV Prox  Full                                                        +---------+---------------+---------+-----------+----------+--------------+ FV Mid   Full                                                        +---------+---------------+---------+-----------+----------+--------------+ FV DistalFull                                                        +---------+---------------+---------+-----------+----------+--------------+ PFV      Full                                                        +---------+---------------+---------+-----------+----------+--------------+ POP      Full           Yes      Yes                                 +---------+---------------+---------+-----------+----------+--------------+ PTV      Full                                                        +---------+---------------+---------+-----------+----------+--------------+ PERO     Full                                                        +---------+---------------+---------+-----------+----------+--------------+   +---------+---------------+---------+-----------+----------+--------------+  LEFT     CompressibilityPhasicitySpontaneityPropertiesThrombus Aging +---------+---------------+---------+-----------+----------+--------------+ CFV      Full           Yes      Yes                                 +---------+---------------+---------+-----------+----------+--------------+ SFJ      Full                                                        +---------+---------------+---------+-----------+----------+--------------+ FV Prox  Full                                                        +---------+---------------+---------+-----------+----------+--------------+ FV Mid   Full                                                        +---------+---------------+---------+-----------+----------+--------------+ FV DistalFull                                                        +---------+---------------+---------+-----------+----------+--------------+ PFV      Full                                                        +---------+---------------+---------+-----------+----------+--------------+ POP      Full           Yes      Yes                                 +---------+---------------+---------+-----------+----------+--------------+ PTV      Full                                                        +---------+---------------+---------+-----------+----------+--------------+ PERO     Full                                                        +---------+---------------+---------+-----------+----------+--------------+     Summary: RIGHT: - There is no evidence of deep vein thrombosis in the lower extremity.  - No cystic structure found in the popliteal fossa.  LEFT: - There is no evidence of deep vein thrombosis in the lower extremity.  - No cystic  structure found in the popliteal fossa.  *See table(s) above for measurements and observations. Electronically signed by Coral ElseVance Brabham MD on 09/13/2020 at 8:53:51 PM.    Final

## 2020-09-24 MED ORDER — AMLODIPINE BESYLATE 10 MG PO TABS: 10.0000 mg | ORAL_TABLET | Freq: Every day | ORAL | 11 refills | Status: DC

## 2020-09-24 MED ORDER — ASPIRIN EC 81 MG PO TBEC: 81.0000 mg | DELAYED_RELEASE_TABLET | Freq: Every day | ORAL | 0 refills | Status: DC

## 2020-09-24 MED ORDER — OXYMETAZOLINE HCL 0.05 % NA SOLN: 3.0000 | Freq: Two times a day (BID) | NASAL | 0 refills | Status: DC | PRN

## 2020-09-24 MED ORDER — PANTOPRAZOLE SODIUM 40 MG PO TBEC: 40.0000 mg | DELAYED_RELEASE_TABLET | Freq: Every day | ORAL | 0 refills | Status: DC

## 2020-09-24 MED ORDER — METHYLPREDNISOLONE SODIUM SUCC 40 MG IJ SOLR
40.0000 mg | Freq: Every day | INTRAMUSCULAR | Status: DC
  Administered 2020-09-24: 40 mg via INTRAVENOUS
  Filled 2020-09-24: qty 1

## 2020-09-24 MED ORDER — ALBUTEROL SULFATE HFA 108 (90 BASE) MCG/ACT IN AERS: 2.0000 | INHALATION_SPRAY | Freq: Four times a day (QID) | RESPIRATORY_TRACT | 0 refills | Status: DC | PRN

## 2020-09-24 NOTE — Progress Notes (Signed)
Pt discharged home via [facility].  Discharge instructions explained, pt verbalizes understanding.  Daughter Larry Olsen) was also called and discharge instructions explained, she verbalizes understanding and denies any questions.

## 2020-09-24 NOTE — Discharge Instructions (Signed)
Follow with Primary MD in 7 days   Get CBC, CMP, 2 view Chest X ray -  checked next visit within 1 week by Primary MD    Activity: As tolerated with Full fall precautions use walker/cane & assistance as needed  Disposition Home    Diet: Heart Healthy    Special Instructions: If you have smoked or chewed Tobacco  in the last 2 yrs please stop smoking, stop any regular Alcohol  and or any Recreational drug use.  On your next visit with your primary care physician please Get Medicines reviewed and adjusted.  Please request your Prim.MD to go over all Hospital Tests and Procedure/Radiological results at the follow up, please get all Hospital records sent to your Prim MD by signing hospital release before you go home.  If you experience worsening of your admission symptoms, develop shortness of breath, life threatening emergency, suicidal or homicidal thoughts you must seek medical attention immediately by calling 911 or calling your MD immediately  if symptoms less severe.  You Must read complete instructions/literature along with all the possible adverse reactions/side effects for all the Medicines you take and that have been prescribed to you. Take any new Medicines after you have completely understood and accpet all the possible adverse reactions/side effects.         Person Under Monitoring Name: Larry Olsen  Location: [address] [place] [address]   Infection Prevention Recommendations for Individuals Confirmed to have, or Being Evaluated for, 2019 Novel Coronavirus (COVID-19) Infection Who Receive Care at Home  Individuals who are confirmed to have, or are being evaluated for, COVID-19 should follow the prevention steps below until a healthcare provider or local or state health department says they can return to normal activities.  Stay home except to get medical care You should restrict activities outside your home, except for getting medical care. Do not go  to work, school, or public areas, and do not use public transportation or taxis.  Call ahead before visiting your doctor Before your medical appointment, call the healthcare provider and tell them that you have, or are being evaluated for, COVID-19 infection. This will help the healthcare provider's office take steps to keep other people from getting infected. Ask your healthcare provider to call the local or state health department.  Monitor your symptoms Seek prompt medical attention if your illness is worsening (e.g., difficulty breathing). Before going to your medical appointment, call the healthcare provider and tell them that you have, or are being evaluated for, COVID-19 infection. Ask your healthcare provider to call the local or state health department.  Wear a facemask You should wear a facemask that covers your nose and mouth when you are in the same room with other people and when you visit a healthcare provider. People who live with or visit you should also wear a facemask while they are in the same room with you.  Separate yourself from other people in your home As much as possible, you should stay in a different room from other people in your home. Also, you should use a separate bathroom, if available.  Avoid sharing household items You should not share dishes, drinking glasses, cups, eating utensils, towels, bedding, or other items with other people in your home. After using these items, you should wash them thoroughly with soap and water.  Cover your coughs and sneezes Cover your mouth and nose with a tissue when you cough or sneeze, or you can cough or  sneeze into your sleeve. Throw used tissues in a lined trash can, and immediately wash your hands with soap and water for at least 20 seconds or use an alcohol-based hand rub.  Wash your [employer] your hands often and thoroughly with soap and water for at least 20 seconds. You can use an alcohol-based hand sanitizer  if soap and water are not available and if your hands are not visibly dirty. Avoid touching your eyes, nose, and mouth with unwashed hands.   Prevention Steps for Caregivers and Household Members of Individuals Confirmed to have, or Being Evaluated for, COVID-19 Infection Being Cared for in the Home  If you live with, or provide care at home for, a person confirmed to have, or being evaluated for, COVID-19 infection please follow these guidelines to prevent infection:  Follow healthcare provider's instructions Make sure that you understand and can help the patient follow any healthcare provider instructions for all care.  Provide for the patient's basic needs You should help the patient with basic needs in the home and provide support for getting groceries, prescriptions, and other personal needs.  Monitor the patient's symptoms If they are getting sicker, call his or her medical provider and tell them that the patient has, or is being evaluated for, COVID-19 infection. This will help the healthcare provider's office take steps to keep other people from getting infected. Ask the healthcare provider to call the local or state health department.  Limit the number of people who have contact with the patient  If possible, have only one caregiver for the patient.  Other household members should stay in another home or place of residence. If this is not possible, they should stay  in another room, or be separated from the patient as much as possible. Use a separate bathroom, if available.  Restrict visitors who do not have an essential need to be in the home.  Keep older adults, very young children, and other sick people away from the patient Keep older adults, very young children, and those who have compromised immune systems or chronic health conditions away from the patient. This includes people with chronic heart, lung, or kidney conditions, diabetes, and cancer.  Ensure good  ventilation Make sure that shared spaces in the home have good air flow, such as from an air conditioner or an opened window, weather permitting.  Wash your hands often  Wash your hands often and thoroughly with soap and water for at least 20 seconds. You can use an alcohol based hand sanitizer if soap and water are not available and if your hands are not visibly dirty.  Avoid touching your eyes, nose, and mouth with unwashed hands.  Use disposable paper towels to dry your hands. If not available, use dedicated cloth towels and replace them when they become wet.  Wear a facemask and gloves  Wear a disposable facemask at all times in the room and gloves when you touch or have contact with the patient's blood, body fluids, and/or secretions or excretions, such as sweat, saliva, sputum, nasal mucus, vomit, urine, or feces.  Ensure the mask fits over your nose and mouth tightly, and do not touch it during use.  Throw out disposable facemasks and gloves after using them. Do not reuse.  Wash your hands immediately after removing your facemask and gloves.  If your personal clothing becomes contaminated, carefully remove clothing and launder. Wash your hands after handling contaminated clothing.  Place all used disposable facemasks, gloves, and other waste  in a lined container before disposing them with other household waste.  Remove gloves and wash your hands immediately after handling these items.  Do not share dishes, glasses, or other household items with the patient  Avoid sharing household items. You should not share dishes, drinking glasses, cups, eating utensils, towels, bedding, or other items with a patient who is confirmed to have, or being evaluated for, COVID-19 infection.  After the person uses these items, you should wash them thoroughly with soap and water.  Wash laundry thoroughly  Immediately remove and wash clothes or bedding that have blood, body fluids, and/or  secretions or excretions, such as sweat, saliva, sputum, nasal mucus, vomit, urine, or feces, on them.  Wear gloves when handling laundry from the patient.  Read and follow directions on labels of laundry or clothing items and detergent. In general, wash and dry with the warmest temperatures recommended on the label.  Clean all areas the individual has used often  Clean all touchable surfaces, such as counters, tabletops, doorknobs, bathroom fixtures, toilets, phones, keyboards, tablets, and bedside tables, every day. Also, clean any surfaces that may have blood, body fluids, and/or secretions or excretions on them.  Wear gloves when cleaning surfaces the patient has come in contact with.  Use a diluted bleach solution (e.g., dilute bleach with 1 part bleach and 10 parts water) or a household disinfectant with a label that says EPA-registered for coronaviruses. To make a bleach solution at home, add 1 tablespoon of bleach to 1 quart (4 cups) of water. For a larger supply, add  cup of bleach to 1 gallon (16 cups) of water.  Read labels of cleaning products and follow recommendations provided on product labels. Labels contain instructions for safe and effective use of the cleaning product including precautions you should take when applying the product, such as wearing gloves or eye protection and making sure you have good ventilation during use of the product.  Remove gloves and wash hands immediately after cleaning.  Monitor yourself for signs and symptoms of illness Caregivers and household members are considered close contacts, should monitor their health, and will be asked to limit movement outside of the home to the extent possible. Follow the monitoring steps for close contacts listed on the symptom monitoring form.   ? If you have additional questions, contact your local health department or call the epidemiologist on call at [phone] (available 24/7). ? This guidance is subject  to change. For the most up-to-date guidance from [facility], please refer to their website: [email or web address]

## 2020-09-24 NOTE — TOC Progression Note (Addendum)
Transition of Care [facility]) - Progression Note    Patient Details  Name: Larry Olsen MRN: [id number] Date of Birth: May 03, 1941  Transition of [facility]) CM/SW Contact  Verna Czech [place], [place] Phone Number: [phone] 09/24/2020, 9:54 AM  Clinical Narrative:    Phone call to [facility], spoke with Everardo [employer] who stated that patient has been accepted, however due to staffing they are not able to take patient until tomorrow(11/8) at  around 12 noon.  10:05am patient to now discharge home. This Child psychotherapist will inform [employer].   Meghan Tiemann, LCSW Transition of Care [phone]      Barriers to Discharge: Continued Medical Work up, English as a second language teacher, SNF Pending bed offer  Expected Discharge Plan and Services   In-house Referral: Clinical Social Work     Living arrangements for the past 2 months: Single Family Home Expected Discharge Date: 09/24/20                                     Social Determinants of Health (SDOH) Interventions    Readmission Risk Interventions No flowsheet data found.

## 2020-09-24 NOTE — Discharge Summary (Addendum)
Larry Olsen:[id number] DOB: [birth year] DOA: 09/12/2020  PCP: Patient, No Pcp Per  Admit date: 09/12/2020  Discharge date: 09/24/2020  Admitted From: Home  Disposition:  Home   Recommendations for Outpatient Follow-up:   Follow up with PCP in 1-2 weeks  PCP Please obtain BMP/CBC, 2 view CXR in 1week,  (see Discharge instructions)   PCP Please follow up on the following pending results: Check CBC, CMP, 2 view chest x-ray in a week, outpatient cardiology follow-up for a small pericardial effusion noted on echocardiogram.   Home Health: PT,RN   Equipment/Devices: rolling walker, 3lit o2  Consultations: None  Discharge Condition: Stable    CODE STATUS: Full    Diet Recommendation: Heart Healthy   Diet Order            Diet - low sodium heart healthy           Diet Heart Room service appropriate? Yes; Fluid consistency: Thin  Diet effective now                  Chief Complaint  Patient presents with  . Shortness of Breath  . Covid Positive     Brief history of present illness from the day of admission and additional interim summary     Patient is a 79 y.o. male with PMHx of HTN, HLD-known Covid 19+ approximately 2 weeks prior to this hospital stay-presenting with severe shortness of breath-found to have severe acute hypoxemic respiratory failure requiring 15 L of HFNC in the setting of COVID-19 pneumonia.  He has improved significantly with COVID-19 therapeutic agents-he still is very symptomatic with minimal ambulation-see below for further details.  COVID-19 vaccinated status: Unvaccinated  Significant Events: 10/26>> Admit to [facility] for severe hypoxia due to COVID-19 pneumonia, AKI and hyponatremia.  Significant studies: 10/26>>Chest x-ray: Diffuse interstitial airspace opacities 10/26>>  CTA chest: No PE, multifocal pneumonia 10/27>> bilateral lower extremity Doppler: No DVT 10/27>> Echo: EF 55-60%, no wall motion abnormalities, small pericardial effusion  COVID-19 medications: Steroids: 10/26>> Remdesivir: 10/26>> 10/30 Actemra: 10/26 x 1                                                                 Hospital Course   Acute Hypoxic Resp Failure due to Covid 19 Viral pneumonia: He is unfortunately not vaccinated and incurred severe parenchymal lung injury due to COVID-19 pneumonia, was treated with IV steroids remdesivir and Actemra, he gradually but definitely showed improvement now symptom-free on 2 L nasal cannula oxygen, has finished his Covid treatment and will be discharged home with a rescue inhaler, on home oxygen and home RN.  Will follow with PCP in a week.  Note exertional hypoxia can continue for several weeks due to severe parenchymal injury.  SpO2: 96 % O2 Flow Rate (L/min):  2 L/min FiO2 (%): (S) 40 %  Recent Labs  Lab 09/19/20 0208 09/20/20 0103 09/21/20 0206 09/22/20 0051 09/23/20 0426  WBC 32.2* 27.5* 25.8* 27.1* 26.5*  CRP 0.7 0.6 0.6 0.5 0.9  DDIMER 0.68* 0.65* 0.57* 0.47 0.52*  AST 43* 42* 71* 88* 64*  ALT 55* 54* 89* 127* 107*  ALKPHOS 106 102 104 116 102  BILITOT 0.7 1.1 0.9 1.0 0.7  ALBUMIN 2.6* 2.5* 2.7* 2.7* 2.6*      Leukocytosis: Secondary to steroids-no indication for bacterial infection.    PCP to repeat CBC in 7 to 10 days post discharge.  Elevated D-dimer: Secondary to COVID-19- CTA chest negative for PE/lower extremity Dopplers negative for DVT-was on intermediate dosing of Lovenox-has been changed to daily dosing.  AKI: Likely hemodynamically mediated-overall improved but creatinine seems to have plateaued around 1.5-1.6 range.    ECP to repeat CMP in 7 to 10 days, discontinue ARB upon discharge.  Transaminitis: Secondary to COVID-19-trend is good PCP to repeat CMP in 7 to 10 days post discharge.    Troponin elevation:  Trend is flat-not consistent with ACS-likely demand ischemia due to severe hypoxemia/AKI-echo with preserved EF-no wall motion abnormality with trivial pericardial effusion.    Outpatient cardiology follow-up in 1 to 2 weeks recommended. ASA added upon discharge.  HTN: BP stable-continue amlodipine place of ARB due to renal dysfunction.  HLD: Cautiously continue with statin      Discharge diagnosis     Principal Problem:   Pneumonia due to COVID-19 virus Active Problems:   Hyponatremia   ARF (acute renal failure) (HCC)   Demand ischemia (HCC)    Discharge instructions    Discharge Instructions    Diet - low sodium heart healthy   Complete by: As directed    Discharge instructions   Complete by: As directed    Follow with Primary MD in 7 days   Get CBC, CMP, 2 view Chest X ray -  checked next visit within 1 week by Primary MD    Activity: As tolerated with Full fall precautions use walker/cane & assistance as needed  Disposition Home    Diet: Heart Healthy    Special Instructions: If you have smoked or chewed Tobacco  in the last 2 yrs please stop smoking, stop any regular Alcohol  and or any Recreational drug use.  On your next visit with your primary care physician please Get Medicines reviewed and adjusted.  Please request your Prim.MD to go over all Hospital Tests and Procedure/Radiological results at the follow up, please get all Hospital records sent to your Prim MD by signing hospital release before you go home.  If you experience worsening of your admission symptoms, develop shortness of breath, life threatening emergency, suicidal or homicidal thoughts you must seek medical attention immediately by calling 911 or calling your MD immediately  if symptoms less severe.  You Must read complete instructions/literature along with all the possible adverse reactions/side effects for all the Medicines you take and that have been prescribed to you. Take any new  Medicines after you have completely understood and accpet all the possible adverse reactions/side effects.   Increase activity slowly   Complete by: As directed       Discharge Medications   Allergies as of 09/24/2020   No Known Allergies     Medication List    STOP taking these medications   azithromycin 250 MG tablet Commonly known as: ZITHROMAX   ondansetron 4 MG disintegrating tablet Commonly  known as: Zofran ODT   predniSONE 50 MG tablet Commonly known as: DELTASONE   valsartan 160 MG tablet Commonly known as: DIOVAN     TAKE these medications   albuterol 108 (90 Base) MCG/ACT inhaler Commonly known as: VENTOLIN HFA Inhale 2 puffs into the lungs every 6 (six) hours as needed for wheezing or shortness of breath.   amLODipine 10 MG tablet Commonly known as: NORVASC Take 1 tablet (10 mg total) by mouth daily.   aspirin EC 81 MG tablet Take 1 tablet (81 mg total) by mouth daily.   atorvastatin 80 MG tablet Commonly known as: LIPITOR Take 80 mg by mouth daily.   ondansetron 4 MG tablet Commonly known as: ZOFRAN Take 4 mg by mouth at bedtime.   oxymetazoline 0.05 % nasal spray Commonly known as: AFRIN Place 3 sprays into both nostrils 2 (two) times daily as needed for congestion (EPISTAXIS).   pantoprazole 40 MG tablet Commonly known as: Protonix Take 1 tablet (40 mg total) by mouth daily.            Durable Medical Equipment  (From admission, onward)         Start     Ordered   09/24/20 0848  For home use only DME oxygen  Once       Question Answer Comment  Length of Need 6 Months   Mode or (Route) Nasal cannula   Liters per Minute 3   Frequency Continuous (stationary and portable oxygen unit needed)   Oxygen delivery system Gas      09/24/20 0847   09/24/20 0847  For home use only DME Walker rolling  Once       Comments: 5 wheel  Question Answer Comment  Walker: With 5 Inch Wheels   Patient needs a walker to treat with the following  condition Weakness      09/24/20 0847           Contact information for follow-up providers    [facility]. Schedule an appointment as soon as possible for a visit in 1 week(s).   Contact information: [address] [place] [address] [phone]       Yates Decamp, MD. Schedule an appointment as soon as possible for a visit in 1 week(s).   Specialty: Cardiology Why: Small pericardial effusion follow-up Contact information: [address] [place] [address] [phone]            Contact information for after-discharge care    Destination    [facility] .   Service: Skilled Nursing Contact information: [address] [place] [address] [phone]                  Major procedures and Radiology Reports - PLEASE review detailed and final reports thoroughly  -        CT Angio Chest PE W and/or Wo Contrast  Result Date: 09/12/2020 CLINICAL DATA:  79 year old male with elevated D-dimer. Concern for pulmonary embolism. EXAM: CT ANGIOGRAPHY CHEST WITH CONTRAST TECHNIQUE: Multidetector CT imaging of the chest was performed using the standard protocol during bolus administration of intravenous contrast. Multiplanar CT image reconstructions and MIPs were obtained to evaluate the vascular anatomy. CONTRAST:  51mL OMNIPAQUE IOHEXOL 350 MG/ML SOLN COMPARISON:  Chest radiograph dated 09/12/2020. FINDINGS: Evaluation of this exam is limited due to respiratory motion artifact. Cardiovascular: There is no cardiomegaly or pericardial effusion. There is 3 vessel  coronary vascular calcification. Moderate atherosclerotic calcification of the thoracic aorta. No aneurysmal dilatation or dissection. The origins of the great vessels of the aortic arch appear patent as visualized. Evaluation of the pulmonary arteries is limited due to respiratory motion artifact as well as streak artifact  caused by patient's arms. No large or central pulmonary artery embolus identified. Mediastinum/Nodes: No hilar or mediastinal adenopathy. The esophagus and the thyroid gland are grossly unremarkable. No mediastinal fluid collection. Lungs/Pleura: Extensive patchy bilateral pulmonary opacities with predominant involvement of the lower lobes most consistent with multifocal pneumonia, likely viral or atypical in etiology including COVID-19. Clinical correlation is recommended. There are small bilateral pleural effusions. No pneumothorax. The central airways are patent. Upper Abdomen: No acute abnormality. Musculoskeletal: Degenerative changes of the spine. No acute osseous pathology. Review of the MIP images confirms the above findings. IMPRESSION: 1. No CT evidence of central pulmonary artery embolus. 2. Multifocal pneumonia, likely viral or atypical in etiology. Clinical correlation and follow-up to resolution recommended. 3. Small bilateral pleural effusions. 4. Aortic Atherosclerosis (ICD10-I70.0). Electronically Signed   By: Elgie Collard M.D.   On: 09/12/2020 19:05   DG Chest Port 1 View  Result Date: 09/12/2020 CLINICAL DATA:  Shortness of breath, COVID-19 positive EXAM: PORTABLE CHEST 1 VIEW COMPARISON:  10/07/2008 FINDINGS: Heart size within normal limits. Aortic atherosclerosis. There are diffuse interstitial opacities throughout the left lung, most confluent within the left lung base. Mild interstitial prominence within the right lung. No large pleural fluid collection. No pneumothorax. IMPRESSION: Diffuse interstitial opacities throughout the left lung, most confluent within the left lung base. Findings concerning for multifocal atypical/viral infection versus asymmetric edema. Electronically Signed   By: Duanne Guess D.O.   On: 09/12/2020 14:59   DG Chest Port 1V same Day  Result Date: 09/15/2020 CLINICAL DATA:  Shortness of breath.  COVID-19 positive EXAM: PORTABLE CHEST 1 VIEW  COMPARISON:  Chest radiograph and chest CT September 12, 2020 FINDINGS: There is patchy airspace opacity throughout the lungs bilaterally, similar to recent prior studies. No consolidation. Heart size and pulmonary vascularity within normal limits. No adenopathy. There is aortic atherosclerosis. No bone lesions IMPRESSION: Persistent multifocal airspace opacity consistent with atypical organism pneumonia. No appreciable new opacity compared to recent studies. Stable cardiac silhouette. Aortic Atherosclerosis (ICD10-I70.0). Electronically Signed   By: Bretta Bang III M.D.   On: 09/15/2020 07:43   ECHOCARDIOGRAM COMPLETE  Result Date: 09/14/2020    ECHOCARDIOGRAM REPORT   Patient Name:   Larry Olsen Date of Exam: 09/13/2020 Medical Rec #:  [id number]       Height:       65.0 in Accession #:    [id number]      Weight:       145.0 lb Date of Birth:  [birth year]       BSA:          1.725 m Patient Age:    79 years        BP:           141/76 mmHg Patient Gender: M               HR:           101 bpm. Exam Location:  Inpatient Procedure: 2D Echo, Cardiac Doppler and Color Doppler Indications:    Acute Respiratory Failure 518.82 / R06.89  History:        Patient has no prior history of Echocardiogram examinations.  Risk Factors:Hypertension. Covid -19 Positive.  Sonographer:    Tiffany Dance Referring Phys: Jeoffrey Massed, M IMPRESSIONS  1. Left ventricular ejection fraction, by estimation, is 55 to 60%. The left ventricle has normal function. The left ventricle has no regional wall motion abnormalities. There is mild concentric left ventricular hypertrophy. Left ventricular diastolic parameters were normal.  2. Right ventricular systolic function is normal. The right ventricular size is normal. There is normal pulmonary artery systolic pressure. The estimated right ventricular systolic pressure is 24.5 mmHg.  3. A small pericardial effusion is present. There is no evidence of cardiac tamponade.   4. The mitral valve is normal in structure. Trivial mitral valve regurgitation. No evidence of mitral stenosis.  5. The aortic valve is tricuspid. There is mild calcification of the aortic valve. Aortic valve regurgitation is not visualized. Mild aortic valve sclerosis is present, with no evidence of aortic valve stenosis.  6. The inferior vena cava is normal in size with greater than 50% respiratory variability, suggesting right atrial pressure of 3 mmHg. Comparison(s): No prior Echocardiogram. Conclusion(s)/Recommendation(s): Otherwise normal echocardiogram, with minor abnormalities described in the report. FINDINGS  Left Ventricle: Left ventricular ejection fraction, by estimation, is 55 to 60%. The left ventricle has normal function. The left ventricle has no regional wall motion abnormalities. The left ventricular internal cavity size was normal in size. There is  mild concentric left ventricular hypertrophy. Left ventricular diastolic parameters were normal. Right Ventricle: The right ventricular size is normal. No increase in right ventricular wall thickness. Right ventricular systolic function is normal. There is normal pulmonary artery systolic pressure. The tricuspid regurgitant velocity is 2.32 m/s, and  with an assumed right atrial pressure of 3 mmHg, the estimated right ventricular systolic pressure is 24.5 mmHg. Left Atrium: Left atrial size was normal in size. Right Atrium: Right atrial size was normal in size. Pericardium: A small pericardial effusion is present. There is no evidence of cardiac tamponade. Mitral Valve: The mitral valve is normal in structure. Trivial mitral valve regurgitation. No evidence of mitral valve stenosis. Tricuspid Valve: The tricuspid valve is normal in structure. Tricuspid valve regurgitation is trivial. No evidence of tricuspid stenosis. Aortic Valve: The aortic valve is tricuspid. There is mild calcification of the aortic valve. Aortic valve regurgitation is not  visualized. Mild aortic valve sclerosis is present, with no evidence of aortic valve stenosis. Pulmonic Valve: The pulmonic valve was not well visualized. Pulmonic valve regurgitation is not visualized. No evidence of pulmonic stenosis. Aorta: The aortic root, ascending aorta and aortic arch are all structurally normal, with no evidence of dilitation or obstruction. Venous: The inferior vena cava is normal in size with greater than 50% respiratory variability, suggesting right atrial pressure of 3 mmHg. IAS/Shunts: No atrial level shunt detected by color flow Doppler.  LEFT VENTRICLE PLAX 2D LVIDd:         4.00 cm LVIDs:         2.77 cm LV PW:         1.24 cm LV IVS:        1.16 cm LVOT diam:     2.00 cm LV SV:         51 LV SV Index:   30 LVOT Area:     3.14 cm  RIGHT VENTRICLE          IVC RV Basal diam:  2.58 cm  IVC diam: 1.42 cm TAPSE (M-mode): 2.4 cm LEFT ATRIUM  Index       RIGHT ATRIUM           Index LA diam:        3.30 cm 1.91 cm/m  RA Area:     12.00 cm LA Vol (A2C):   41.1 ml 23.82 ml/m RA Volume:   23.40 ml  13.56 ml/m LA Vol (A4C):   34.8 ml 20.17 ml/m LA Biplane Vol: 40.1 ml 23.24 ml/m  AORTIC VALVE LVOT Vmax:   92.15 cm/s LVOT Vmean:  59.400 cm/s LVOT VTI:    0.163 m  AORTA Ao Root diam: 3.50 cm Ao Asc diam:  3.60 cm MITRAL VALVE               TRICUSPID VALVE MV Area (PHT): 2.76 cm    TR Peak grad:   21.5 mmHg MV Decel Time: 275 msec    TR Vmax:        232.00 cm/s MV E velocity: 60.60 cm/s MV A velocity: 85.70 cm/s  SHUNTS MV E/A ratio:  0.71        Systemic VTI:  0.16 m                            Systemic Diam: 2.00 cm Jodelle Red MD Electronically signed by Jodelle Red MD Signature Date/Time: 09/14/2020/8:03:57 AM    Final    VAS [place] LOWER EXTREMITY VENOUS (DVT)  Result Date: 09/13/2020  Lower Venous DVTStudy Indications: Elevated Ddimer.  Risk Factors: COVID 19 positive. Comparison Study: No prior studies. Performing Technologist: Chanda Busing RVT   Examination Guidelines: A complete evaluation includes B-mode imaging, spectral Doppler, color Doppler, and power Doppler as needed of all accessible portions of each vessel. Bilateral testing is considered an integral part of a complete examination. Limited examinations for reoccurring indications may be performed as noted. The reflux portion of the exam is performed with the patient in reverse Trendelenburg.  +---------+---------------+---------+-----------+----------+--------------+ RIGHT    CompressibilityPhasicitySpontaneityPropertiesThrombus Aging +---------+---------------+---------+-----------+----------+--------------+ CFV      Full           Yes      Yes                                 +---------+---------------+---------+-----------+----------+--------------+ SFJ      Full                                                        +---------+---------------+---------+-----------+----------+--------------+ FV Prox  Full                                                        +---------+---------------+---------+-----------+----------+--------------+ FV Mid   Full                                                        +---------+---------------+---------+-----------+----------+--------------+ FV DistalFull                                                        +---------+---------------+---------+-----------+----------+--------------+  PFV      Full                                                        +---------+---------------+---------+-----------+----------+--------------+ POP      Full           Yes      Yes                                 +---------+---------------+---------+-----------+----------+--------------+ PTV      Full                                                        +---------+---------------+---------+-----------+----------+--------------+ PERO     Full                                                         +---------+---------------+---------+-----------+----------+--------------+   +---------+---------------+---------+-----------+----------+--------------+ LEFT     CompressibilityPhasicitySpontaneityPropertiesThrombus Aging +---------+---------------+---------+-----------+----------+--------------+ CFV      Full           Yes      Yes                                 +---------+---------------+---------+-----------+----------+--------------+ SFJ      Full                                                        +---------+---------------+---------+-----------+----------+--------------+ FV Prox  Full                                                        +---------+---------------+---------+-----------+----------+--------------+ FV Mid   Full                                                        +---------+---------------+---------+-----------+----------+--------------+ FV DistalFull                                                        +---------+---------------+---------+-----------+----------+--------------+ PFV      Full                                                        +---------+---------------+---------+-----------+----------+--------------+  POP      Full           Yes      Yes                                 +---------+---------------+---------+-----------+----------+--------------+ PTV      Full                                                        +---------+---------------+---------+-----------+----------+--------------+ PERO     Full                                                        +---------+---------------+---------+-----------+----------+--------------+     Summary: RIGHT: - There is no evidence of deep vein thrombosis in the lower extremity.  - No cystic structure found in the popliteal fossa.  LEFT: - There is no evidence of deep vein thrombosis in the lower extremity.  - No cystic structure found in the popliteal fossa.   *See table(s) above for measurements and observations. Electronically signed by Coral Else MD on 09/13/2020 at 8:53:51 PM.    Final     Micro Results     No results found for this or any previous visit (from the past 240 hour(s)).  Today   Subjective    Turrell Severt today has no headache,no chest abdominal pain,no new weakness tingling or numbness, feels much better wants to go home today.     Objective   Blood pressure 130/73, pulse 75, temperature 97.6 F (36.4 C), temperature source Axillary, resp. rate (!) 23, height  (1.651 m), weight 65.8 kg, SpO2 96 %.   Intake/Output Summary (Last 24 hours) at 09/24/2020 0853 Last data filed at 09/24/2020 0537 Gross per 24 hour  Intake 480 ml  Output 500 ml  Net -20 ml    Exam  Awake Alert, No new F.N deficits, Normal affect [place].AT,PERRAL Supple Neck,No JVD, No cervical lymphadenopathy appriciated.  Symmetrical Chest wall movement, Good air movement bilaterally, CTAB RRR,No Gallops,Rubs or new Murmurs, No Parasternal Heave +ve B.Sounds, Abd Soft, Non tender, No organomegaly appriciated, No rebound -guarding or rigidity. No Cyanosis, Clubbing or edema, No new Rash or bruise   Data Review   CBC w Diff:  Lab Results  Component Value Date   WBC 26.5 (H) 09/23/2020   HGB 12.9 (L) 09/23/2020   HCT 38.6 (L) 09/23/2020   PLT 242 09/23/2020   LYMPHOPCT 2 09/17/2020   MONOPCT 3 09/17/2020   EOSPCT 0 09/17/2020   BASOPCT 0 09/17/2020    CMP:  Lab Results  Component Value Date   NA 133 (L) 09/23/2020   K 5.1 09/23/2020   CL 99 09/23/2020   CO2 26 09/23/2020   BUN 46 (H) 09/23/2020   CREATININE 1.55 (H) 09/23/2020   PROT 5.0 (L) 09/23/2020   ALBUMIN 2.6 (L) 09/23/2020   BILITOT 0.7 09/23/2020   ALKPHOS 102 09/23/2020   AST 64 (H) 09/23/2020   ALT 107 (H) 09/23/2020  .   Total Time in preparing paper work, data evaluation and todays exam - 35 minutes  Susa Raring M.D on 09/24/2020 at 8:53 AM  [facility]   Office  [phone]

## 2020-09-24 NOTE — TOC Transition Note (Addendum)
Transition of Care [facility]) - CM/SW Discharge Note   Patient Details  Name: Larry Olsen MRN: [id number] Date of Birth: [birth year]  Transition of Care [facility]) CM/SW Contact:  Lawerance Sabal, RN Phone Number: 09/24/2020, 10:00 AM   Clinical Narrative:    Attempt to reach patient, no answer. Spoke w daughter Larry Olsen. She states that she spoke w MD this morning, and they are all in agreement to have patient DC to home. Patient will need [facility] transport, demographics verified. Oxygen set up with [employer]. Larry Olsen will call me when it has been delivered to the house so I can call [facility]. HH has been set up with [facility]. Larry Olsen states that she lives with patient and he will have 24 hour supervision.   11:50 Patient's wife states that O2 has been delivered to the house, [facility] called for transport, RN notified via secure chat. 3/1 ordered to be delivered to the house through [employer].     Final next level of care: Home w Home Health Services Barriers to Discharge: No Barriers Identified   Patient Goals and CMS Choice Patient states their goals for this hospitalization and ongoing recovery are:: to go home CMS Medicare.gov Compare Post Acute Care list provided to:: Other (Comment Required) Choice offered to / list presented to : Adult Children  Discharge Placement                       Discharge Plan and Services In-house Referral: Clinical Social Work              DME Arranged: Oxygen DME Agency:  Loyal B[facility]) Date DME Agency Contacted: 09/24/20 Time DME Agency Contacted: 1000 Representative spoke with at DME Agency: Orvan July HH Arranged: RN, PT HH Agency: [facility] Health Care Date [facility] Agency Contacted: 09/24/20 Time HH Agency Contacted: 1000 Representative spoke with at [facility] Agency: Kandee Keen  Social Determinants of Health (SDOH) Interventions     Readmission Risk Interventions No flowsheet data found.

## 2020-10-25 ENCOUNTER — Other Ambulatory Visit: Payer: Self-pay

## 2020-10-25 ENCOUNTER — Encounter: Payer: Self-pay | Admitting: Cardiology

## 2020-10-25 ENCOUNTER — Ambulatory Visit: Payer: Medicare Other | Admitting: Cardiology

## 2020-10-25 VITALS — BP 123/73 | HR 103 | Resp 16 | Ht 65.0 in | Wt 135.0 lb

## 2020-10-25 DIAGNOSIS — M62830 Muscle spasm of back: Secondary | ICD-10-CM

## 2020-10-25 DIAGNOSIS — I10 Essential (primary) hypertension: Secondary | ICD-10-CM

## 2020-10-25 DIAGNOSIS — I2510 Atherosclerotic heart disease of native coronary artery without angina pectoris: Secondary | ICD-10-CM

## 2020-10-25 DIAGNOSIS — R0602 Shortness of breath: Secondary | ICD-10-CM

## 2020-10-25 DIAGNOSIS — E7800 Pure hypercholesterolemia, unspecified: Secondary | ICD-10-CM

## 2020-10-25 MED ORDER — ASPIRIN 81 MG PO CHEW: 81.0000 mg | CHEWABLE_TABLET | Freq: Every day | ORAL | Status: AC

## 2020-10-25 MED ORDER — METOPROLOL SUCCINATE ER 25 MG PO TB24: 25.0000 mg | ORAL_TABLET | Freq: Every day | ORAL | 2 refills | Status: AC

## 2020-10-25 MED ORDER — BACLOFEN 10 MG PO TABS: 10.0000 mg | ORAL_TABLET | Freq: Three times a day (TID) | ORAL | 0 refills | Status: DC

## 2020-10-25 NOTE — Progress Notes (Signed)
Primary Physician/Referring:  Daisy Floro, MD  Patient ID: Larry Olsen, male    DOB: July 23, 1941, 79 y.o.   MRN: [id number]  Chief Complaint  Patient presents with  . Shortness of Breath    Post COVID  . Small precardial effusion  . Hospitalization Follow-up  . New Patient (Initial Visit)   HPI:    Larry Olsen  is a 79 y.o. Caucasian male with hypertension, hyperlipidemia, had Covid pneumonia sometime in the beginning of September 2021 but was admitted to the hospital on 09/12/2020 with hypoxemic respiratory failure and Covid pneumonia.  He had a small pericardial effusion while in the hospital with preserved LVEF by echocardiogram on 09/13/2020, he is now referred to me for evaluation of dyspnea and exclude cardiac etiology.  He was discharged home on home oxygen but fortunately after 3 to 4 days of using oxygen, he is now off of home oxygen and has been able to ambulate in the hallway in his house without significant discomfort.  He still continues to have shortness of breath with minimal activity.  3 days ago he had incessant cough and then he pulled a muscle in his back on his right side and has been hurting since then.  He is in acute pain from this.  He has not had any hemoptysis, no nausea or vomiting, bowel movements have been normal.  He is accompanied by his daughter.  Past Medical History:  Diagnosis Date  . HTN (hypertension)    Past Surgical History:  Procedure Laterality Date  . PROSTATE SURGERY     History reviewed. No pertinent family history.  Social History   Tobacco Use  . Smoking status: Never Smoker  . Smokeless tobacco: Former Neurosurgeon    Types: Chew  Substance Use Topics  . Alcohol use: Not Currently   Marital Status: Widowed  ROS  Review of Systems  Cardiovascular: Positive for leg swelling. Negative for chest pain and dyspnea on exertion.  Musculoskeletal: Positive for back pain.  Gastrointestinal: Negative for melena.   Objective   Blood pressure 123/73, pulse (!) 103, resp. rate 16, height 5\' 5"  (1.651 m), weight 135 lb (61.2 kg), SpO2 97 %.  Vitals with BMI 10/25/2020 09/24/2020 09/24/2020  Height 5\' 5"  - -  Weight 135 lbs - -  BMI 22.47 - -  Systolic 123 138 13/05/2020  Diastolic 73 68 73  Pulse 103 100 75     Physical Exam Cardiovascular:     Rate and Rhythm: Normal rate and regular rhythm.     Pulses: Intact distal pulses.     Heart sounds: Normal heart sounds. No murmur heard.  No gallop.      Comments: No leg edema, no JVD. Pulmonary:     Effort: Pulmonary effort is normal.     Breath sounds: Normal breath sounds.  Abdominal:     General: Bowel sounds are normal.     Palpations: Abdomen is soft.  Musculoskeletal:     Lumbar back: Spasms (right lower back, focal) and tenderness present.    Laboratory examination:   Recent Labs    09/21/20 0206 09/22/20 0051 09/23/20 0426  NA 132* 133* 133*  K 5.2* 4.6 5.1  CL 99 98 99  CO2 25 25 26   GLUCOSE 152* 157* 209*  BUN 43* 44* 46*  CREATININE 1.46* 1.60* 1.55*  CALCIUM 8.6* 8.8* 9.0  GFRNONAA 48* 43* 45*   CrCl cannot be calculated (Patient's most recent lab result is older than the  maximum 21 days allowed.).  CMP Latest Ref Rng & Units 09/23/2020 09/22/2020 09/21/2020  Glucose 70 - 99 mg/dL 818(E) 993(Z) 169(C)  BUN 8 - 23 mg/dL 78(L) 38(B) 01(B)  Creatinine 0.61 - 1.24 mg/dL 5.10(C) 5.85(I) 7.78(E)  Sodium 135 - 145 mmol/L 133(L) 133(L) 132(L)  Potassium 3.5 - 5.1 mmol/L 5.1 4.6 5.2(H)  Chloride 98 - 111 mmol/L 99 98 99  CO2 22 - 32 mmol/L 26 25 25   Calcium 8.9 - 10.3 mg/dL 9.0 ) 4.2(P)  Total Protein 6.5 - 8.1 g/dL 5.0(L) 5.3(L) 5.2(L)  Total Bilirubin 0.3 - 1.2 mg/dL 0.7 1.0 0.9  Alkaline Phos 38 - 126 U/L 102 116 104  AST 15 - 41 U/L 64(H) 88(H) 71(H)  ALT 0 - 44 U/L 107(H) 127(H) 89(H)   CBC Latest Ref Rng & Units 09/23/2020 09/22/2020 09/21/2020  WBC 4.0 - 10.5 K/uL 26.5(H) 27.1(H) 25.8(H)  Hemoglobin 13.0 - 17.0 g/dL 12.9(L) 13.5 13.4   Hematocrit 39 - 52 % 38.6(L) 40.1 39.1  Platelets 150 - 400 K/uL 242 255 264   External labs:    Cholesterol, total 156.000 m 01/20/2020 HDL 47.000 mg 01/20/2020 LDL-C 84.000 mg 01/20/2020 Triglycerides 58.000 mg 09/12/2020  Hemoglobin 12.000 g/d 10/09/2020 Platelets 241.000 X1 10/09/2020   Creatinine, Serum 1.220 mg/ 10/09/2020 Potassium 4.200 mm 10/09/2020 ALT (SGPT) 24.000 U/L 10/09/2020  Medications and allergies  No Known Allergies   Outpatient Medications Prior to Visit  Medication Sig Dispense Refill  . atorvastatin (LIPITOR) 80 MG tablet Take 80 mg by mouth daily.    . Cholecalciferol (VITAMIN D3) 50 MCG (2000 UT) CAPS Take by mouth.    . Multiple Vitamins-Minerals (CENTRUM SILVER 50+MEN PO) Take 1 tablet by mouth daily.    10/11/2020 amLODipine (NORVASC) 10 MG tablet Take 1 tablet (10 mg total) by mouth daily. 30 tablet 11  . aspirin 81 MG chewable tablet Chew 81 mg by mouth daily.    Marland Kitchen albuterol (VENTOLIN HFA) 108 (90 Base) MCG/ACT inhaler Inhale 2 puffs into the lungs every 6 (six) hours as needed for wheezing or shortness of breath. 6.7 g 0  . aspirin EC 81 MG tablet Take 1 tablet (81 mg total) by mouth daily. 30 tablet 0  . ondansetron (ZOFRAN) 4 MG tablet Take 4 mg by mouth at bedtime.    Marland Kitchen oxymetazoline (AFRIN) 0.05 % nasal spray Place 3 sprays into both nostrils 2 (two) times daily as needed for congestion (EPISTAXIS). 30 mL 0  . pantoprazole (PROTONIX) 40 MG tablet Take 1 tablet (40 mg total) by mouth daily. 30 tablet 0   No facility-administered medications prior to visit.    Radiology:   Coronary CTA 09/12/2020: 1. Cardiovascular: There is no cardiomegaly or pericardial effusion. There is 3 vessel coronary vascular calcification. Moderate atherosclerotic calcification of the thoracic aorta. No aneurysmal dilatation or dissection.  2. Multifocal pneumonia, likely viral or atypical in etiology. Clinical correlation and follow-up to resolution recommended. 3. Small  bilateral pleural effusions.  Cardiac Studies:   Echocardiogram 09/13/2020:   1. Left ventricular ejection fraction, by estimation, is 55 to 60%. The left ventricle has normal function. The left ventricle has no regional wall motion abnormalities. There is mild concentric left ventricular hypertrophy. Left ventricular diastolic  parameters were normal.  2. Right ventricular systolic function is normal. The right ventricular size is normal. There is normal pulmonary artery systolic pressure. The estimated right ventricular systolic pressure is 24.5 mmHg.  3. A small pericardial effusion is present. There is no  evidence of cardiac tamponade.  4. The mitral valve is normal in structure. Trivial mitral valve regurgitation. No evidence of mitral stenosis.  5. The aortic valve is tricuspid. There is mild calcification of the aortic valve. Aortic valve regurgitation is not visualized. Mild aortic valve sclerosis is present, with no evidence of aortic valve stenosis.  6. The inferior vena cava is normal in size with greater than 50% respiratory variability, suggesting right atrial pressure of 3 mmHg.  EKG:      EKG 10/25/2020: Sinus tachycardia at rate of 103 beats minute, normal axis, no evidence of ischemia, otherwise normal EKG.     Assessment     ICD-10-CM   1. Shortness of breath  R06.02 EKG 12-Lead    PCV MYOCARDIAL PERFUSION WITH LEXISCAN  2. Primary hypertension  I10 metoprolol succinate (TOPROL-XL) 25 MG 24 hr tablet  3. 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 Hypercholesteremia  E78.00   5. Muscle spasm of back  M62.830 baclofen (LIORESAL) 10 MG tablet     Medications Discontinued During This Encounter  Medication Reason  . aspirin EC 81 MG tablet Change in therapy  . pantoprazole (PROTONIX) 40 MG tablet Completed Course  . ondansetron (ZOFRAN) 4 MG tablet No longer needed (for PRN  medications)  . oxymetazoline (AFRIN) 0.05 % nasal spray Patient Preference  . albuterol (VENTOLIN HFA) 108 (90 Base) MCG/ACT inhaler Patient Preference  . amLODipine (NORVASC) 10 MG tablet Change in therapy  . aspirin 81 MG chewable tablet   . aspirin EC 81 MG tablet Change in therapy    Meds ordered this encounter  Medications  . metoprolol succinate (TOPROL-XL) 25 MG 24 hr tablet    Sig: Take 1 tablet (25 mg total) by mouth daily. Take with or immediately following a meal.    Dispense:  30 tablet    Refill:  2  . baclofen (LIORESAL) 10 MG tablet    Sig: Take 1 tablet (10 mg total) by mouth 3 (three) times daily.    Dispense:  30 each    Refill:  0  . aspirin 81 MG chewable tablet    Sig: Chew 1 tablet (81 mg total) by mouth daily.    Recommendations:   Larry Olsen is a 79 y.o. Caucasian male with hypertension, hyperlipidemia, had Covid pneumonia sometime in the beginning of September 2021 but was admitted to the hospital on 09/12/2020 with hypoxemic respiratory failure and Covid pneumonia.  He had a small pericardial effusion while in the hospital with preserved LVEF by echocardiogram on 09/13/2020, he is now referred to me for evaluation of dyspnea and exclude cardiac etiology.  He was discharged home on home oxygen but fortunately after 3 to 4 days of using oxygen, he is now off of home oxygen and has been able to ambulate in the hallway in his house without significant discomfort.  He still continues to have shortness of breath with minimal activity.  Reviewed his labs, renal function is improved and is back to baseline, blood pressure is well controlled in fact blood pressure has been very low.  He had developed significant leg edema on 10 mg of amlodipine which has been reduced to 5 mg by Dr. Tenny Craw, I will go ahead and discontinue this as he is tachycardic and would like to use low-dose beta-blocker therapy.  We will start him on metoprolol succinate 25 mg daily.  Also  he has  severe three-vessel coronary calcification noted on the CT scan.  Hence advised him to continue aspirin at 81 mg daily.  Schedule for a Lexiscan Nuclear stress test to evaluate for myocardial ischemia. Patient unable to do treadmill stress testing due to dyspnea and deconditioning.  Otherwise from cardiac standpoint, he is on appropriate medical therapy with high-dose, high intensity statin therapy and lipids are well controlled.  With regard to pericardial effusion, there is no JVD, he has got minimal trace ankle edema which is probably related to amlodipine but otherwise no clinical evidence of heart failure.  Do not think he needs repeat echocardiogram.  Underlying sinus tachycardia probably related to recent pneumonia and deconditioning.  With regard to back spasm, I prescribed him baclofen 10 mg 3 times daily to be used on a as needed basis after 3 to 4 days of use.  He will continue using Tylenol as needed.  I would like to see him back in 4 weeks for follow-up.   Larry DecampJay Latissa Frick, MD, [facility] 10/25/2020, 12:15 PM Office: [phone]

## 2020-11-06 ENCOUNTER — Ambulatory Visit: Payer: Medicare Other

## 2020-11-06 ENCOUNTER — Other Ambulatory Visit: Payer: Self-pay

## 2020-11-06 DIAGNOSIS — I2510 Atherosclerotic heart disease of native coronary artery without angina pectoris: Secondary | ICD-10-CM

## 2020-11-06 DIAGNOSIS — R0602 Shortness of breath: Secondary | ICD-10-CM

## 2020-11-15 NOTE — Progress Notes (Signed)
Normal stress

## 2020-11-20 NOTE — Progress Notes (Signed)
Called patient, left stress test results in a message on Vmbox.

## 2020-11-27 ENCOUNTER — Other Ambulatory Visit: Payer: Self-pay

## 2020-11-27 ENCOUNTER — Ambulatory Visit: Payer: Medicare Other | Admitting: Cardiology

## 2020-11-27 ENCOUNTER — Encounter: Payer: Self-pay | Admitting: Cardiology

## 2020-11-27 VITALS — BP 140/67 | HR 73 | Resp 16 | Ht 65.0 in | Wt 137.0 lb

## 2020-11-27 DIAGNOSIS — I2510 Atherosclerotic heart disease of native coronary artery without angina pectoris: Secondary | ICD-10-CM

## 2020-11-27 DIAGNOSIS — R0602 Shortness of breath: Secondary | ICD-10-CM

## 2020-11-27 DIAGNOSIS — E7800 Pure hypercholesterolemia, unspecified: Secondary | ICD-10-CM

## 2020-11-27 DIAGNOSIS — M62830 Muscle spasm of back: Secondary | ICD-10-CM

## 2020-11-27 DIAGNOSIS — I10 Essential (primary) hypertension: Secondary | ICD-10-CM

## 2020-11-27 NOTE — Progress Notes (Signed)
Primary Physician/Referring:  Daisy Floro, MD  Patient ID: Larry Olsen, male    DOB: 1941-10-22, 80 y.o.   MRN: [id number]  Chief Complaint  Patient presents with  . Hypertension  . Shortness of Breath  . Follow-up    4 weeks   HPI:    Larry Olsen  is a 80 y.o. Caucasian male with hypertension, hyperlipidemia, had Covid pneumonia sometime in the beginning of September 2021 but was admitted to the hospital on 09/12/2020 with hypoxemic respiratory failure and Covid pneumonia.  He had a small pericardial effusion while in the hospital with preserved LVEF by echocardiogram on 09/13/2020.  His dyspnea is improving, he is now improved and increased his physical activity significantly since last office visit.  However he still continues to have back spasm.  He could not tolerate baclofen.  He is not having any further cough, he has not had any chest pain.  On his last office visit I had started him on metoprolol for sinus tachycardia and for coronary calcification which he is tolerating and states that his blood pressure is under excellent control.  Accompanied by his daughter.   Past Medical History:  Diagnosis Date  . HTN (hypertension)    Past Surgical History:  Procedure Laterality Date  . PROSTATE SURGERY     History reviewed. No pertinent family history.  Social History   Tobacco Use  . Smoking status: Never Smoker  . Smokeless tobacco: Former Neurosurgeon    Types: Chew  Substance Use Topics  . Alcohol use: Not Currently   Marital Status: Widowed  ROS  Review of Systems  Cardiovascular: Positive for dyspnea on exertion (mild and improved). Negative for chest pain and leg swelling.  Musculoskeletal: Positive for back pain.  Gastrointestinal: Negative for melena.   Objective  Blood pressure 140/67, pulse 73, resp. rate 16, height 5\' 5"  (1.651 m), weight 137 lb (62.1 kg), SpO2 97 %.  Vitals with BMI 11/27/2020 10/25/2020 09/24/2020  Height 5\' 5"  5\' 5"  -  Weight 137  lbs 135 lbs -  BMI 22.8 22.47 -  Systolic 140 123 13/05/2020  Diastolic 67 73 68  Pulse 73 103 100     Physical Exam Cardiovascular:     Rate and Rhythm: Normal rate and regular rhythm.     Pulses: Intact distal pulses.     Heart sounds: Normal heart sounds. No murmur heard. No gallop.      Comments: No leg edema, no JVD. Pulmonary:     Effort: Pulmonary effort is normal.     Breath sounds: Normal breath sounds.  Abdominal:     General: Bowel sounds are normal.     Palpations: Abdomen is soft.  Musculoskeletal:     Lumbar back: Spasms (right lower back, focal) and tenderness present.    Laboratory examination:   Recent Labs    09/21/20 0206 09/22/20 0051 09/23/20 0426  NA 132* 133* 133*  K 5.2* 4.6 5.1  CL 99 98 99  CO2 25 25 26   GLUCOSE 152* 157* 209*  BUN 43* 44* 46*  CREATININE 1.46* 1.60* 1.55*  CALCIUM 8.6* 8.8* 9.0  GFRNONAA 48* 43* 45*   CrCl cannot be calculated (Patient's most recent lab result is older than the maximum 21 days allowed.).  CMP Latest Ref Rng & Units 09/23/2020 09/22/2020 09/21/2020  Glucose 70 - 99 mg/dL ) 13/04/2020) 13/03/2020)  BUN 8 - 23 mg/dL 13/02/2020) 734(K) 876(O)  Creatinine 0.61 - 1.24 mg/dL 115(B) 26(O) 03(T)  Sodium 135 - 145 mmol/L 133(L) 133(L) 132(L)  Potassium 3.5 - 5.1 mmol/L 5.1 4.6 5.2(H)  Chloride 98 - 111 mmol/L 99 98 99  CO2 22 - 32 mmol/L 26 25 25   Calcium 8.9 - 10.3 mg/dL 9.0 ) 5.0(P)  Total Protein 6.5 - 8.1 g/dL 5.0(L) 5.3(L) 5.2(L)  Total Bilirubin 0.3 - 1.2 mg/dL 0.7 1.0 0.9  Alkaline Phos 38 - 126 U/L 102 116 104  AST 15 - 41 U/L 64(H) 88(H) 71(H)  ALT 0 - 44 U/L 107(H) 127(H) 89(H)   CBC Latest Ref Rng & Units 09/23/2020 09/22/2020 09/21/2020  WBC 4.0 - 10.5 K/uL 26.5(H) 27.1(H) 25.8(H)  Hemoglobin 13.0 - 17.0 g/dL 12.9(L) 13.5 13.4  Hematocrit 39.0 - 52.0 % 38.6(L) 40.1 39.1  Platelets 150 - 400 K/uL 242 255 264   External labs:    Cholesterol, total 156.000 m 01/20/2020 HDL 47.000 mg 01/20/2020 LDL-C 84.000 mg  01/20/2020 Triglycerides 58.000 mg 09/12/2020  Hemoglobin 12.000 g/d 10/09/2020 Platelets 241.000 X1 10/09/2020   Creatinine, Serum 1.220 mg/ 10/09/2020 Potassium 4.200 mm 10/09/2020 ALT (SGPT) 24.000 U/L 10/09/2020  Medications and allergies  No Known Allergies   Current Outpatient Medications on File Prior to Visit  Medication Sig Dispense Refill  . aspirin 81 MG chewable tablet Chew 1 tablet (81 mg total) by mouth daily.    10/11/2020 atorvastatin (LIPITOR) 80 MG tablet Take 80 mg by mouth daily.    . Cholecalciferol (VITAMIN D3) 50 MCG (2000 UT) CAPS Take by mouth.    . metoprolol succinate (TOPROL-XL) 25 MG 24 hr tablet Take 1 tablet (25 mg total) by mouth daily. Take with or immediately following a meal. 30 tablet 2  . Multiple Vitamins-Minerals (CENTRUM SILVER 50+MEN PO) Take 1 tablet by mouth daily.     No current facility-administered medications on file prior to visit.     Radiology:   Coronary CTA 09/12/2020: 1. Cardiovascular: There is no cardiomegaly or pericardial effusion. There is 3 vessel coronary vascular calcification. Moderate atherosclerotic calcification of the thoracic aorta. No aneurysmal dilatation or dissection.  2. Multifocal pneumonia, likely viral or atypical in etiology. Clinical correlation and follow-up to resolution recommended. 3. Small bilateral pleural effusions.  Cardiac Studies:   Echocardiogram 09/13/2020:   1. Left ventricular ejection fraction, by estimation, is 55 to 60%. The left ventricle has normal function. The left ventricle has no regional wall motion abnormalities. There is mild concentric left ventricular hypertrophy. Left ventricular diastolic  parameters were normal.  2. Right ventricular systolic function is normal. The right ventricular size is normal. There is normal pulmonary artery systolic pressure. The estimated right ventricular systolic pressure is 24.5 mmHg.  3. A small pericardial effusion is present. There is no evidence of  cardiac tamponade.  4. The mitral valve is normal in structure. Trivial mitral valve regurgitation. No evidence of mitral stenosis.  5. The aortic valve is tricuspid. There is mild calcification of the aortic valve. Aortic valve regurgitation is not visualized. Mild aortic valve sclerosis is present, with no evidence of aortic valve stenosis.  6. The inferior vena cava is normal in size with greater than 50% respiratory variability, suggesting right atrial pressure of 3 mmHg.  Lexiscan Sestamibi stress test 11/06/2020: Lexiscan nuclear stress test performed using 1-day protocol. Normal myocardial perfusion. Stress LVEF 67%. Low risk study.  EKG:      EKG 10/25/2020: Sinus tachycardia at rate of 103 beats minute, normal axis, no evidence of ischemia, otherwise normal EKG.     Assessment  ICD-10-CM   1. Shortness of breath  R06.02   2. Primary hypertension  I10   3. Muscle spasm of back  M62.830   4. Hypercholesteremia  E78.00   5. 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 baclofen (LIORESAL) 10 MG tablet Patient Preference    No orders of the defined types were placed in this encounter.   Recommendations:   Larry Olsen is a 80 y.o. Caucasian male with hypertension, hyperlipidemia, had Covid pneumonia sometime in the beginning of September 2021 but was admitted to the hospital on 09/12/2020 with hypoxemic respiratory failure and Covid pneumonia.  He had a small pericardial effusion while in the hospital with preserved LVEF by echocardiogram on 09/13/2020.  Due to shortness of breath and coronary calcification noted on the CT scan he underwent nuclear stress test.  His nuclear stress test completely normal and low risk.  I had also discontinued amlodipine and switch him to metoprolol, his heart rate is improved significantly and he is not tachycardic anymore.  He remains asymptomatic and  brings home blood pressure recordings which are around 120 mmHg systolic.  Hence I did not make any changes to his medications.  Advised him to follow-up with his PCP for further management and evaluation.  As he has coronary calcification, hyperlipidemia, he is on aspirin and statin, continue the same. Still has back spasm and he would like to try Ibuprofen advised him 4 mg 3 times daily for no greater than 1 week in view of renal insufficiency.  Patient and his daughter are aware of the side effects.  He will continue to use Tylenol and if symptoms are not improved to discuss with his PCP.  I had tried baclofen on his last office visit which he did not tolerate as he felt extremely dizzy after taking 2nd dose.    Yates Decamp, MD, [facility] 11/27/2020, 4:28 PM Office: [phone]

## 2022-03-11 IMAGING — CT CT ANGIO CHEST
2 of 6 series · 18 of 46 positions shown · IV contrast (omnipaque)
Comparison: Chest radiograph dated 09/12/2020.

CLINICAL DATA: 79-year-old male with elevated D-dimer. Concern for
pulmonary embolism.

EXAM:
CT ANGIOGRAPHY CHEST WITH CONTRAST
TECHNIQUE: Multidetector CT imaging of the chest was performed using the
standard protocol during bolus administration of intravenous
contrast. Multiplanar CT image reconstructions and MIPs were
obtained to evaluate the vascular anatomy.
CONTRAST:  75mL OMNIPAQUE IOHEXOL 350 MG/ML SOLN

[Series 6: thins · axial · 0.79mm/px · z∈[+1159,+1384]mm · 15 of 247 slices shown]
[im 11/247  lung]
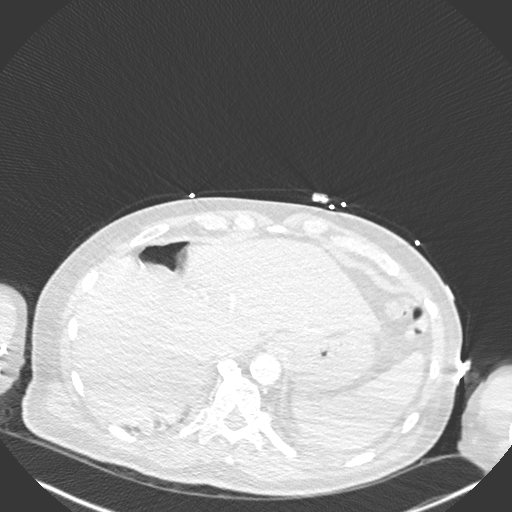
[im 33/247  soft-tissue]
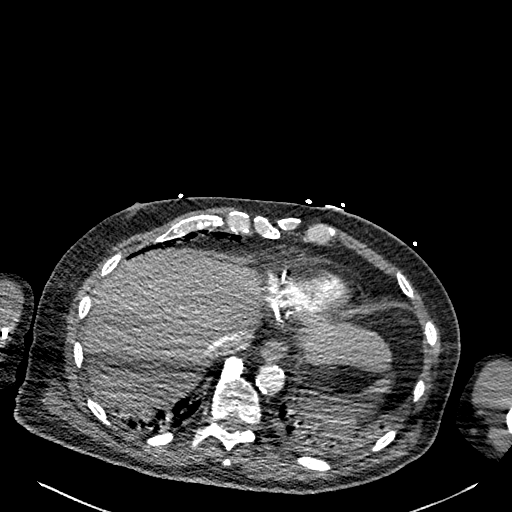
[im 43/247  lung]
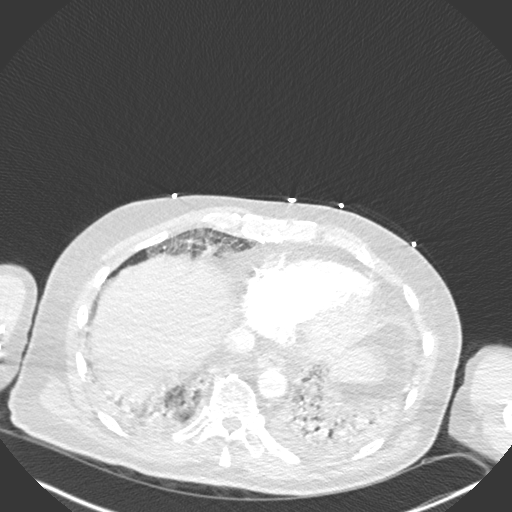
[im 65/247  soft-tissue]
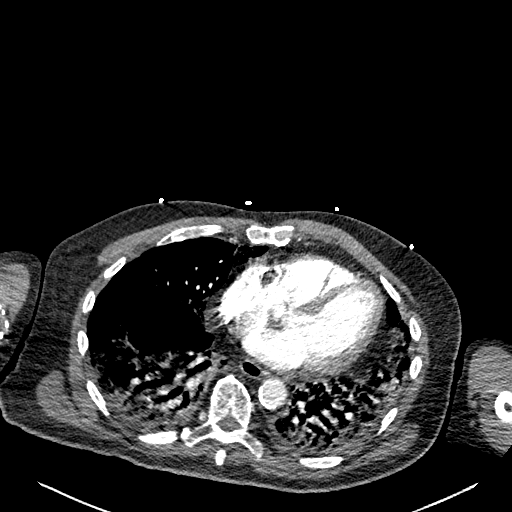
[im 75/247  lung]
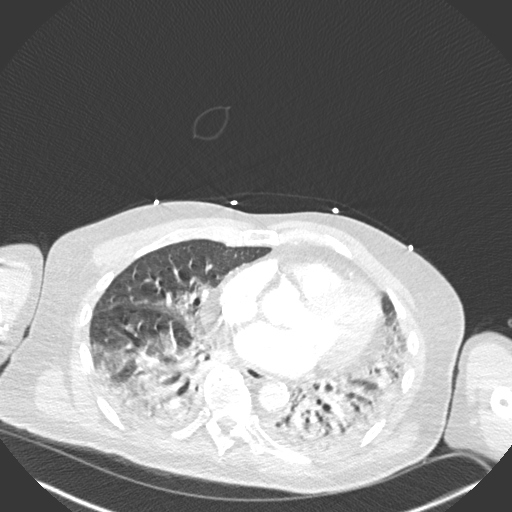
[im 97/247  soft-tissue]
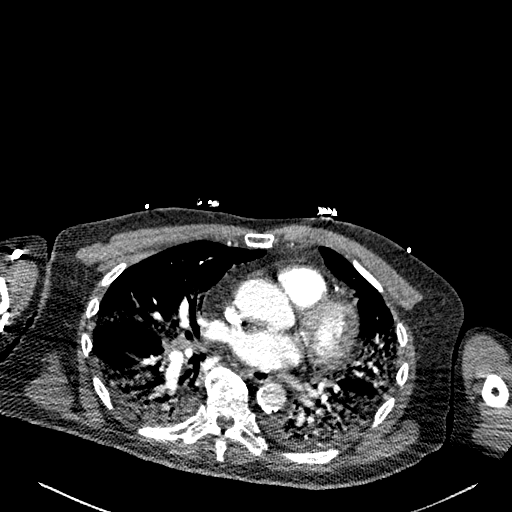
[im 107/247  lung]
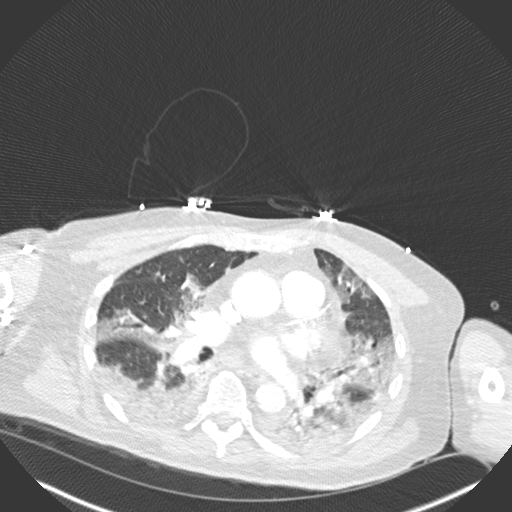
[im 129/247  soft-tissue]
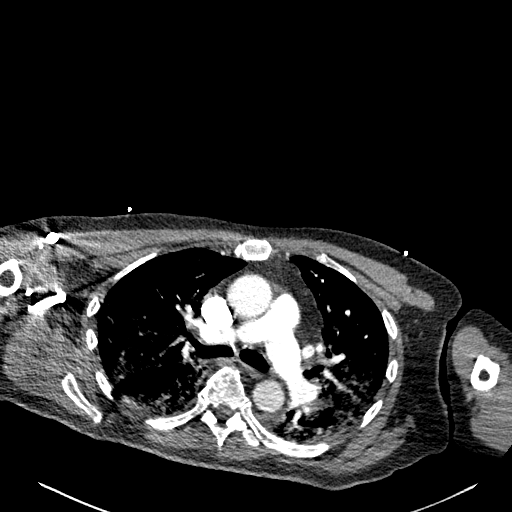
[im 140/247  lung]
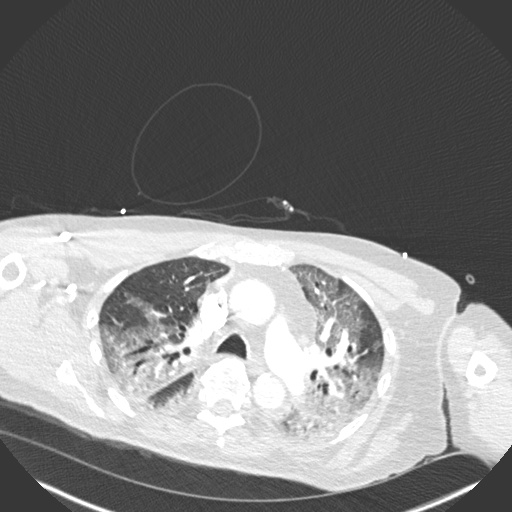
[im 150/247  soft-tissue]
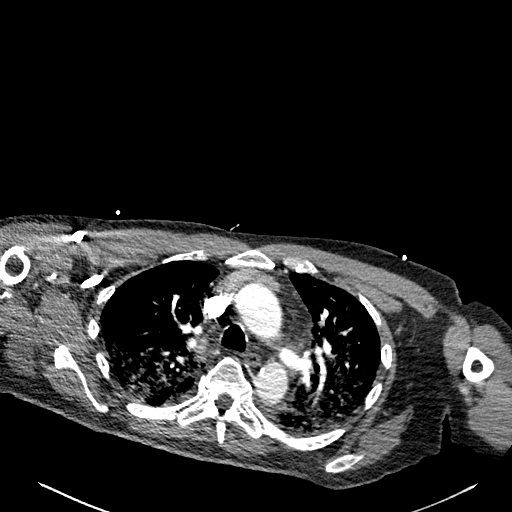
[im 172/247  lung]
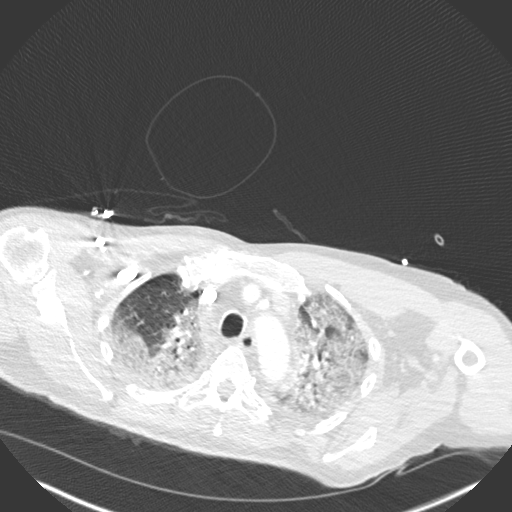
[im 182/247  soft-tissue]
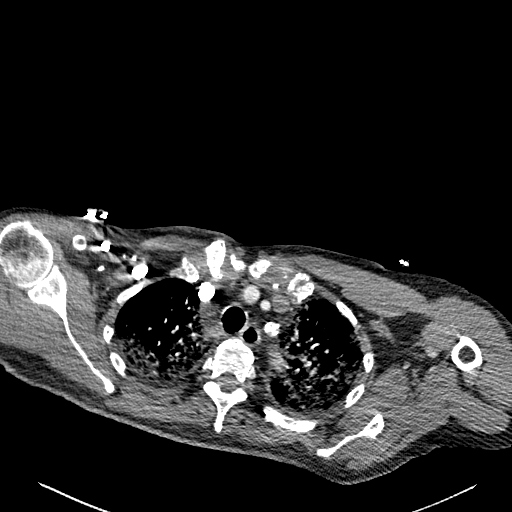
[im 204/247  lung]
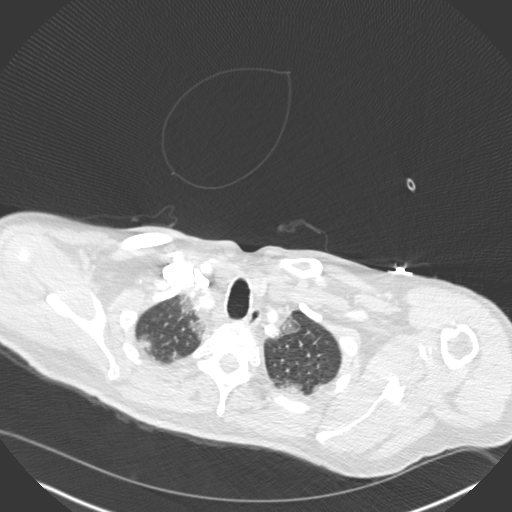
[im 214/247  soft-tissue]
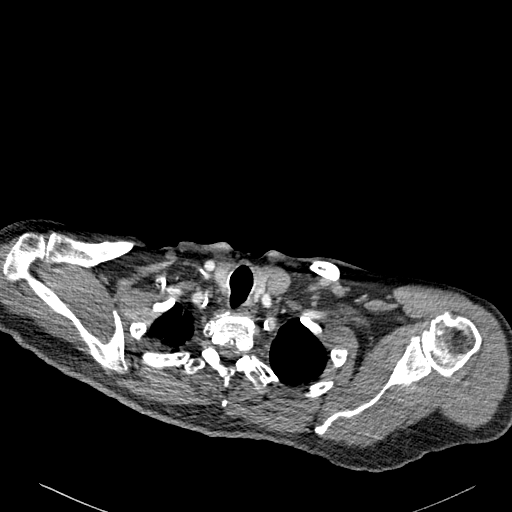
[im 236/247  lung]
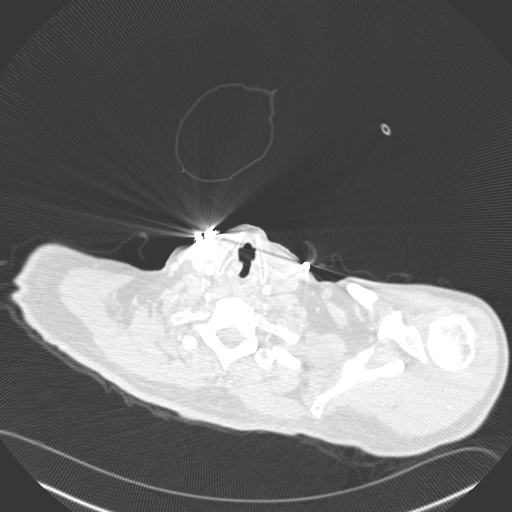

[Series 8: coronal mpr · coronal · 0.59mm/px · 3 of 151 slices shown]
[im 38/151  soft-tissue]
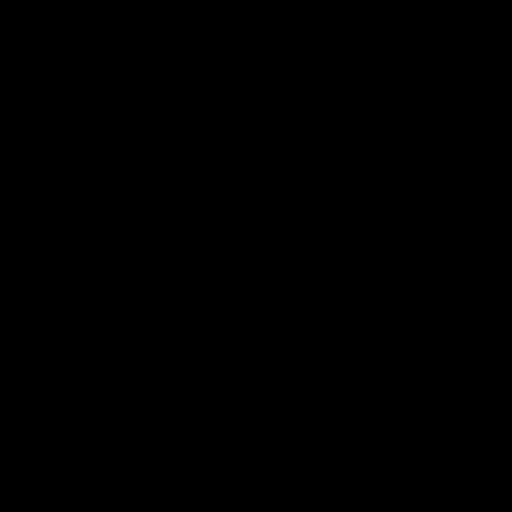
[im 76/151  soft-tissue]
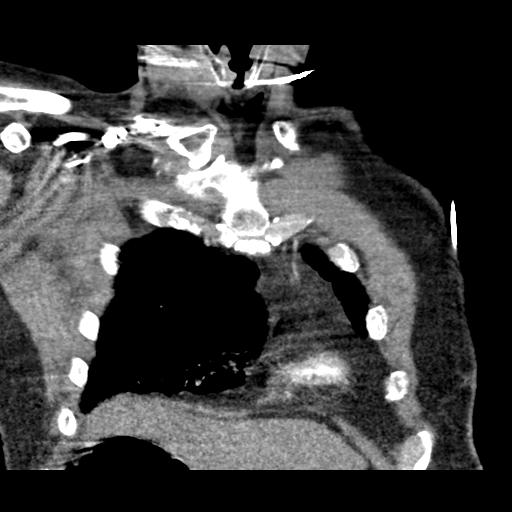
[im 113/151  soft-tissue]
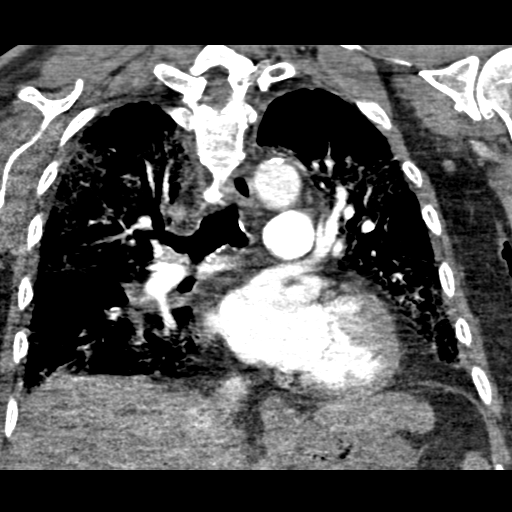

[18 of 46 positions shown; findings below may reference images not displayed]

FINDINGS: Evaluation of this exam is limited due to respiratory motion
artifact.

Cardiovascular: There is no cardiomegaly or pericardial effusion.
There is 3 vessel coronary vascular calcification. Moderate
atherosclerotic calcification of the thoracic aorta. No aneurysmal
dilatation or dissection. The origins of the great vessels of the
aortic arch appear patent as visualized. Evaluation of the pulmonary
arteries is limited due to respiratory motion artifact as well as
streak artifact caused by patient's arms. No large or central
pulmonary artery embolus identified.

Mediastinum/Nodes: No hilar or mediastinal adenopathy. The esophagus
and the thyroid gland are grossly unremarkable. No mediastinal fluid
collection.

Lungs/Pleura: Extensive patchy bilateral pulmonary opacities with
predominant involvement of the lower lobes most consistent with
multifocal pneumonia, likely viral or atypical in etiology including
[id number]. Clinical correlation is recommended. There are small
bilateral pleural effusions. No pneumothorax. The central airways
are patent.

Upper Abdomen: No acute abnormality.

Musculoskeletal: Degenerative changes of the spine. No acute osseous
pathology.

Review of the MIP images confirms the above findings.
IMPRESSION: 1. No CT evidence of central pulmonary artery embolus.
2. Multifocal pneumonia, likely viral or atypical in etiology.
Clinical correlation and follow-up to resolution recommended.
3. Small bilateral pleural effusions.
4. Aortic Atherosclerosis (PMN7G-V82.2).

## 2022-03-14 IMAGING — DX DG CHEST 1V PORT SAME DAY
1 series · 1 of 1 positions shown · non-contrast
Comparison: Chest radiograph and chest CT September 12, 2020

CLINICAL DATA: Shortness of breath.  OCEUS-PY positive

EXAM:
PORTABLE CHEST 1 VIEW

[chest ap]
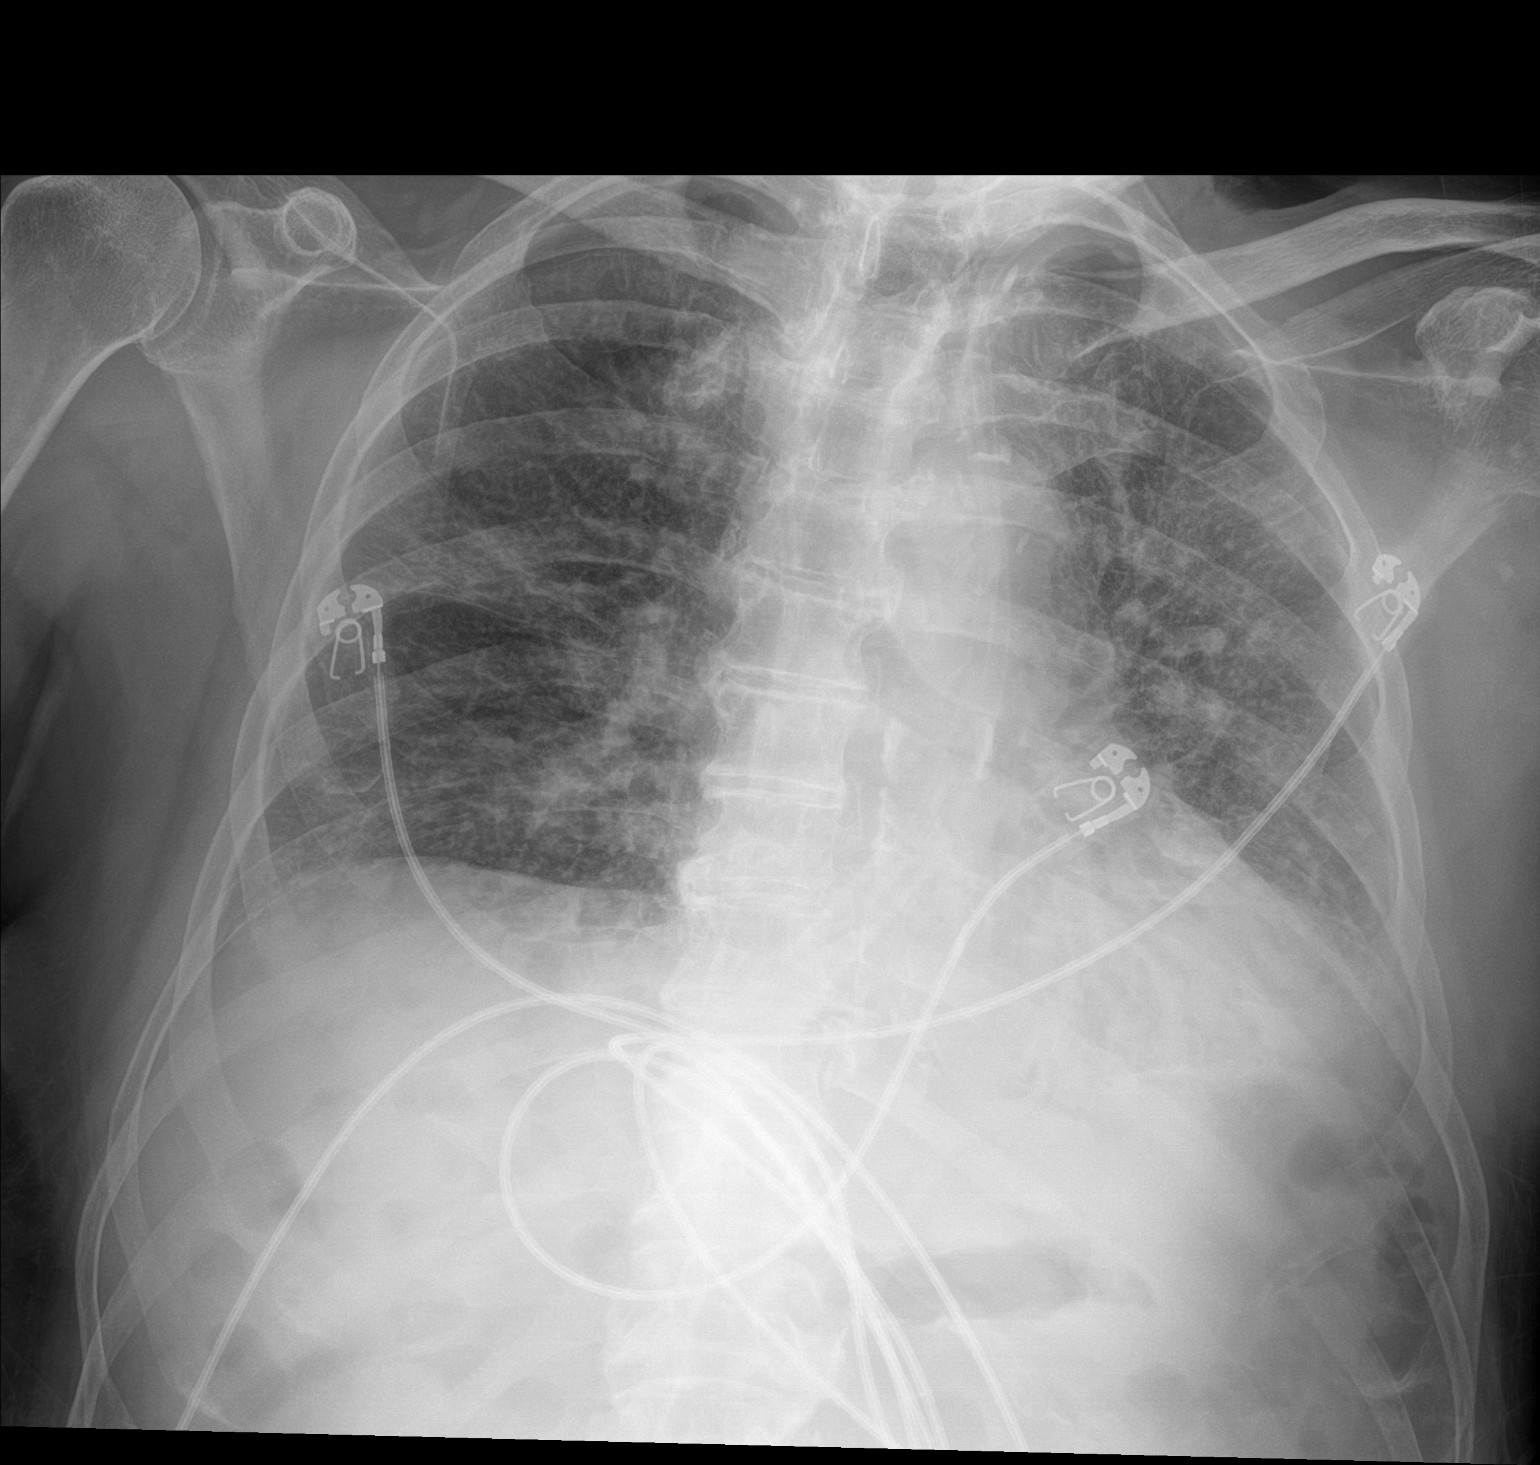

[1 of 1 positions shown; findings below may reference images not displayed]

FINDINGS: There is patchy airspace opacity throughout the lungs bilaterally,
similar to recent prior studies. No consolidation. Heart size and
pulmonary vascularity within normal limits. No adenopathy. There is
aortic atherosclerosis. No bone lesions
IMPRESSION: Persistent multifocal airspace opacity consistent with atypical
organism pneumonia. No appreciable new opacity compared to recent
studies. Stable cardiac silhouette.

Aortic Atherosclerosis (D99NI-RW2.2).

## 2022-10-24 ENCOUNTER — Emergency Department (HOSPITAL_COMMUNITY): Payer: Medicare Other

## 2022-10-24 ENCOUNTER — Inpatient Hospital Stay (HOSPITAL_COMMUNITY)
Admission: EM | Admit: 2022-10-24 | Discharge: 2022-10-27 | DRG: 853 | Disposition: A | Discharge status: Home / Self Care | Payer: Medicare Other | Attending: Internal Medicine | Admitting: Internal Medicine

## 2022-10-24 ENCOUNTER — Inpatient Hospital Stay (HOSPITAL_COMMUNITY): Payer: Medicare Other

## 2022-10-24 DIAGNOSIS — I10 Essential (primary) hypertension: Secondary | ICD-10-CM | POA: Diagnosis present

## 2022-10-24 DIAGNOSIS — J9601 Acute respiratory failure with hypoxia: Secondary | ICD-10-CM | POA: Diagnosis present

## 2022-10-24 DIAGNOSIS — K828 Other specified diseases of gallbladder: Secondary | ICD-10-CM | POA: Diagnosis present

## 2022-10-24 DIAGNOSIS — K8000 Calculus of gallbladder with acute cholecystitis without obstruction: Secondary | ICD-10-CM | POA: Diagnosis present

## 2022-10-24 DIAGNOSIS — J90 Pleural effusion, not elsewhere classified: Secondary | ICD-10-CM | POA: Diagnosis present

## 2022-10-24 DIAGNOSIS — Z800 Family history of malignant neoplasm of digestive organs: Secondary | ICD-10-CM

## 2022-10-24 DIAGNOSIS — K819 Cholecystitis, unspecified: Secondary | ICD-10-CM | POA: Diagnosis not present

## 2022-10-24 DIAGNOSIS — Z7982 Long term (current) use of aspirin: Secondary | ICD-10-CM | POA: Diagnosis not present

## 2022-10-24 DIAGNOSIS — R748 Abnormal levels of other serum enzymes: Secondary | ICD-10-CM | POA: Diagnosis present

## 2022-10-24 DIAGNOSIS — N289 Disorder of kidney and ureter, unspecified: Secondary | ICD-10-CM | POA: Diagnosis not present

## 2022-10-24 DIAGNOSIS — I1310 Hypertensive heart and chronic kidney disease without heart failure, with stage 1 through stage 4 chronic kidney disease, or unspecified chronic kidney disease: Secondary | ICD-10-CM | POA: Diagnosis present

## 2022-10-24 DIAGNOSIS — R6520 Severe sepsis without septic shock: Secondary | ICD-10-CM | POA: Diagnosis present

## 2022-10-24 DIAGNOSIS — R1011 Right upper quadrant pain: Secondary | ICD-10-CM | POA: Diagnosis present

## 2022-10-24 DIAGNOSIS — R7989 Other specified abnormal findings of blood chemistry: Secondary | ICD-10-CM | POA: Diagnosis present

## 2022-10-24 DIAGNOSIS — E876 Hypokalemia: Secondary | ICD-10-CM | POA: Diagnosis present

## 2022-10-24 DIAGNOSIS — R7401 Elevation of levels of liver transaminase levels: Secondary | ICD-10-CM | POA: Diagnosis present

## 2022-10-24 DIAGNOSIS — K810 Acute cholecystitis: Secondary | ICD-10-CM | POA: Diagnosis present

## 2022-10-24 DIAGNOSIS — A419 Sepsis, unspecified organism: Principal | ICD-10-CM | POA: Diagnosis present

## 2022-10-24 DIAGNOSIS — Z79899 Other long term (current) drug therapy: Secondary | ICD-10-CM

## 2022-10-24 DIAGNOSIS — E785 Hyperlipidemia, unspecified: Secondary | ICD-10-CM | POA: Diagnosis present

## 2022-10-24 DIAGNOSIS — Z8546 Personal history of malignant neoplasm of prostate: Secondary | ICD-10-CM

## 2022-10-24 DIAGNOSIS — J479 Bronchiectasis, uncomplicated: Secondary | ICD-10-CM | POA: Diagnosis present

## 2022-10-24 DIAGNOSIS — Z9079 Acquired absence of other genital organ(s): Secondary | ICD-10-CM

## 2022-10-24 DIAGNOSIS — R1084 Generalized abdominal pain: Secondary | ICD-10-CM

## 2022-10-24 DIAGNOSIS — Z87891 Personal history of nicotine dependence: Secondary | ICD-10-CM | POA: Diagnosis not present

## 2022-10-24 DIAGNOSIS — Z8616 Personal history of COVID-19: Secondary | ICD-10-CM

## 2022-10-24 DIAGNOSIS — K5730 Diverticulosis of large intestine without perforation or abscess without bleeding: Secondary | ICD-10-CM | POA: Diagnosis present

## 2022-10-24 DIAGNOSIS — N1831 Chronic kidney disease, stage 3a: Secondary | ICD-10-CM | POA: Diagnosis present

## 2022-10-24 LAB — CBC WITH DIFFERENTIAL/PLATELET
Abs Immature Granulocytes: 0.08 10*3/uL — ABNORMAL HIGH (ref 0.00–0.07)
Basophils Absolute: 0 10*3/uL (ref 0.0–0.1)
Basophils Relative: 0 %
Eosinophils Absolute: 0 10*3/uL (ref 0.0–0.5)
Eosinophils Relative: 0 %
HCT: 44 % (ref 39.0–52.0)
Hemoglobin: 14.7 g/dL (ref 13.0–17.0)
Immature Granulocytes: 0 %
Lymphocytes Relative: 3 %
Lymphs Abs: 0.5 10*3/uL — ABNORMAL LOW (ref 0.7–4.0)
MCH: 32.4 pg (ref 26.0–34.0)
MCHC: 33.4 g/dL (ref 30.0–36.0)
MCV: 96.9 fL (ref 80.0–100.0)
Monocytes Absolute: 1.1 10*3/uL — ABNORMAL HIGH (ref 0.1–1.0)
Monocytes Relative: 6 %
Neutro Abs: 17.4 10*3/uL — ABNORMAL HIGH (ref 1.7–7.7)
Neutrophils Relative %: 91 %
Platelets: 176 10*3/uL (ref 150–400)
RBC: 4.54 MIL/uL (ref 4.22–5.81)
RDW: 12.6 % (ref 11.5–15.5)
WBC: 19.1 10*3/uL — ABNORMAL HIGH (ref 4.0–10.5)
nRBC: 0 % (ref 0.0–0.2)

## 2022-10-24 LAB — I-STAT CHEM 8, ED
BUN: 28 mg/dL — ABNORMAL HIGH (ref 8–23)
Calcium, Ion: 1.11 mmol/L — ABNORMAL LOW (ref 1.15–1.40)
Chloride: 105 mmol/L (ref 98–111)
Creatinine, Ser: 1.4 mg/dL — ABNORMAL HIGH (ref 0.61–1.24)
Glucose, Bld: 217 mg/dL — ABNORMAL HIGH (ref 70–99)
HCT: 40 % (ref 39.0–52.0)
Hemoglobin: 13.6 g/dL (ref 13.0–17.0)
Potassium: 4.2 mmol/L (ref 3.5–5.1)
Sodium: 138 mmol/L (ref 135–145)
TCO2: 20 mmol/L — ABNORMAL LOW (ref 22–32)

## 2022-10-24 LAB — URINALYSIS, ROUTINE W REFLEX MICROSCOPIC
Bilirubin Urine: NEGATIVE
Glucose, UA: >=500 mg/dL — AB
Hgb urine dipstick: NEGATIVE
Ketones, ur: NEGATIVE mg/dL
Leukocytes,Ua: NEGATIVE
Nitrite: NEGATIVE
Protein, ur: NEGATIVE mg/dL
Specific Gravity, Urine: 1.01 (ref 1.005–1.030)
pH: 6 (ref 5.0–8.0)

## 2022-10-24 LAB — BLOOD GAS, VENOUS
Acid-base deficit: 2.6 mmol/L — ABNORMAL HIGH (ref 0.0–2.0)
Bicarbonate: 23.2 mmol/L (ref 20.0–28.0)
O2 Saturation: 48.2 %
Patient temperature: 37
pCO2, Ven: 43 mmHg — ABNORMAL LOW (ref 44–60)
pH, Ven: 7.34 (ref 7.25–7.43)
pO2, Ven: 33 mmHg (ref 32–45)

## 2022-10-24 LAB — COMPREHENSIVE METABOLIC PANEL
ALT: 400 U/L — ABNORMAL HIGH (ref 0–44)
AST: 606 U/L — ABNORMAL HIGH (ref 15–41)
Albumin: 3.4 g/dL — ABNORMAL LOW (ref 3.5–5.0)
Alkaline Phosphatase: 144 U/L — ABNORMAL HIGH (ref 38–126)
Anion gap: 9 (ref 5–15)
BUN: 27 mg/dL — ABNORMAL HIGH (ref 8–23)
CO2: 18 mmol/L — ABNORMAL LOW (ref 22–32)
Calcium: 7.4 mg/dL — ABNORMAL LOW (ref 8.9–10.3)
Chloride: 112 mmol/L — ABNORMAL HIGH (ref 98–111)
Creatinine, Ser: 1.32 mg/dL — ABNORMAL HIGH (ref 0.61–1.24)
GFR, Estimated: 54 mL/min — ABNORMAL LOW (ref >60)
Glucose, Bld: 193 mg/dL — ABNORMAL HIGH (ref 70–99)
Potassium: 3.3 mmol/L — ABNORMAL LOW (ref 3.5–5.1)
Sodium: 139 mmol/L (ref 135–145)
Total Bilirubin: 1.8 mg/dL — ABNORMAL HIGH (ref 0.3–1.2)
Total Protein: 5.7 g/dL — ABNORMAL LOW (ref 6.5–8.1)

## 2022-10-24 LAB — LIPASE, BLOOD: Lipase: 32 U/L (ref 11–51)

## 2022-10-24 LAB — LACTIC ACID, PLASMA
Lactic Acid, Venous: 3.2 mmol/L (ref 0.5–1.9)
Lactic Acid, Venous: 2.7 mmol/L (ref 0.5–1.9)

## 2022-10-24 MED ORDER — LACTATED RINGERS IV BOLUS
1000.0000 mL | Freq: Once | INTRAVENOUS | Status: AC
  Administered 2022-10-24: 1000 mL via INTRAVENOUS

## 2022-10-24 MED ORDER — ONDANSETRON HCL 4 MG/2ML IJ SOLN: 4.0000 mg | Freq: Four times a day (QID) | INTRAMUSCULAR | Status: DC | PRN

## 2022-10-24 MED ORDER — PIPERACILLIN-TAZOBACTAM 3.375 G IVPB
3.3750 g | Freq: Once | INTRAVENOUS | Status: AC
  Administered 2022-10-24: 3.375 g via INTRAVENOUS
  Filled 2022-10-24: qty 50

## 2022-10-24 MED ORDER — ONDANSETRON HCL 4 MG/2ML IJ SOLN
4.0000 mg | Freq: Once | INTRAMUSCULAR | Status: AC
  Administered 2022-10-24: 4 mg via INTRAVENOUS
  Filled 2022-10-24: qty 2

## 2022-10-24 MED ORDER — IOHEXOL 350 MG/ML SOLN
100.0000 mL | Freq: Once | INTRAVENOUS | Status: AC | PRN
  Administered 2022-10-24: 100 mL via INTRAVENOUS

## 2022-10-24 MED ORDER — SODIUM CHLORIDE (PF) 0.9 % IJ SOLN
INTRAMUSCULAR | Status: AC
  Administered 2022-10-24: 10 mL
  Filled 2022-10-24: qty 50

## 2022-10-24 MED ORDER — FENTANYL CITRATE PF 50 MCG/ML IJ SOSY
50.0000 ug | PREFILLED_SYRINGE | Freq: Once | INTRAMUSCULAR | Status: AC
  Administered 2022-10-24: 50 ug via INTRAVENOUS
  Filled 2022-10-24: qty 1

## 2022-10-24 MED ORDER — MORPHINE SULFATE (PF) 4 MG/ML IV SOLN
4.0000 mg | Freq: Once | INTRAVENOUS | Status: AC
  Administered 2022-10-24: 4 mg via INTRAVENOUS
  Filled 2022-10-24: qty 1

## 2022-10-24 MED ORDER — HYDROMORPHONE HCL 1 MG/ML IJ SOLN: 1.0000 mg | INTRAMUSCULAR | Status: DC | PRN

## 2022-10-24 MED ORDER — CALCIUM GLUCONATE-NACL 1-0.675 GM/50ML-% IV SOLN
1.0000 g | Freq: Once | INTRAVENOUS | Status: AC
  Administered 2022-10-24: 1000 mg via INTRAVENOUS
  Filled 2022-10-24: qty 50

## 2022-10-24 MED ORDER — HEPARIN SODIUM (PORCINE) 5000 UNIT/ML IJ SOLN
5000.0000 [IU] | Freq: Three times a day (TID) | INTRAMUSCULAR | Status: DC
  Administered 2022-10-24 – 2022-10-27 (×7): 5000 [IU] via SUBCUTANEOUS
  Filled 2022-10-24 (×7): qty 1

## 2022-10-24 MED ORDER — SODIUM CHLORIDE 0.9 % IV SOLN: INTRAVENOUS | Status: AC

## 2022-10-24 MED ORDER — GADOBUTROL 1 MMOL/ML IV SOLN
6.0000 mL | Freq: Once | INTRAVENOUS | Status: AC | PRN
  Administered 2022-10-24: 6 mL via INTRAVENOUS

## 2022-10-24 MED ORDER — PIPERACILLIN-TAZOBACTAM 3.375 G IVPB
3.3750 g | Freq: Three times a day (TID) | INTRAVENOUS | Status: DC
  Administered 2022-10-25 (×2): 3.375 g via INTRAVENOUS
  Filled 2022-10-24 (×2): qty 50

## 2022-10-24 MED ORDER — SODIUM CHLORIDE 0.9% FLUSH
3.0000 mL | Freq: Two times a day (BID) | INTRAVENOUS | Status: DC
  Administered 2022-10-25 (×2): 3 mL via INTRAVENOUS

## 2022-10-24 MED ORDER — POTASSIUM CHLORIDE 10 MEQ/100ML IV SOLN
10.0000 meq | INTRAVENOUS | Status: AC
  Administered 2022-10-24 – 2022-10-25 (×3): 10 meq via INTRAVENOUS
  Filled 2022-10-24 (×3): qty 100

## 2022-10-24 NOTE — Hospital Course (Addendum)
Larry Olsen is a 81 y.o. male with medical history significant for HTN, HLD, CKD stage IIIa, prostate cancer s/p prostatectomy who is admitted with sepsis and elevated LFTs.  He underwent multiple imaging studies on admission.  Ultimately there was concern for acute cholecystitis with a distended gallbladder noted and trace pericholecystic fluid found on MRCP.  No choledocholithiasis was present. General surgery was consulted and patient was recommended for cholecystectomy.

## 2022-10-24 NOTE — ED Notes (Signed)
EDP notified of lactate and liver enzymes elevated face to face. Pt to CT now. Urine sent. Rates pain 10/10.

## 2022-10-24 NOTE — H&P (Signed)
History and Physical    Larry Olsen HYW:[id number] DOB: 1941/04/19 DOA: 10/24/2022  PCP: Lawerance Cruel, MD  Patient coming from: Home  I have personally briefly reviewed patient's old medical records in [place]  Chief Complaint: Abdominal pain  HPI: Larry Olsen is a 81 y.o. male with medical history significant for HTN, HLD, CKD stage IIIa, prostate cancer s/p prostatectomy who presented to the ED for evaluation of abdominal pain.  Patient reports acute onset of epigastric abdominal pain beginning last night.  Pain has been on and off but severe.  Feels like a "fist" in his stomach.  Has had nausea without emesis.  Abdominal pain is nonradiating.  No change in urinary pattern, no dysuria.  Denies diarrhea and reports soft bowel movement earlier today.  Has had some chills.  Patient reports his younger brother died of liver cancer.  ED Course  Labs/Imaging on admission: I have personally reviewed following labs and imaging studies.  Initial vitals showed BP 152/80, pulse 86, RR 30, temp 97.6 F, SpO2 100% on room air.  Patient noted to have drop in SpO2 to high 80s on room air and placed on 2 L O2 via [place].  Labs show AST 606, ALT 400, alk phos 144, total bilirubin 1.8, sodium 139, potassium 3.3, bicarb 18, BUN 27, creatinine 1.32, serum glucose 193, calcium 7.4, albumin 3.4, WBC 19.1, hemoglobin 14.7, platelets 176,000, lactic acid 3.2 > 2.7.  Lipase 32.  Urinalysis negative for UTI.  Portable chest x-ray negative for evidence of pneumoperitoneum, cardiomegaly with small bilateral pleural effusions noted.  CTA chest/abdomen/pelvis dissection study was negative for evidence of acute aneurysm, dissection, or other aortic pathology.  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  Sigmoid diverticulosis without evidence of acute diverticulitis.  Mildly distended gallbladder noted without gallstones  gallbladder wall thickening or biliary dilatation.  Pancreas unremarkable.  RUQ ultrasound shows distended gallbladder with small amount of sludge, no shadowing stones, negative for biliary dilatation.  EDP spoke with on-call GI, Dr. Michail Sermon, who recommended MRCP and medical admission.  Patient was given IV Zosyn, 1 L LR, morphine.  The hospitalist service was consulted to admit for further evaluation and management.  Review of Systems: All systems reviewed and are negative except as documented in history of present illness above.   Past Medical History:  Diagnosis Date   HTN (hypertension)     Past Surgical History:  Procedure Laterality Date   PROSTATE SURGERY      Social History:  reports that he has never smoked. He has quit using smokeless tobacco.  His smokeless tobacco use included chew. He reports that he does not currently use alcohol. He reports that he does not use drugs.  No Known Allergies  No family history on file.   Prior to Admission medications   Medication Sig Start Date End Date Taking? Authorizing Provider  aspirin 81 MG chewable tablet Chew 1 tablet (81 mg total) by mouth daily. Patient taking differently: Chew 81 mg by mouth daily with lunch. 10/25/20  Yes Adrian Prows, MD  atorvastatin (LIPITOR) 80 MG tablet Take 80 mg by mouth daily with lunch. 08/19/20  Yes [provider]  Cholecalciferol (VITAMIN D3) 50 MCG (2000 UT) CAPS Take 2,000 Units by mouth daily with lunch.   Yes [provider]  metoprolol succinate (TOPROL-XL) 25 MG 24 hr tablet Take 1 tablet (25 mg total) by mouth daily. Take with or immediately following  a meal. Patient taking differently: Take 25 mg by mouth in the morning. Take with or immediately following a meal. 10/25/20 10/24/22 Yes Adrian Prows, MD  Multiple Vitamins-Minerals (CENTRUM SILVER 50+MEN PO) Take 1 tablet by mouth daily with breakfast.   Yes [provider]    Physical Exam: Vitals:   10/24/22 1830  10/24/22 1900 10/24/22 1930 10/24/22 2000  BP: (!) 171/88 (!) 153/70 117/73 120/60  Pulse: 86 91 89 83  Resp: (!) 38 (!) 26 20 (!) 22  Temp:    98.2 F (36.8 C)  TempSrc:    Oral  SpO2: 93% 94% 97% 96%   Constitutional: Resting in bed, NAD, calm, comfortable Eyes: EOMI, lids and conjunctivae normal ENMT: Mucous membranes are dry. Posterior pharynx clear of any exudate or lesions.Normal dentition.  Neck: normal, supple, no masses. Respiratory: clear to auscultation bilaterally, no wheezing, no crackles. Normal respiratory effort. No accessory muscle use.  Cardiovascular: Regular rate and rhythm, no murmurs / rubs / gallops. No extremity edema. 2+ pedal pulses. Abdomen: Epigastric tenderness, no masses palpated. No hepatosplenomegaly. Musculoskeletal: no clubbing / cyanosis. No joint deformity upper and lower extremities. Good ROM, no contractures. Normal muscle tone.  Skin: no rashes, lesions, ulcers. No induration Neurologic:  Sensation intact. Strength 5/5 in all 4.  Psychiatric: Alert and oriented x 3. Normal mood.   EKG: Not performed.  Assessment/Plan Principal Problem:   Sepsis ([place]) Active Problems:   Elevated LFTs   Acute respiratory failure with hypoxia (HCC)   Chronic kidney disease, stage 3a ([place])   Essential hypertension   Hyperlipidemia   Hypokalemia   Hypocalcemia   Larry Olsen is a 81 y.o. male with medical history significant for HTN, HLD, CKD stage IIIa, prostate cancer s/p prostatectomy who is admitted with sepsis and elevated LFTs.  Assessment and Plan: * Sepsis ([place]) Presenting with leukocytosis, tachypnea, elevated LFTs, lactic acidosis with presumed intra-abdominal infectious source. -Continue empiric IV Zosyn -Follow blood cultures -MRCP ordered and pending -Continue IV fluid hydration overnight -Keep n.p.o. for now  Acute respiratory failure with hypoxia (HCC) SpO2 dropping into the high 80s while in the ED, stable on 2 L O2 via [place].  CT  imaging suggestive of chronic aspiration -Continue supplemental O2 and wean as able -On IV Zosyn as above -Incentive spirometer  Elevated LFTs CT shows mildly distended gallbladder, no stones or biliary dilatation.  RUQ ultrasound shows distended gallbladder with small amount of sludge, also no stones or biliary dilatation. -Follow MRCP -Acute hepatitis panel  Chronic kidney disease, stage 3a ([place]) Renal function stable.  Continue to monitor.  Hypocalcemia Corrected calcium 7.9.  Giving IV calcium gluconate.  Hypokalemia IV supplement ordered.  Hyperlipidemia Holding statin due to elevated LFTs.  Essential hypertension Hold Toprol-XL for now as he has had some low blood pressures.  DVT prophylaxis: heparin injection 5,000 Units Start: 10/24/22 2200 Code Status: Full code, confirmed with patient on admission Family Communication: Daughter at bedside Disposition Plan: From home, dispo pending clinical progress Consults called: EDP discussed with [facility] Dr. Michail Sermon Severity of Illness: The appropriate patient status for this patient is INPATIENT. Inpatient status is judged to be reasonable and necessary in order to provide the required intensity of service to ensure the patient's safety. The patient's presenting symptoms, physical exam findings, and initial radiographic and laboratory data in the context of their chronic comorbidities is felt to place them at high risk for further clinical deterioration. Furthermore, it is not anticipated that the patient will  be medically stable for discharge from the hospital within 2 midnights of admission.   * I certify that at the point of admission it is my clinical judgment that the patient will require inpatient hospital care spanning beyond 2 midnights from the point of admission due to high intensity of service, high risk for further deterioration and high frequency of surveillance required.Zada Finders MD [facility]  If  7PM-7AM, please contact night-coverage [email or web address]  10/24/2022, 8:31 PM

## 2022-10-24 NOTE — Assessment & Plan Note (Addendum)
-   Statin held during hospitalization due to elevated LFTs

## 2022-10-24 NOTE — Assessment & Plan Note (Addendum)
-   MRCP shows distended gallbladder with small layering stones.  Trace pericholecystic fluid with no ductal dilatation nor choledocholithiasis. -LFTs elevated - General surgery consulted due to concern for acute cholecystitis

## 2022-10-24 NOTE — Assessment & Plan Note (Addendum)
-  patient has history of CKD3a. Baseline creat ~ 1.5-1.6, eGFR 48

## 2022-10-24 NOTE — Assessment & Plan Note (Addendum)
Repleted. °

## 2022-10-24 NOTE — ED Triage Notes (Addendum)
Patient here from pcp office reporting generalized abd pain increased upper/epigastric, 10/10. 50 Fent and 4mg  Zofran given in route. Abd distended.

## 2022-10-24 NOTE — Assessment & Plan Note (Addendum)
-   Small bilateral pleural effusions noted on CXR.  Also some concern in the past for patient having some possible aspiration -Remains nontoxic-appearing -Weaned to room air - Okay to discontinue antibiotics from a respiratory standpoint as well

## 2022-10-24 NOTE — ED Notes (Signed)
2hr repeat lactic sent. EDP at [facility]. [place] into room. Pt/family updated. "Feeling better", calmer.

## 2022-10-24 NOTE — ED Notes (Signed)
CT notified face to face of priority

## 2022-10-24 NOTE — ED Provider Notes (Signed)
[facility] DEPT Provider Note  CSN: [id number] Arrival date & time: 10/24/22 1500  Chief Complaint(s) Abdominal Pain  HPI Larry Olsen is a 81 y.o. male with PMH HTN who presents emerged part for evaluation of abdominal pain.  History obtained from patient's daughter who states that over the last month he has had intermittent episodes of abdominal pain triggered by different heavy meals that ultimately resolve but he had acute onset and persistently worsening epigastric and right upper quadrant pain that started this morning.  He endorses nausea but denies vomiting.  Patient arrives hypotensive to 87/77 with complaints of abdominal pain that radiates to the back and up into the shoulders.  Patient also to have a new 2 L oxygen requirement and will drop his oxygen saturations to the high 80s on room air.   Past Medical History Past Medical History:  Diagnosis Date   HTN (hypertension)    Patient Active Problem List   Diagnosis Date Noted   Pneumonia due to COVID-19 virus 09/12/2020   Hyponatremia 09/12/2020   ARF (acute renal failure) ([place]) 09/12/2020   Demand ischemia 09/12/2020   Home Medication(s) Prior to Admission medications   Medication Sig Start Date End Date Taking? Authorizing Provider  aspirin 81 MG chewable tablet Chew 1 tablet (81 mg total) by mouth daily. 10/25/20   Adrian Prows, MD  atorvastatin (LIPITOR) 80 MG tablet Take 80 mg by mouth daily. 08/19/20   [provider]  Cholecalciferol (VITAMIN D3) 50 MCG (2000 UT) CAPS Take by mouth.    [provider]  metoprolol succinate (TOPROL-XL) 25 MG 24 hr tablet Take 1 tablet (25 mg total) by mouth daily. Take with or immediately following a meal. 10/25/20 01/23/21  Adrian Prows, MD  Multiple Vitamins-Minerals (CENTRUM SILVER 50+MEN PO) Take 1 tablet by mouth daily.    [provider]                                                                                                                                     Past Surgical History Past Surgical History:  Procedure Laterality Date   PROSTATE SURGERY     Family History No family history on file.  Social History Social History   Tobacco Use   Smoking status: Never   Smokeless tobacco: Former    Types: Nurse, children's Use: Never used  Substance Use Topics   Alcohol use: Not Currently   Drug use: Never   Allergies Patient has no known allergies.  Review of Systems Review of Systems  Gastrointestinal:  Positive for abdominal pain and nausea.    Physical Exam Vital Signs  I have reviewed the triage vital signs BP (!) 87/77 (BP Location: Left Arm)   Pulse 78   Temp 97.6 F (36.4 C) (Oral)   Resp 20   SpO2 95%   Physical Exam Constitutional:  General: He is in acute distress.     Appearance: Normal appearance. He is ill-appearing.  HENT:     Head: Normocephalic and atraumatic.     Nose: No congestion or rhinorrhea.  Eyes:     General:        Right eye: No discharge.        Left eye: No discharge.     Extraocular Movements: Extraocular movements intact.     Pupils: Pupils are equal, round, and reactive to light.  Cardiovascular:     Rate and Rhythm: Normal rate and regular rhythm.     Heart sounds: No murmur heard. Pulmonary:     Effort: No respiratory distress.     Breath sounds: No wheezing or rales.  Abdominal:     General: There is no distension.     Tenderness: There is abdominal tenderness in the right upper quadrant and epigastric area.  Musculoskeletal:        General: Normal range of motion.     Cervical back: Normal range of motion.  Skin:    General: Skin is warm and dry.  Neurological:     General: No focal deficit present.     Mental Status: He is alert.     ED Results and Treatments Labs (all labs ordered are listed, but only abnormal results are displayed) Labs Reviewed  CBC WITH DIFFERENTIAL/PLATELET  COMPREHENSIVE METABOLIC PANEL   LIPASE, BLOOD  URINALYSIS, ROUTINE W REFLEX MICROSCOPIC                                                                                                                          Radiology No results found.  Pertinent labs & imaging results that were available during my care of the patient were reviewed by me and considered in my medical decision making (see MDM for details).  Medications Ordered in ED Medications  morphine (PF) 4 MG/ML injection 4 mg (has no administration in time range)  ondansetron (ZOFRAN) injection 4 mg (has no administration in time range)  lactated ringers bolus 1,000 mL (has no administration in time range)                                                                                                                                     Procedures .Critical Care  Performed by: Teressa Lower, MD Authorized by: Teressa Lower, MD   Critical care  provider statement:    Critical care time (minutes):  30   Critical care was necessary to treat or prevent imminent or life-threatening deterioration of the following conditions:  Circulatory failure and respiratory failure   Critical care was time spent personally by me on the following activities:  Development of treatment plan with patient or surrogate, discussions with consultants, evaluation of patient's response to treatment, examination of patient, ordering and review of laboratory studies, ordering and review of radiographic studies, ordering and performing treatments and interventions, pulse oximetry, re-evaluation of patient's condition and review of old charts   (including critical care time)  Medical Decision Making / ED Course   This patient presents to the ED for concern of abdominal pain, this involves an extensive number of treatment options, and is a complaint that carries with it a high risk of complications and morbidity.  The differential diagnosis includes cholecystitis, choledocholithiasis,  cholangitis, pancreatitis, peptic ulcer disease, aortic dissection, pneumonia  MDM: Patient seen emerged part for evaluation of abdominal pain and nausea.  Physical exam reveals an ill-appearing patient and extremis with significant abdominal pain in the right upper quadrant and epigastrium.  Fluid resuscitation begun immediately leading to improvement of his hypotension.  Laboratory evaluation concerning with a new leukocytosis to 81.0 with neutrophilic predominance, potassium 3.3, BUN 27, creatinine 1.39, AST elevated to 606, ALT 400, alk phos 144, total bili 1.8.  Urinalysis unremarkable.  Initial pH 7.34 with no hypercarbia.  Chest x-ray with cardiomegaly and concern for possible fluid overload.  Given symptoms involving both the chest and the abdomen with acute onset and severity, a CT dissection study was performed that shows extensive dependent bibasilar scarring and bronchiectasis of the lungs suggesting chronic aspiration but no aortic dissection or acute intra-abdominal pathology.  Right quadrant ultrasound showing a distended gallbladder with small mount of sludge but no shadowing or stones and no biliary dilatation.  I do have concern for choledocholithiasis and I spoke with the gastroenterologist on-call Dr. Michail Sermon who is recommending MRCP and medical admission.  Zosyn initiated and MRCP ordered.  Patient then admitted to the hospitalist.   Additional history obtained: -Additional history obtained from daughter -External records from outside source obtained and reviewed including: Chart review including previous notes, labs, imaging, consultation notes   Lab Tests: -I ordered, reviewed, and interpreted labs.   The pertinent results include:   Labs Reviewed  CBC WITH DIFFERENTIAL/PLATELET  COMPREHENSIVE METABOLIC PANEL  LIPASE, BLOOD  URINALYSIS, ROUTINE W REFLEX MICROSCOPIC         Imaging Studies ordered: I ordered imaging studies including chest x-ray, CT dissection study,  right quadrant ultrasound I independently visualized and interpreted imaging. I agree with the radiologist interpretation   Medicines ordered and prescription drug management: Meds ordered this encounter  Medications   morphine (PF) 4 MG/ML injection 4 mg   ondansetron (ZOFRAN) injection 4 mg   lactated ringers bolus 1,000 mL    -I have reviewed the patients home medicines and have made adjustments as needed  Critical interventions Fluid resuscitation, oxygen supplementation, antibiotics  Consultations Obtained: I requested consultation with the neurologist Dr. Michail Sermon,  and discussed lab and imaging findings as well as pertinent plan - they recommend: MRCP and possible admission   Cardiac Monitoring: The patient was maintained on a cardiac monitor.  I personally viewed and interpreted the cardiac monitored which showed an underlying rhythm of: NSR  Social Determinants of Health:  Factors impacting patients care include: none   Reevaluation: After the interventions  noted above, I reevaluated the patient and found that they have :improved  Co morbidities that complicate the patient evaluation  Past Medical History:  Diagnosis Date   HTN (hypertension)       Dispostion: I considered admission for this patient, and due to new oxygen requirement and persistent abdominal pain patient require hospital admission     Final Clinical Impression(s) / ED Diagnoses Final diagnoses:  None     _0 @    Teressa Lower, MD 10/24/22 1946

## 2022-10-24 NOTE — Assessment & Plan Note (Addendum)
Presenting with leukocytosis, tachypnea, elevated LFTs, lactic acidosis with presumed intra-abdominal infectious source. -Zosyn discontinued per surgery

## 2022-10-24 NOTE — ED Notes (Signed)
Resting, looks uncomfortable, pain fluctuates, "feel a little better, but not much". EDP at [facility] Wife at [facility].

## 2022-10-24 NOTE — Assessment & Plan Note (Addendum)
-   Resume Toprol at discharge

## 2022-10-24 NOTE — Progress Notes (Signed)
A consult was received from an ED physician for zosyn per pharmacy dosing.  The patient's profile has been reviewed for ht/wt/allergies/indication/available labs.   A one time order has been placed for zosyn 3.375g.  Further antibiotics/pharmacy consults should be ordered by admitting physician if indicated.                       Thank you, Berkley Harvey 10/24/2022  4:53 PM

## 2022-10-25 ENCOUNTER — Other Ambulatory Visit: Payer: Self-pay

## 2022-10-25 ENCOUNTER — Encounter (HOSPITAL_COMMUNITY): Payer: Self-pay | Admitting: Internal Medicine

## 2022-10-25 ENCOUNTER — Encounter (HOSPITAL_COMMUNITY)
Admission: EM | Disposition: A | Discharge status: Home / Self Care | Payer: Self-pay | Source: Home / Self Care | Attending: Internal Medicine

## 2022-10-25 ENCOUNTER — Inpatient Hospital Stay (HOSPITAL_COMMUNITY): Payer: Medicare Other | Admitting: Registered Nurse

## 2022-10-25 DIAGNOSIS — I10 Essential (primary) hypertension: Secondary | ICD-10-CM

## 2022-10-25 DIAGNOSIS — K810 Acute cholecystitis: Secondary | ICD-10-CM | POA: Diagnosis not present

## 2022-10-25 DIAGNOSIS — R6520 Severe sepsis without septic shock: Secondary | ICD-10-CM | POA: Diagnosis not present

## 2022-10-25 DIAGNOSIS — J9601 Acute respiratory failure with hypoxia: Secondary | ICD-10-CM | POA: Diagnosis not present

## 2022-10-25 DIAGNOSIS — N289 Disorder of kidney and ureter, unspecified: Secondary | ICD-10-CM

## 2022-10-25 DIAGNOSIS — K819 Cholecystitis, unspecified: Secondary | ICD-10-CM

## 2022-10-25 DIAGNOSIS — A419 Sepsis, unspecified organism: Secondary | ICD-10-CM | POA: Diagnosis not present

## 2022-10-25 DIAGNOSIS — Z87891 Personal history of nicotine dependence: Secondary | ICD-10-CM

## 2022-10-25 HISTORY — PX: CHOLECYSTECTOMY: SHX55

## 2022-10-25 LAB — CBC
HCT: 40.7 % (ref 39.0–52.0)
Hemoglobin: 13.4 g/dL (ref 13.0–17.0)
MCH: 32.1 pg (ref 26.0–34.0)
MCHC: 32.9 g/dL (ref 30.0–36.0)
MCV: 97.6 fL (ref 80.0–100.0)
Platelets: 125 10*3/uL — ABNORMAL LOW (ref 150–400)
RBC: 4.17 MIL/uL — ABNORMAL LOW (ref 4.22–5.81)
RDW: 13.3 % (ref 11.5–15.5)
WBC: 19.3 10*3/uL — ABNORMAL HIGH (ref 4.0–10.5)
nRBC: 0 % (ref 0.0–0.2)

## 2022-10-25 LAB — POCT I-STAT, CHEM 8
BUN: 22 mg/dL (ref 8–23)
Calcium, Ion: 1.14 mmol/L — ABNORMAL LOW (ref 1.15–1.40)
Chloride: 105 mmol/L (ref 98–111)
Creatinine, Ser: 1.7 mg/dL — ABNORMAL HIGH (ref 0.61–1.24)
Glucose, Bld: 109 mg/dL — ABNORMAL HIGH (ref 70–99)
HCT: 36 % — ABNORMAL LOW (ref 39.0–52.0)
Hemoglobin: 12.2 g/dL — ABNORMAL LOW (ref 13.0–17.0)
Potassium: 4.4 mmol/L (ref 3.5–5.1)
Sodium: 138 mmol/L (ref 135–145)
TCO2: 22 mmol/L (ref 22–32)

## 2022-10-25 LAB — HEPATITIS PANEL, ACUTE
HCV Ab: NONREACTIVE
Hep A IgM: NONREACTIVE
Hep B C IgM: NONREACTIVE
Hepatitis B Surface Ag: NONREACTIVE

## 2022-10-25 LAB — COMPREHENSIVE METABOLIC PANEL
ALT: 794 U/L — ABNORMAL HIGH (ref 0–44)
AST: 743 U/L — ABNORMAL HIGH (ref 15–41)
Albumin: 3.2 g/dL — ABNORMAL LOW (ref 3.5–5.0)
Alkaline Phosphatase: 189 U/L — ABNORMAL HIGH (ref 38–126)
Anion gap: 9 (ref 5–15)
BUN: 25 mg/dL — ABNORMAL HIGH (ref 8–23)
CO2: 23 mmol/L (ref 22–32)
Calcium: 8.4 mg/dL — ABNORMAL LOW (ref 8.9–10.3)
Chloride: 103 mmol/L (ref 98–111)
Creatinine, Ser: 1.63 mg/dL — ABNORMAL HIGH (ref 0.61–1.24)
GFR, Estimated: 42 mL/min — ABNORMAL LOW (ref >60)
Glucose, Bld: 120 mg/dL — ABNORMAL HIGH (ref 70–99)
Potassium: 5.3 mmol/L — ABNORMAL HIGH (ref 3.5–5.1)
Sodium: 135 mmol/L (ref 135–145)
Total Bilirubin: 4 mg/dL — ABNORMAL HIGH (ref 0.3–1.2)
Total Protein: 6.1 g/dL — ABNORMAL LOW (ref 6.5–8.1)

## 2022-10-25 LAB — PROCALCITONIN: Procalcitonin: 16.38 ng/mL

## 2022-10-25 SURGERY — LAPAROSCOPIC CHOLECYSTECTOMY WITH INTRAOPERATIVE CHOLANGIOGRAM
Anesthesia: General | Site: Abdomen

## 2022-10-25 MED ORDER — LIDOCAINE HCL (PF) 2 % IJ SOLN
INTRAMUSCULAR | Status: AC
  Filled 2022-10-25: qty 5

## 2022-10-25 MED ORDER — LACTATED RINGERS IR SOLN
Status: DC | PRN
  Administered 2022-10-25: 1000 mL

## 2022-10-25 MED ORDER — OXYCODONE HCL 5 MG PO TABS: 2.5000 mg | ORAL_TABLET | ORAL | Status: DC | PRN

## 2022-10-25 MED ORDER — ACETAMINOPHEN 325 MG PO TABS
650.0000 mg | ORAL_TABLET | Freq: Four times a day (QID) | ORAL | Status: DC | PRN
  Administered 2022-10-25: 650 mg via ORAL
  Filled 2022-10-25: qty 2

## 2022-10-25 MED ORDER — ATROPINE SULFATE 1 MG/10ML IJ SOSY
PREFILLED_SYRINGE | INTRAMUSCULAR | Status: AC
  Filled 2022-10-25: qty 10

## 2022-10-25 MED ORDER — BUPIVACAINE-EPINEPHRINE 0.5% -1:200000 IJ SOLN
INTRAMUSCULAR | Status: DC | PRN
  Administered 2022-10-25: 30 mL

## 2022-10-25 MED ORDER — 0.9 % SODIUM CHLORIDE (POUR BTL) OPTIME
TOPICAL | Status: DC | PRN
  Administered 2022-10-25: 1000 mL

## 2022-10-25 MED ORDER — PROPOFOL 10 MG/ML IV BOLUS
INTRAVENOUS | Status: DC | PRN
  Administered 2022-10-25: 100 mg via INTRAVENOUS

## 2022-10-25 MED ORDER — LACTATED RINGERS IV SOLN: INTRAVENOUS | Status: DC

## 2022-10-25 MED ORDER — ROCURONIUM BROMIDE 10 MG/ML (PF) SYRINGE
PREFILLED_SYRINGE | INTRAVENOUS | Status: DC | PRN
  Administered 2022-10-25: 70 mg via INTRAVENOUS

## 2022-10-25 MED ORDER — FENTANYL CITRATE (PF) 100 MCG/2ML IJ SOLN
INTRAMUSCULAR | Status: DC | PRN
  Administered 2022-10-25: 50 ug via INTRAVENOUS
  Administered 2022-10-25 (×2): 25 ug via INTRAVENOUS

## 2022-10-25 MED ORDER — ONDANSETRON HCL 4 MG/2ML IJ SOLN
INTRAMUSCULAR | Status: DC | PRN
  Administered 2022-10-25: 4 mg via INTRAVENOUS

## 2022-10-25 MED ORDER — ROCURONIUM BROMIDE 10 MG/ML (PF) SYRINGE
PREFILLED_SYRINGE | INTRAVENOUS | Status: AC
  Filled 2022-10-25: qty 10

## 2022-10-25 MED ORDER — ACETAMINOPHEN 500 MG PO TABS
1000.0000 mg | ORAL_TABLET | Freq: Once | ORAL | Status: AC
  Administered 2022-10-25: 1000 mg via ORAL

## 2022-10-25 MED ORDER — ONDANSETRON HCL 4 MG/2ML IJ SOLN
INTRAMUSCULAR | Status: AC
  Filled 2022-10-25: qty 2

## 2022-10-25 MED ORDER — FENTANYL CITRATE (PF) 100 MCG/2ML IJ SOLN
INTRAMUSCULAR | Status: AC
  Filled 2022-10-25: qty 2

## 2022-10-25 MED ORDER — EPHEDRINE SULFATE-NACL 50-0.9 MG/10ML-% IV SOSY
PREFILLED_SYRINGE | INTRAVENOUS | Status: DC | PRN
  Administered 2022-10-25: 5 mg via INTRAVENOUS

## 2022-10-25 MED ORDER — BUPIVACAINE-EPINEPHRINE (PF) 0.5% -1:200000 IJ SOLN
INTRAMUSCULAR | Status: AC
  Filled 2022-10-25: qty 30

## 2022-10-25 MED ORDER — DEXAMETHASONE SODIUM PHOSPHATE 10 MG/ML IJ SOLN
INTRAMUSCULAR | Status: DC | PRN
  Administered 2022-10-25: 8 mg via INTRAVENOUS

## 2022-10-25 MED ORDER — SUGAMMADEX SODIUM 200 MG/2ML IV SOLN
INTRAVENOUS | Status: DC | PRN
  Administered 2022-10-25: 200 mg via INTRAVENOUS

## 2022-10-25 MED ORDER — FENTANYL CITRATE PF 50 MCG/ML IJ SOSY: 25.0000 ug | PREFILLED_SYRINGE | INTRAMUSCULAR | Status: DC | PRN

## 2022-10-25 MED ORDER — DEXAMETHASONE SODIUM PHOSPHATE 10 MG/ML IJ SOLN
INTRAMUSCULAR | Status: AC
  Filled 2022-10-25: qty 1

## 2022-10-25 MED ORDER — DOCUSATE SODIUM 100 MG PO CAPS
100.0000 mg | ORAL_CAPSULE | Freq: Two times a day (BID) | ORAL | Status: DC
  Administered 2022-10-26 (×2): 100 mg via ORAL
  Filled 2022-10-25 (×4): qty 1

## 2022-10-25 MED ORDER — LIDOCAINE 2% (20 MG/ML) 5 ML SYRINGE
INTRAMUSCULAR | Status: DC | PRN
  Administered 2022-10-25: 80 mg via INTRAVENOUS

## 2022-10-25 MED ORDER — PHENYLEPHRINE 80 MCG/ML (10ML) SYRINGE FOR IV PUSH (FOR BLOOD PRESSURE SUPPORT)
PREFILLED_SYRINGE | INTRAVENOUS | Status: DC | PRN
  Administered 2022-10-25 (×3): 160 ug via INTRAVENOUS

## 2022-10-25 MED ORDER — HEMOSTATIC AGENTS (NO CHARGE) OPTIME
TOPICAL | Status: DC | PRN
  Administered 2022-10-25: 1 via TOPICAL

## 2022-10-25 SURGICAL SUPPLY — 45 items
ADH SKN CLS APL DERMABOND .7 (GAUZE/BANDAGES/DRESSINGS) ×1
APL PRP STRL LF DISP 70% ISPRP (MISCELLANEOUS) ×1
APPLIER CLIP ROT 10 11.4 M/L (STAPLE) ×1
APR CLP MED LRG 11.4X10 (STAPLE) ×1
BAG COUNTER SPONGE SURGICOUNT (BAG) IMPLANT
BAG SPEC RTRVL 10 TROC 200 (ENDOMECHANICALS) ×1
BAG SPNG CNTER NS LX DISP (BAG)
CABLE HIGH FREQUENCY MONO STRZ (ELECTRODE) ×2 IMPLANT
CATH URETL OPEN 5X70 (CATHETERS) IMPLANT
CHLORAPREP W/TINT 26 (MISCELLANEOUS) ×2 IMPLANT
CLIP APPLIE ROT 10 11.4 M/L (STAPLE) ×2 IMPLANT
COVER MAYO STAND XLG (MISCELLANEOUS) ×2 IMPLANT
COVER SURGICAL LIGHT HANDLE (MISCELLANEOUS) ×2 IMPLANT
DERMABOND ADVANCED .7 DNX12 (GAUZE/BANDAGES/DRESSINGS) ×2 IMPLANT
DRAPE C-ARM 42X120 X-RAY (DRAPES) IMPLANT
ELECT REM PT RETURN 15FT ADLT (MISCELLANEOUS) ×2 IMPLANT
ENDOLOOP SUT PDS II  0 18 (SUTURE) ×1
ENDOLOOP SUT PDS II 0 18 (SUTURE) IMPLANT
GLOVE BIO SURGEON STRL SZ7.5 (GLOVE) ×2 IMPLANT
GLOVE BIOGEL PI IND STRL 8 (GLOVE) ×2 IMPLANT
GOWN STRL REUS W/ TWL XL LVL3 (GOWN DISPOSABLE) ×4 IMPLANT
GOWN STRL REUS W/TWL XL LVL3 (GOWN DISPOSABLE) ×2
GRASPER SUT TROCAR 14GX15 (MISCELLANEOUS) IMPLANT
HEMOSTAT SNOW SURGICEL 2X4 (HEMOSTASIS) IMPLANT
IRRIG SUCT STRYKERFLOW 2 WTIP (MISCELLANEOUS) ×1
IRRIGATION SUCT STRKRFLW 2 WTP (MISCELLANEOUS) ×2 IMPLANT
IV CATH 14GX2 1/4 (CATHETERS) ×2 IMPLANT
KIT BASIN OR (CUSTOM PROCEDURE TRAY) ×2 IMPLANT
KIT TURNOVER KIT A (KITS) IMPLANT
NDL INSUFFLATION 14GA 120MM (NEEDLE) ×2 IMPLANT
NEEDLE INSUFFLATION 14GA 120MM (NEEDLE) ×1 IMPLANT
PENCIL SMOKE EVACUATOR (MISCELLANEOUS) IMPLANT
POUCH RETRIEVAL ECOSAC 10 (ENDOMECHANICALS) ×2 IMPLANT
POUCH RETRIEVAL ECOSAC 10MM (ENDOMECHANICALS) ×1
SCISSORS LAP 5X35 DISP (ENDOMECHANICALS) ×2 IMPLANT
SET TUBE SMOKE EVAC HIGH FLOW (TUBING) ×2 IMPLANT
SLEEVE Z-THREAD 5X100MM (TROCAR) ×4 IMPLANT
SPIKE FLUID TRANSFER (MISCELLANEOUS) ×2 IMPLANT
STOPCOCK 4 WAY LG BORE MALE ST (IV SETS) IMPLANT
SUT MNCRL AB 4-0 PS2 18 (SUTURE) ×2 IMPLANT
TOWEL OR 17X26 10 PK STRL BLUE (TOWEL DISPOSABLE) ×2 IMPLANT
TOWEL OR NON WOVEN STRL DISP B (DISPOSABLE) IMPLANT
TRAY LAPAROSCOPIC (CUSTOM PROCEDURE TRAY) ×2 IMPLANT
TROCAR ADV FIXATION 12X100MM (TROCAR) ×2 IMPLANT
TROCAR Z-THREAD OPTICAL 5X100M (TROCAR) ×2 IMPLANT

## 2022-10-25 NOTE — Anesthesia Procedure Notes (Signed)
Procedure Name: Intubation Date/Time: 10/25/2022 12:27 PM  Performed by: Victoriano Lain, CRNAPre-anesthesia Checklist: Patient identified, Emergency Drugs available, Suction available, Patient being monitored and Timeout performed Patient Re-evaluated:Patient Re-evaluated prior to induction Oxygen Delivery Method: Circle system utilized Preoxygenation: Pre-oxygenation with 100% oxygen Induction Type: IV induction Ventilation: Mask ventilation without difficulty Laryngoscope Size: Mac and 4 Grade View: Grade I Tube type: Oral Tube size: 7.5 mm Number of attempts: 1 Airway Equipment and Method: Stylet Placement Confirmation: ETT inserted through vocal cords under direct vision, positive ETCO2 and breath sounds checked- equal and bilateral Secured at: 22 cm Tube secured with: Tape Dental Injury: Teeth and Oropharynx as per pre-operative assessment

## 2022-10-25 NOTE — Consult Note (Signed)
Referring Provider: Dr. Frederick Peers Primary Care Physician:  Daisy Floro, MD Primary Gastroenterologist:  Gentry Fitz  Reason for Consultation:  Abdominal pain; Elevated LFTs  HPI: Larry Olsen is a 81 y.o. male with history of prostate cancer presents with the acute onset of severe sharp epigastric abdominal pain Wednesday night with nausea without any vomiting. For the past month he has been having intermittent post-prandial abdominal pain. TB 4, ALP 189, AST 743, ALT 794, Lipase 32. MRCP shows acute cholecystitis without any biliary dilation or choledocholithiasis. Daughter and son-in-law at bedside.  Past Medical History:  Diagnosis Date   HTN (hypertension)     Past Surgical History:  Procedure Laterality Date   PROSTATE SURGERY      Prior to Admission medications   Medication Sig Start Date End Date Taking? Authorizing Provider  aspirin 81 MG chewable tablet Chew 1 tablet (81 mg total) by mouth daily. Patient taking differently: Chew 81 mg by mouth daily with lunch. 10/25/20  Yes Yates Decamp, MD  atorvastatin (LIPITOR) 80 MG tablet Take 80 mg by mouth daily with lunch. 08/19/20  Yes [provider]  Cholecalciferol (VITAMIN D3) 50 MCG (2000 UT) CAPS Take 2,000 Units by mouth daily with lunch.   Yes [provider]  metoprolol succinate (TOPROL-XL) 25 MG 24 hr tablet Take 1 tablet (25 mg total) by mouth daily. Take with or immediately following a meal. Patient taking differently: Take 25 mg by mouth in the morning. Take with or immediately following a meal. 10/25/20 10/24/22 Yes Yates Decamp, MD  Multiple Vitamins-Minerals (CENTRUM SILVER 50+MEN PO) Take 1 tablet by mouth daily with breakfast.   Yes [provider]    Scheduled Meds:  heparin  5,000 Units Subcutaneous Q8H   sodium chloride flush  3 mL Intravenous Q12H   Continuous Infusions:  piperacillin-tazobactam (ZOSYN)  IV 3.375 g (10/25/22 1035)   PRN Meds:.HYDROmorphone (DILAUDID) injection,  ondansetron (ZOFRAN) IV  Allergies as of 10/24/2022   (No Known Allergies)    No family history on file.  Social History   Socioeconomic History   Marital status: Widowed    Spouse name: Not on file   Number of children: 3   Years of education: Not on file   Highest education level: Not on file  Occupational History   Not on file  Tobacco Use   Smoking status: Never   Smokeless tobacco: Former    Types: Associate Professor Use: Never used  Substance and Sexual Activity   Alcohol use: Not Currently   Drug use: Never   Sexual activity: Not on file  Other Topics Concern   Not on file  Social History Narrative   2 children deceased/1 living Daughter POA : Marvene Staff   Social Determinants of Health   Financial Resource Strain: Not on file  Food Insecurity: Not on file  Transportation Needs: Not on file  Physical Activity: Not on file  Stress: Not on file  Social Connections: Not on file  Intimate Partner Violence: Not on file    Review of Systems: All negative except as stated above in HPI.  Physical Exam: Vital signs: Vitals:   10/25/22 0800 10/25/22 1005  BP: 111/62   Pulse: 85   Resp: 18   Temp:  98 F (36.7 C)  SpO2: 91%    Last BM Date : 10/25/22 General:   Lethargic, elderly, Well-developed, well-nourished, pleasant and cooperative in NAD Head: normocephalic, atraumatic Eyes: anicteric sclera ENT: oropharynx clear  Neck: supple, nontender Lungs:  Clear throughout to auscultation.   No wheezes, crackles, or rhonchi. No acute distress. Heart:  Regular rate and rhythm; no murmurs, clicks, rubs,  or gallops. Abdomen: epigastric tenderness with guarding, soft, nondistended, +BS  Rectal:  Deferred Ext: no edema  GI:  Lab Results: Recent Labs    10/24/22 1538 10/24/22 1549 10/25/22 0540  WBC 19.1*  --  19.3*  HGB 14.7 13.6 13.4  HCT 44.0 40.0 40.7  PLT 176  --  125*   BMET Recent Labs    10/24/22 1538 10/24/22 1549 10/25/22 0540   NA 139 138 135  K 3.3* 4.2 5.3*  CL 112* 105 103  CO2 18*  --  23  GLUCOSE 193* 217* 120*  BUN 27* 28* 25*  CREATININE 1.32* 1.40* 1.63*  CALCIUM 7.4*  --  8.4*   LFT Recent Labs    10/25/22 0540  PROT 6.1*  ALBUMIN 3.2*  AST 743*  ALT 794*  ALKPHOS 189*  BILITOT 4.0*   PT/INR No results for input(s): "LABPROT", "INR" in the last 72 hours.   Studies/Results: MR ABDOMEN MRCP W WO CONTAST  Result Date: 10/25/2022 CLINICAL DATA:  Right upper quadrant abdominal pain. Biliary disease suspected. EXAM: MRI ABDOMEN WITHOUT AND WITH CONTRAST (INCLUDING MRCP) TECHNIQUE: Multiplanar multisequence MR imaging of the abdomen was performed both before and after the administration of intravenous contrast. Heavily T2-weighted images of the biliary and pancreatic ducts were obtained, and three-dimensional MRCP images were rendered by post processing. CONTRAST:  6mL GADAVIST GADOBUTROL 1 MMOL/ML IV SOLN COMPARISON:  CTA chest abdomen and pelvis earlier same day FINDINGS: Lower chest: The bibasilar scarring and bronchiectasis described on CT scan earlier today is not well demonstrated by MRI. Hepatobiliary: No suspicious focal abnormality within the liver parenchyma. Gallbladder is distended with tiny layering gallstones evident (see axial T2 haste image 18 of series 4). Trace pericholecystic fluid evident. No intra or extrahepatic biliary duct dilatation MRCP imaging is degraded by motion artifact but within this limitation, no choledocholithiasis evident. Pancreas: No focal mass lesion. No dilatation of the main duct. No intraparenchymal cyst. No peripancreatic edema. Spleen:  No splenomegaly. No focal mass lesion. Adrenals/Urinary Tract: No adrenal nodule or mass. Kidneys unremarkable. Stomach/Bowel: Stomach is nondistended. There is trace fluid around the descending duodenum No small bowel or colonic dilatation within the visualized abdomen. Vascular/Lymphatic: No abdominal aortic aneurysm. No abdominal  lymphadenopathy. Other:  No substantial intraperitoneal free fluid. Musculoskeletal: No focal suspicious marrow enhancement within the visualized bony anatomy. IMPRESSION: 1. Distended gallbladder with tiny layering gallstones. Trace pericholecystic fluid evident. No intra or extrahepatic biliary duct dilatation. No choledocholithiasis evident. Acute cholecystitis would be a distinct consideration. If clinical picture is equivocal, nuclear scintigraphy may prove helpful to assess for cystic duct obstruction. 2. Trace fluid around the descending duodenum, likely related to gallbladder findings. 3. Bibasilar scarring and bronchiectasis described on CT scan earlier today is not well demonstrated by MRI. Electronically Signed   By: Kennith CenterEric  Mansell M.D.   On: 10/25/2022 05:23   US Abdomen Limited RUQ (LIVER/GB)  Result Date: 10/24/2022 CLINICAL DATA:  Distended gallbladder on CT EXAM: ULTRASOUND ABDOMEN LIMITED RIGHT UPPER QUADRANT COMPARISON:  None Available. FINDINGS: Gallbladder: Small amount of gallbladder sludge. No shadowing stones. Normal wall thickness. Negative sonographic Murphy. Gallbladder is distended. Common bile duct: Diameter: 2.6 mm Liver: No focal lesion identified. Within normal limits in parenchymal echogenicity. Portal vein is patent on color Doppler imaging with normal direction of blood flow  towards the liver. Other: None. IMPRESSION: Distended gallbladder with small amount of sludge. No shadowing stones. Negative for biliary dilatation. Electronically Signed   By: Jasmine Pang M.D.   On: 10/24/2022 17:52   CT Angio Chest/Abd/Pel for Dissection W and/or Wo Contrast  Result Date: 10/24/2022 CLINICAL DATA:  Acute aortic syndrome suspected EXAM: CT ANGIOGRAPHY CHEST, ABDOMEN AND PELVIS TECHNIQUE: Non-contrast CT of the chest was initially obtained. Multidetector CT imaging through the chest, abdomen and pelvis was performed using the standard protocol during bolus administration of intravenous  contrast. Multiplanar reconstructed images and MIPs were obtained and reviewed to evaluate the vascular anatomy. RADIATION DOSE REDUCTION: This exam was performed according to the departmental dose-optimization program which includes automated exposure control, adjustment of the mA and/or kV according to patient size and/or use of iterative reconstruction technique. CONTRAST:  OMNIPAQUE IOHEXOL 350 MG/ML SOLN COMPARISON:  CT chest angiogram, 09/12/2020 FINDINGS: CTA CHEST FINDINGS VASCULAR Aorta: Satisfactory opacification of the aorta. Normal contour and caliber of the thoracic aorta. No evidence of aneurysm, dissection, or other acute aortic pathology. Moderate mixed calcific atherosclerosis. Cardiovascular: No evidence of pulmonary embolism on limited non-tailored examination. Normal heart size. Three-vessel coronary artery calcifications. No pericardial effusion. Review of the MIP images confirms the above findings. NON VASCULAR Mediastinum/Nodes: No enlarged mediastinal, hilar, or axillary lymph nodes. Thyroid gland, trachea, and esophagus demonstrate no significant findings. Lungs/Pleura: Extensive dependent bibasilar scarring and bronchiectasis (series 4, image 92). No pleural effusion or pneumothorax. Musculoskeletal: No chest wall abnormality. No acute osseous findings. Review of the MIP images confirms the above findings. CTA ABDOMEN AND PELVIS FINDINGS VASCULAR Normal contour and caliber of the abdominal aorta. No evidence of aneurysm, dissection, or other acute aortic pathology. Standard branching pattern of the abdominal aorta with solitary bilateral renal arteries. Review of the MIP images confirms the above findings. NON-VASCULAR Hepatobiliary: No solid liver abnormality is seen. Mildly distended gallbladder. No gallstones, gallbladder wall thickening, or biliary dilatation. Pancreas: Unremarkable. No pancreatic ductal dilatation or surrounding inflammatory changes. Spleen: Normal in size  without significant abnormality. Adrenals/Urinary Tract: Adrenal glands are unremarkable. Kidneys are normal, without renal calculi, solid lesion, or hydronephrosis. Bladder is unremarkable. Stomach/Bowel: Stomach is within normal limits. Appendix is not clearly visualized. No evidence of bowel wall thickening, distention, or inflammatory changes. Sigmoid diverticulosis. Lymphatic: No enlarged abdominal or pelvic lymph nodes. Reproductive: Status post prostatectomy. Other: No abdominal wall hernia or abnormality. No ascites. Musculoskeletal: No acute osseous findings. IMPRESSION: 1. Normal contour and caliber of the thoracic and abdominal aorta. No evidence of aneurysm, dissection, or other acute aortic pathology. Moderate mixed calcific atherosclerosis. 2. 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 3. Coronary artery disease. 4. Sigmoid diverticulosis without evidence of acute diverticulitis. 5. Status post prostatectomy. Aortic Atherosclerosis (ICD10-I70.0). Electronically Signed   By: Jearld Lesch M.D.   On: 10/24/2022 17:18   DG Chest Portable 1 View  Result Date: 10/24/2022 CLINICAL DATA:  Abdominal distension.  Evaluate for free air. EXAM: PORTABLE CHEST 1 VIEW COMPARISON:  Chest radiographs 12/10/2019 and 09/15/2020. Chest CT 09/12/2020. FINDINGS: 1457 hours. AP semi erect view demonstrates cardiomegaly, probable pulmonary edema and small bilateral pleural effusions. There is no confluent airspace opacity or pneumothorax. No evidence of pneumoperitoneum on this view. Aortic atherosclerosis noted. There are degenerative changes within the spine without evidence of acute osseous abnormality. Numerous telemetry leads overlie the chest and upper abdomen. IMPRESSION: 1. No evidence of pneumoperitoneum. 2. Cardiomegaly with  probable pulmonary edema and small bilateral pleural effusions suspicious for recurrent congestive  heart failure. Electronically Signed   By: Carey Bullocks M.D.   On: 10/24/2022 15:38    Impression/Plan: Acute cholecystitis with elevated LFTs without evidence of choledocholithiasis or dilated ducts on MRCP. Agree with GB surgery with IOC and if stones present will need post-op ERCP. [facility] (Dr. Lorenso Quarry) will f/u this weekend.    LOS: 1 day   Shirley Friar  10/25/2022, 11:12 AM  Questions please call [phone]

## 2022-10-25 NOTE — Op Note (Signed)
Patient: Larry Olsen ([birth year], [id number])  Date of Surgery: 10/25/22  Preoperative Diagnosis: Cholecystitis   Postoperative Diagnosis: Cholecystitis   Surgical Procedure: LAPAROSCOPIC CHOLECYSTECTOMY  Operative Team Members:  Surgeon(s) and Role:    * Mihran Lebarron, Hyman Hopes, MD - Primary   Anesthesiologist: Kaylyn Layer, MD; Elmer Picker, MD CRNA: Elisabeth Cara, CRNA   Anesthesia: General   Fluids:  Total I/O In: 1000 [I.V.:1000] Out: 50 [Blood:50]  Complications: None  Drains:  none   Specimen:  ID Type Source Tests Collected by Time Destination  1 : Gallbladder Tissue PATH Gallbladder SURGICAL PATHOLOGY Judyth Demarais, Hyman Hopes, MD 10/25/2022 1419      Disposition:  PACU - hemodynamically stable.  Plan of Care:  Continue inpatient care    Indications for Procedure: Larry Olsen is a 81 y.o. male who presented with abdominal pain.  History, physical and imaging was concerning for cholecystitis.  Laparoscopic cholecystectomy was recommended for the patient.  The procedure itself, as well as the risks, benefits and alternatives were discussed with the patient.  Risks discussed included but were not limited to the risk of infection, bleeding, damage to nearby structures, need to convert to open procedure, incisional hernia, bile leak, common bile duct injury and the need for additional procedures or surgeries.  With this discussion complete and all questions answered the patient granted consent to proceed.  Findings: Inflamed gallbladder with gallstones.   Infection status: Patient: Larry Olsen [facility] Service Patient Case: Urgent Infection Present At Time Of Surgery (PATOS):  Spillage of bile, infected gallbladder   Description of Procedure:   On the date stated above, the patient was taken to the operating room suite and placed in supine positioning.  Sequential compression devices were placed on the lower extremities to prevent  blood clots.  General endotracheal anesthesia was induced. Preoperative antibiotics were given.  The patient's abdomen was prepped and draped in the usual sterile fashion.  A time-out was completed verifying the correct patient, procedure, positioning and equipment needed for the case.  We began by anesthetizing the skin with local anesthetic and then making a 5 mm incision just below the umbilicus.  We dissected through the subcutaneous tissues to the fascia.  The fascia was grasped and elevated using a Kocher clamp.  A Veress needle was inserted into the abdomen and the abdomen was insufflated to 15 mmHg.  A 5 mm trocar was inserted in this position under optical guidance and then the abdomen was inspected.  There was no trauma to the underlying viscera with initial trocar placement.  Any abnormal findings, other than inflammation in the right upper quadrant, are listed above in the findings section.  Three additional trocars were placed, one 12 mm trocar in the subxiphoid position, one 5 mm trocar in the midline epigastric area and one 20mm trocar in the right upper quadrant subcostally.  These were placed under direct vision without any trauma to the underlying viscera.    The patient was then placed in head up, left side down positioning.  The gallbladder was identified and dissected free from its attachments to the omentum allowing the duodenum to fall away.  The infundibulum of the gallbladder was dissected free working laterally to medially.  The cystic duct and cystic artery were dissected free from surrounding connective tissue.  The infundibulum of the gallbladder was dissected off the cystic plate.  A critical view of safety was obtained with the cystic duct and cystic artery being cleared  of connective tissues and clearly the only two structures entering into the gallbladder with the liver clearly visible behind.  Clips were then applied to the cystic duct and cystic artery and then these structures  were divided.  A PDS endoloop was applied to the cystic duct stump.  The gallbladder was dissected off the cystic plate, placed in an endocatch bag and removed from the 12 mm subxiphoid port site.  There was spillage of bile.  There was bleeding from the gallbladder fossa controlled with clips, electrocautery, pressure with a raytec and finally snow.   A suction irrigator was used to clean the operative field.  Attention was turned to closure.  The 12 mm subxiphoid port site was closed using a 0-vicryl suture on a fascial suture passer.  The abdomen was desufflated.  The skin was closed using 4-0 monocryl and dermabond.  All sponge and needle counts were correct at the conclusion of the case.    Ivar Drape, MD General, Bariatric, & Minimally Invasive Surgery [facility], [place]

## 2022-10-25 NOTE — Progress Notes (Signed)
Progress Note    Larry Olsen   ZOX:[id number]  DOB: October 03, 1941  DOA: 10/24/2022     1 PCP: Lawerance Cruel, MD  Initial CC: abd pain  Hospital Course: Larry Olsen is a 81 y.o. male with medical history significant for HTN, HLD, CKD stage IIIa, prostate cancer s/p prostatectomy who is admitted with sepsis and elevated LFTs.  He underwent multiple imaging studies on admission.  Ultimately there was concern for acute cholecystitis with a distended gallbladder noted and trace pericholecystic fluid found on MRCP.  No choledocholithiasis was present. General surgery was consulted and patient was recommended for cholecystectomy.  Interval History:  Admitted overnight with abdominal pain.  Workup consistent with acute cholecystitis; surgery planning for cholecystectomy today. Family present bedside and understands plan.  Assessment and Plan: * Severe sepsis ([place]) Presenting with leukocytosis, tachypnea, elevated LFTs, lactic acidosis with presumed intra-abdominal infectious source. -Continue empiric IV Zosyn -Follow blood cultures -Follow-up cholecystectomy results  Acute respiratory failure with hypoxia ([facility]) - Small bilateral pleural effusions noted on CXR.  Also some concern in the past for patient having some possible aspiration - Continue Zosyn for now - Wean O2 as able - Possible repeat CXR postop - will evaluate fluid status and decide if any lasix still needed  Acute cholecystitis - MRCP shows distended gallbladder with small layering stones.  Trace pericholecystic fluid with no ductal dilatation nor choledocholithiasis. -LFTs elevated - General surgery consulted due to concern for acute cholecystitis  Chronic kidney disease, stage 3a ([place]) - patient has history of CKD3a. Baseline creat ~ 1.5-1.6, eGFR 48   Hypocalcemia - Replete as needed  Hypokalemia - Replete as needed  Hyperlipidemia Holding statin due to elevated LFTs.  Essential hypertension Hold  Toprol-XL for now as he has had some low blood pressures.   Old records reviewed in assessment of this patient  Antimicrobials: Zosyn 10/24/2022 >> current  DVT prophylaxis:  heparin injection 5,000 Units Start: 10/24/22 2200   Code Status:   Code Status: Full Code  Mobility Assessment (last 72 hours)     Mobility Assessment     Row Name 10/25/22 02:15:40           Does patient have an order for bedrest or is patient medically unstable No - Continue assessment       What is the highest level of mobility based on the progressive mobility assessment? Level 6 (Walks independently in room and hall) - Balance while walking in room without assist - Complete                Barriers to discharge:  Disposition Plan:  Home 2-3 days Status is: INpt  Objective: Blood pressure 122/63, pulse 84, temperature 98 F (36.7 C), temperature source Oral, resp. rate (!) 21, SpO2 98 %.  Examination:  Physical Exam Constitutional:      General: He is not in acute distress.    Appearance: Normal appearance.  HENT:     Head: Normocephalic and atraumatic.     Mouth/Throat:     Mouth: Mucous membranes are moist.  Eyes:     Extraocular Movements: Extraocular movements intact.  Cardiovascular:     Rate and Rhythm: Normal rate and regular rhythm.     Heart sounds: Normal heart sounds.  Pulmonary:     Effort: Pulmonary effort is normal. No respiratory distress.     Breath sounds: Rhonchi present. No wheezing.  Abdominal:     General: Bowel sounds are normal. There is no  distension.     Palpations: Abdomen is soft.  Musculoskeletal:        General: Normal range of motion.     Cervical back: Normal range of motion and neck supple.  Skin:    General: Skin is warm and dry.  Neurological:     General: No focal deficit present.     Mental Status: He is alert.  Psychiatric:        Mood and Affect: Mood normal.        Behavior: Behavior normal.      Consultants:  GI General  surgery  Procedures:  10/25/22, CCY  Data Reviewed: Results for orders placed or performed during the hospital encounter of 10/24/22 (from the past 24 hour(s))  CBC with Differential     Status: Abnormal   Collection Time: 10/24/22  3:38 PM  Result Value Ref Range   WBC 19.1 (H) 4.0 - 10.5 K/uL   RBC 4.54 4.22 - 5.81 MIL/uL   Hemoglobin 14.7 13.0 - 17.0 g/dL   HCT 44.0 39.0 - 52.0 %   MCV 96.9 80.0 - 100.0 fL   MCH 32.4 26.0 - 34.0 pg   MCHC 33.4 30.0 - 36.0 g/dL   RDW 12.6 11.5 - 15.5 %   Platelets 176 150 - 400 K/uL   nRBC 0.0 0.0 - 0.2 %   Neutrophils Relative % 91 %   Neutro Abs 17.4 (H) 1.7 - 7.7 K/uL   Lymphocytes Relative 3 %   Lymphs Abs 0.5 (L) 0.7 - 4.0 K/uL   Monocytes Relative 6 %   Monocytes Absolute 1.1 (H) 0.1 - 1.0 K/uL   Eosinophils Relative 0 %   Eosinophils Absolute 0.0 0.0 - 0.5 K/uL   Basophils Relative 0 %   Basophils Absolute 0.0 0.0 - 0.1 K/uL   Immature Granulocytes 0 %   Abs Immature Granulocytes 0.08 (H) 0.00 - 0.07 K/uL  Comprehensive metabolic panel     Status: Abnormal   Collection Time: 10/24/22  3:38 PM  Result Value Ref Range   Sodium 139 135 - 145 mmol/L   Potassium 3.3 (L) 3.5 - 5.1 mmol/L   Chloride 112 (H) 98 - 111 mmol/L   CO2 18 (L) 22 - 32 mmol/L   Glucose, Bld 193 (H) 70 - 99 mg/dL   BUN 27 (H) 8 - 23 mg/dL   Creatinine, Ser 1.32 (H) 0.61 - 1.24 mg/dL   Calcium 7.4 (L) 8.9 - 10.3 mg/dL   Total Protein 5.7 (L) 6.5 - 8.1 g/dL   Albumin 3.4 (L) 3.5 - 5.0 g/dL   AST 606 (H) 15 - 41 U/L   ALT 400 (H) 0 - 44 U/L   Alkaline Phosphatase 144 (H) 38 - 126 U/L   Total Bilirubin 1.8 (H) 0.3 - 1.2 mg/dL   GFR, Estimated 54 (L) >60 mL/min   Anion gap 9 5 - 15  Lipase, blood     Status: None   Collection Time: 10/24/22  3:38 PM  Result Value Ref Range   Lipase 32 11 - 51 U/L  Blood gas, venous (at [facility] and [facility], not at [facility])     Status: Abnormal   Collection Time: 10/24/22  3:38 PM  Result Value Ref Range   pH, Ven 7.34 7.25 - 7.43    pCO2, Ven 43 (L) 44 - 60 mmHg   pO2, Ven 33 32 - 45 mmHg   Bicarbonate 23.2 20.0 - 28.0 mmol/L   Acid-base deficit 2.6 (H) 0.0 -  2.0 mmol/L   O2 Saturation 48.2 %   Patient temperature 37.0   Lactic acid, plasma     Status: Abnormal   Collection Time: 10/24/22  3:38 PM  Result Value Ref Range   Lactic Acid, Venous 3.2 (HH) 0.5 - 1.9 mmol/L  Hepatitis panel, acute     Status: None   Collection Time: 10/24/22  3:38 PM  Result Value Ref Range   Hepatitis B Surface Ag NON REACTIVE NON REACTIVE   HCV Ab NON REACTIVE NON REACTIVE   Hep A IgM NON REACTIVE NON REACTIVE   Hep B C IgM NON REACTIVE NON REACTIVE  I-stat chem 8, ED (not at [facility] or [facility])     Status: Abnormal   Collection Time: 10/24/22  3:49 PM  Result Value Ref Range   Sodium 138 135 - 145 mmol/L   Potassium 4.2 3.5 - 5.1 mmol/L   Chloride 105 98 - 111 mmol/L   BUN 28 (H) 8 - 23 mg/dL   Creatinine, Ser 1.40 (H) 0.61 - 1.24 mg/dL   Glucose, Bld 217 (H) 70 - 99 mg/dL   Calcium, Ion 1.11 (L) 1.15 - 1.40 mmol/L   TCO2 20 (L) 22 - 32 mmol/L   Hemoglobin 13.6 13.0 - 17.0 g/dL   HCT 40.0 39.0 - 52.0 %  Urinalysis, Routine w reflex microscopic     Status: Abnormal   Collection Time: 10/24/22  4:46 PM  Result Value Ref Range   Color, Urine YELLOW YELLOW   APPearance CLEAR CLEAR   Specific Gravity, Urine 1.010 1.005 - 1.030   pH 6.0 5.0 - 8.0   Glucose, UA >=500 (A) NEGATIVE mg/dL   Hgb urine dipstick NEGATIVE NEGATIVE   Bilirubin Urine NEGATIVE NEGATIVE   Ketones, ur NEGATIVE NEGATIVE mg/dL   Protein, ur NEGATIVE NEGATIVE mg/dL   Nitrite NEGATIVE NEGATIVE   Leukocytes,Ua NEGATIVE NEGATIVE   RBC / HPF 0-5 0 - 5 RBC/hpf   WBC, UA 0-5 0 - 5 WBC/hpf   Bacteria, UA RARE (A) NONE SEEN   Mucus PRESENT    Hyaline Casts, UA PRESENT   Lactic acid, plasma     Status: Abnormal   Collection Time: 10/24/22  5:27 PM  Result Value Ref Range   Lactic Acid, Venous 2.7 (HH) 0.5 - 1.9 mmol/L  Procalcitonin     Status: None   Collection  Time: 10/25/22  5:40 AM  Result Value Ref Range   Procalcitonin 16.38 ng/mL  Comprehensive metabolic panel     Status: Abnormal   Collection Time: 10/25/22  5:40 AM  Result Value Ref Range   Sodium 135 135 - 145 mmol/L   Potassium 5.3 (H) 3.5 - 5.1 mmol/L   Chloride 103 98 - 111 mmol/L   CO2 23 22 - 32 mmol/L   Glucose, Bld 120 (H) 70 - 99 mg/dL   BUN 25 (H) 8 - 23 mg/dL   Creatinine, Ser 1.63 (H) 0.61 - 1.24 mg/dL   Calcium 8.4 (L) 8.9 - 10.3 mg/dL   Total Protein 6.1 (L) 6.5 - 8.1 g/dL   Albumin 3.2 (L) 3.5 - 5.0 g/dL   AST 743 (H) 15 - 41 U/L   ALT 794 (H) 0 - 44 U/L   Alkaline Phosphatase 189 (H) 38 - 126 U/L   Total Bilirubin 4.0 (H) 0.3 - 1.2 mg/dL   GFR, Estimated 42 (L) >60 mL/min   Anion gap 9 5 - 15  CBC     Status: Abnormal   Collection  Time: 10/25/22  5:40 AM  Result Value Ref Range   WBC 19.3 (H) 4.0 - 10.5 K/uL   RBC 4.17 (L) 4.22 - 5.81 MIL/uL   Hemoglobin 13.4 13.0 - 17.0 g/dL   HCT 40.7 39.0 - 52.0 %   MCV 97.6 80.0 - 100.0 fL   MCH 32.1 26.0 - 34.0 pg   MCHC 32.9 30.0 - 36.0 g/dL   RDW 13.3 11.5 - 15.5 %   Platelets 125 (L) 150 - 400 K/uL   nRBC 0.0 0.0 - 0.2 %  I-STAT, chem 8     Status: Abnormal   Collection Time: 10/25/22  1:10 PM  Result Value Ref Range   Sodium 138 135 - 145 mmol/L   Potassium 4.4 3.5 - 5.1 mmol/L   Chloride 105 98 - 111 mmol/L   BUN 22 8 - 23 mg/dL   Creatinine, Ser 1.70 (H) 0.61 - 1.24 mg/dL   Glucose, Bld 109 (H) 70 - 99 mg/dL   Calcium, Ion 1.14 (L) 1.15 - 1.40 mmol/L   TCO2 22 22 - 32 mmol/L   Hemoglobin 12.2 (L) 13.0 - 17.0 g/dL   HCT 36.0 (L) 39.0 - 52.0 %    I have Reviewed nursing notes, Vitals, and Lab results since pt's last encounter. Pertinent lab results : see above I have ordered test including BMP, CBC, Mg I have reviewed the last note from staff over past 24 hours I have discussed pt's care plan and test results with nursing staff, case manager  Time spent: Greater than 50% of the 55 minute visit was spent  in counseling/coordination of care for the patient as laid out in the A&P.    LOS: 1 day   Dwyane Dee, MD [facility] 10/25/2022, 3:09 PM

## 2022-10-25 NOTE — Transfer of Care (Signed)
Immediate Anesthesia Transfer of Care Note  Patient: Larry Olsen  Procedure(s) Performed: LAPAROSCOPIC CHOLECYSTECTOMy (Abdomen)  Patient Location: PACU  Anesthesia Type:General  Level of Consciousness: awake, alert , oriented, and patient cooperative  Airway & Oxygen Therapy: Patient connected to face mask oxygen  Post-op Assessment: Report given to RN, Post -op Vital signs reviewed and stable, and Patient moving all extremities  Post vital signs: Reviewed and stable  Last Vitals:  Vitals Value Taken Time  BP 122/63 10/25/22 1500  Temp    Pulse 83 10/25/22 1503  Resp 17 10/25/22 1503  SpO2 98 % 10/25/22 1503  Vitals shown include unvalidated device data.  Last Pain:  Vitals:   10/25/22 1005  TempSrc: Oral  PainSc:          Complications: No notable events documented.

## 2022-10-25 NOTE — Anesthesia Preprocedure Evaluation (Addendum)
Anesthesia Evaluation  Patient identified by MRN, date of birth, ID band Patient awake    Reviewed: Allergy & Precautions, NPO status , Patient's Chart, lab work & pertinent test results, reviewed documented beta blocker date and time   Airway Mallampati: III  TM Distance: >3 FB Neck ROM: Full    Dental no notable dental hx. (+) Teeth Intact, Dental Advisory Given   Pulmonary neg pulmonary ROS   Pulmonary exam normal breath sounds clear to auscultation       Cardiovascular hypertension, Pt. on home beta blockers and Pt. on medications Normal cardiovascular exam Rhythm:Regular Rate:Normal  TTE 2021  1. Left ventricular ejection fraction, by estimation, is 55 to 60%. The  left ventricle has normal function. The left ventricle has no regional  wall motion abnormalities. There is mild concentric left ventricular  hypertrophy. Left ventricular diastolic  parameters were normal.   2. Right ventricular systolic function is normal. The right ventricular  size is normal. There is normal pulmonary artery systolic pressure. The  estimated right ventricular systolic pressure is 24.5 mmHg.   3. A small pericardial effusion is present. There is no evidence of  cardiac tamponade.   4. The mitral valve is normal in structure. Trivial mitral valve  regurgitation. No evidence of mitral stenosis.   5. The aortic valve is tricuspid. There is mild calcification of the  aortic valve. Aortic valve regurgitation is not visualized. Mild aortic  valve sclerosis is present, with no evidence of aortic valve stenosis.   6. The inferior vena cava is normal in size with greater than 50%  respiratory variability, suggesting right atrial pressure of 3 mmHg.     Neuro/Psych negative neurological ROS  negative psych ROS   GI/Hepatic negative GI ROS, Neg liver ROS,,,  Endo/Other  negative endocrine ROS    Renal/GU Renal InsufficiencyRenal diseaseLab  Results      Component                Value               Date                      CREATININE               1.63 (H)            10/25/2022                BUN                      25 (H)              10/25/2022                NA                       135                 10/25/2022                K                        5.3 (H)             10/25/2022                CL  103                 10/25/2022                CO2                      23                  10/25/2022             negative genitourinary   Musculoskeletal negative musculoskeletal ROS (+)    Abdominal   Peds  Hematology negative hematology ROS (+)   Anesthesia Other Findings   Reproductive/Obstetrics                             Anesthesia Physical Anesthesia Plan  ASA: 3  Anesthesia Plan: General   Post-op Pain Management: Tylenol PO (pre-op)*   Induction: Intravenous  PONV Risk Score and Plan: 2 and Dexamethasone, Ondansetron and Treatment may vary due to age or medical condition  Airway Management Planned: Oral ETT  Additional Equipment:   Intra-op Plan:   Post-operative Plan: Extubation in OR  Informed Consent: I have reviewed the patients History and Physical, chart, labs and discussed the procedure including the risks, benefits and alternatives for the proposed anesthesia with the patient or authorized representative who has indicated his/her understanding and acceptance.     Dental advisory given  Plan Discussed with: CRNA  Anesthesia Plan Comments:        Anesthesia Quick Evaluation

## 2022-10-25 NOTE — Consult Note (Signed)
Consulting Physician: Hyman Hopes Maalik Pinn  Referring Provider: Dr. Frederick Peers [facility]  Chief Complaint: Abdominal pain  Reason for Consult: Cholecystitis   Subjective   HPI: GERHARDT Olsen is an 81 y.o. male who is here for abdominal pain.  He ate a honey bun on Wednesday night, and developed severe abdominal pain.  It is located in epigastric area, and up to the chest.  It was to the point where he went to call an ambulance to come pick him up.  He went was evaluated by his primary care doctor and then referred to the emergency department.  Workup has been consistent with cholecystitis so a surgery consult was placed.  Past Medical History:  Diagnosis Date   HTN (hypertension)     Past Surgical History:  Procedure Laterality Date   PROSTATE SURGERY      No family history on file.  Social:  reports that he has never smoked. He has quit using smokeless tobacco.  His smokeless tobacco use included chew. He reports that he does not currently use alcohol. He reports that he does not use drugs.  Allergies: No Known Allergies  Medications: Current Outpatient Medications  Medication Instructions   aspirin 81 mg, Oral, Daily   atorvastatin (LIPITOR) 80 mg, Oral, Daily with lunch   metoprolol succinate (TOPROL-XL) 25 mg, Oral, Daily, Take with or immediately following a meal.   Multiple Vitamins-Minerals (CENTRUM SILVER 50+MEN PO) 1 tablet, Oral, Daily with breakfast   vitamin D3 2,000 Units, Oral, Daily with lunch    ROS - all of the below systems have been reviewed with the patient and positives are indicated with bold text General: chills, fever or night sweats Eyes: blurry vision or double vision ENT: epistaxis or sore throat Allergy/Immunology: itchy/watery eyes or nasal congestion Hematologic/Lymphatic: bleeding problems, blood clots or swollen lymph nodes Endocrine: temperature intolerance or unexpected weight changes Breast: new or changing breast lumps or nipple  discharge Resp: cough, shortness of breath, or wheezing CV: chest pain or dyspnea on exertion GI: as per HPI GU: dysuria, trouble voiding, or hematuria MSK: joint pain or joint stiffness Neuro: TIA or stroke symptoms Derm: pruritus and skin lesion changes Psych: anxiety and depression  Objective   PE Blood pressure 111/62, pulse 85, temperature 97.9 F (36.6 C), resp. rate 18, SpO2 91 %. Constitutional: NAD; conversant; no deformities Eyes: Moist conjunctiva; no lid lag; anicteric; PERRL Neck: Trachea midline; no thyromegaly Lungs: Normal respiratory effort; no tactile fremitus CV: RRR; no palpable thrills; no pitting edema GI: Abd soft, tender in the epigastric area and right upper quadrant; no palpable hepatosplenomegaly MSK: Normal range of motion of extremities; no clubbing/cyanosis Psychiatric: Appropriate affect; alert and oriented x3 Lymphatic: No palpable cervical or axillary lymphadenopathy  Results for orders placed or performed during the hospital encounter of 10/24/22 (from the past 24 hour(s))  CBC with Differential     Status: Abnormal   Collection Time: 10/24/22  3:38 PM  Result Value Ref Range   WBC 19.1 (H) 4.0 - 10.5 K/uL   RBC 4.54 4.22 - 5.81 MIL/uL   Hemoglobin 14.7 13.0 - 17.0 g/dL   HCT 50.2 77.4 - 12.8 %   MCV 96.9 80.0 - 100.0 fL   MCH 32.4 26.0 - 34.0 pg   MCHC 33.4 30.0 - 36.0 g/dL   RDW 78.6 76.7 - 20.9 %   Platelets 176 150 - 400 K/uL   nRBC 0.0 0.0 - 0.2 %   Neutrophils Relative % 91 %  Neutro Abs 17.4 (H) 1.7 - 7.7 K/uL   Lymphocytes Relative 3 %   Lymphs Abs 0.5 (L) 0.7 - 4.0 K/uL   Monocytes Relative 6 %   Monocytes Absolute 1.1 (H) 0.1 - 1.0 K/uL   Eosinophils Relative 0 %   Eosinophils Absolute 0.0 0.0 - 0.5 K/uL   Basophils Relative 0 %   Basophils Absolute 0.0 0.0 - 0.1 K/uL   Immature Granulocytes 0 %   Abs Immature Granulocytes 0.08 (H) 0.00 - 0.07 K/uL  Comprehensive metabolic panel     Status: Abnormal   Collection Time:  10/24/22  3:38 PM  Result Value Ref Range   Sodium 139 135 - 145 mmol/L   Potassium 3.3 (L) 3.5 - 5.1 mmol/L   Chloride 112 (H) 98 - 111 mmol/L   CO2 18 (L) 22 - 32 mmol/L   Glucose, Bld 193 (H) 70 - 99 mg/dL   BUN 27 (H) 8 - 23 mg/dL   Creatinine, Ser 6.28 (H) 0.61 - 1.24 mg/dL   Calcium 7.4 (L) 8.9 - 10.3 mg/dL   Total Protein 5.7 (L) 6.5 - 8.1 g/dL   Albumin 3.4 (L) 3.5 - 5.0 g/dL   AST 366 (H) 15 - 41 U/L   ALT 400 (H) 0 - 44 U/L   Alkaline Phosphatase 144 (H) 38 - 126 U/L   Total Bilirubin 1.8 (H) 0.3 - 1.2 mg/dL   GFR, Estimated 54 (L) >60 mL/min   Anion gap 9 5 - 15  Lipase, blood     Status: None   Collection Time: 10/24/22  3:38 PM  Result Value Ref Range   Lipase 32 11 - 51 U/L  Blood gas, venous (at [facility] and [facility], not at [facility])     Status: Abnormal   Collection Time: 10/24/22  3:38 PM  Result Value Ref Range   pH, Ven 7.34 7.25 - 7.43   pCO2, Ven 43 (L) 44 - 60 mmHg   pO2, Ven 33 32 - 45 mmHg   Bicarbonate 23.2 20.0 - 28.0 mmol/L   Acid-base deficit 2.6 (H) 0.0 - 2.0 mmol/L   O2 Saturation 48.2 %   Patient temperature 37.0   Lactic acid, plasma     Status: Abnormal   Collection Time: 10/24/22  3:38 PM  Result Value Ref Range   Lactic Acid, Venous 3.2 (HH) 0.5 - 1.9 mmol/L  I-stat chem 8, ED (not at [facility] or [facility])     Status: Abnormal   Collection Time: 10/24/22  3:49 PM  Result Value Ref Range   Sodium 138 135 - 145 mmol/L   Potassium 4.2 3.5 - 5.1 mmol/L   Chloride 105 98 - 111 mmol/L   BUN 28 (H) 8 - 23 mg/dL   Creatinine, Ser 2.94 (H) 0.61 - 1.24 mg/dL   Glucose, Bld 765 (H) 70 - 99 mg/dL   Calcium, Ion 4.65 (L) 1.15 - 1.40 mmol/L   TCO2 20 (L) 22 - 32 mmol/L   Hemoglobin 13.6 13.0 - 17.0 g/dL   HCT 03.5 46.5 - 68.1 %  Urinalysis, Routine w reflex microscopic     Status: Abnormal   Collection Time: 10/24/22  4:46 PM  Result Value Ref Range   Color, Urine YELLOW YELLOW   APPearance CLEAR CLEAR   Specific Gravity, Urine 1.010 1.005 - 1.030   pH 6.0 5.0 -  8.0   Glucose, UA >=500 (A) NEGATIVE mg/dL   Hgb urine dipstick NEGATIVE NEGATIVE   Bilirubin Urine NEGATIVE NEGATIVE  Ketones, ur NEGATIVE NEGATIVE mg/dL   Protein, ur NEGATIVE NEGATIVE mg/dL   Nitrite NEGATIVE NEGATIVE   Leukocytes,Ua NEGATIVE NEGATIVE   RBC / HPF 0-5 0 - 5 RBC/hpf   WBC, UA 0-5 0 - 5 WBC/hpf   Bacteria, UA RARE (A) NONE SEEN   Mucus PRESENT    Hyaline Casts, UA PRESENT   Lactic acid, plasma     Status: Abnormal   Collection Time: 10/24/22  5:27 PM  Result Value Ref Range   Lactic Acid, Venous 2.7 (HH) 0.5 - 1.9 mmol/L  Procalcitonin     Status: None   Collection Time: 10/25/22  5:40 AM  Result Value Ref Range   Procalcitonin 16.38 ng/mL  Comprehensive metabolic panel     Status: Abnormal   Collection Time: 10/25/22  5:40 AM  Result Value Ref Range   Sodium 135 135 - 145 mmol/L   Potassium 5.3 (H) 3.5 - 5.1 mmol/L   Chloride 103 98 - 111 mmol/L   CO2 23 22 - 32 mmol/L   Glucose, Bld 120 (H) 70 - 99 mg/dL   BUN 25 (H) 8 - 23 mg/dL   Creatinine, Ser 6.14 (H) 0.61 - 1.24 mg/dL   Calcium 8.4 (L) 8.9 - 10.3 mg/dL   Total Protein 6.1 (L) 6.5 - 8.1 g/dL   Albumin 3.2 (L) 3.5 - 5.0 g/dL   AST 431 (H) 15 - 41 U/L   ALT 794 (H) 0 - 44 U/L   Alkaline Phosphatase 189 (H) 38 - 126 U/L   Total Bilirubin 4.0 (H) 0.3 - 1.2 mg/dL   GFR, Estimated 42 (L) >60 mL/min   Anion gap 9 5 - 15  CBC     Status: Abnormal   Collection Time: 10/25/22  5:40 AM  Result Value Ref Range   WBC 19.3 (H) 4.0 - 10.5 K/uL   RBC 4.17 (L) 4.22 - 5.81 MIL/uL   Hemoglobin 13.4 13.0 - 17.0 g/dL   HCT 54.0 08.6 - 76.1 %   MCV 97.6 80.0 - 100.0 fL   MCH 32.1 26.0 - 34.0 pg   MCHC 32.9 30.0 - 36.0 g/dL   RDW 95.0 93.2 - 67.1 %   Platelets 125 (L) 150 - 400 K/uL   nRBC 0.0 0.0 - 0.2 %     Imaging Orders         DG Chest Portable 1 View         CT Angio Chest/Abd/Pel for Dissection W and/or Wo Contrast         US Abdomen Limited RUQ (LIVER/GB)         MR ABDOMEN MRCP W WO CONTAST       Assessment and Plan   JHOEL STIEG is an 81 y.o. male with abdominal pain, found on imaging to have acute cholecystitis.  I recommended laparoscopic cholecystectomy for the patient.  The procedure itself as well as its risk, benefits, and alternatives were discussed with patient in full agreement consent proceed.  Will proceed with operating room later today.    Quentin Ore, MD  [facility] Use [email or web address] to contact on call provider  New Patient Billing: [id number] - High MDM

## 2022-10-25 NOTE — Discharge Instructions (Signed)
[facility]  Please arrive at least 30 min before your appointment to complete your check in paperwork.  If you are unable to arrive 30 min prior to your appointment time we may have to cancel or reschedule you. LAPAROSCOPIC SURGERY: POST OP INSTRUCTIONS Always review your discharge instruction sheet given to you by the facility where your surgery was performed. IF YOU HAVE DISABILITY OR FAMILY LEAVE FORMS, YOU MUST BRING THEM TO THE OFFICE FOR PROCESSING.   DO NOT GIVE THEM TO YOUR DOCTOR.  PAIN CONTROL  First take acetaminophen (Tylenol) AND/or ibuprofen (Advil) to control your pain after surgery.  Follow directions on package.  Taking acetaminophen (Tylenol) and/or ibuprofen (Advil) regularly after surgery will help to control your pain and lower the amount of prescription pain medication you may need.  You should not take more than 4,000 mg (4 grams) of acetaminophen (Tylenol) in 24 hours.  You should not take ibuprofen (Advil), aleve, motrin, naprosyn or other NSAIDS if you have a history of stomach ulcers or chronic kidney disease.  A prescription for pain medication may be given to you upon discharge.  Take your pain medication as prescribed, if you still have uncontrolled pain after taking acetaminophen (Tylenol) or ibuprofen (Advil). Use ice packs to help control pain. If you need a refill on your pain medication, please contact your pharmacy.  They will contact our office to request authorization. Prescriptions will not be filled after 5pm or on week-ends.  HOME MEDICATIONS Take your usually prescribed medications unless otherwise directed.  DIET You should follow a light diet the first few days after arrival home.  Be sure to include lots of fluids daily. Avoid fatty, fried foods.   CONSTIPATION It is common to experience some constipation after surgery and if you are taking pain medication.  Increasing fluid intake and taking a stool softener (such as Colace)  will usually help or prevent this problem from occurring.  A mild laxative (Milk of Magnesia or Miralax) should be taken according to package instructions if there are no bowel movements after 48 hours.  WOUND/INCISION CARE Most patients will experience some swelling and bruising in the area of the incisions.  Ice packs will help.  Swelling and bruising can take several days to resolve.  Unless discharge instructions indicate otherwise, follow guidelines below  STERI-STRIPS - you may remove your outer bandages 48 hours after surgery, and you may shower at that time.  You have steri-strips (small skin tapes) in place directly over the incision.  These strips should be left on the skin for 7-10 days.   DERMABOND/SKIN GLUE - you may shower in 24 hours.  The glue will flake off over the next 2-3 weeks. Any sutures or staples will be removed at the office during your follow-up visit.  ACTIVITIES You may resume regular (light) daily activities beginning the next day--such as daily self-care, walking, climbing stairs--gradually increasing activities as tolerated.  You may have sexual intercourse when it is comfortable.  Refrain from any heavy lifting or straining until approved by your doctor. You may drive when you are no longer taking prescription pain medication, you can comfortably wear a seatbelt, and you can safely maneuver your car and apply brakes.  FOLLOW-UP You should see your doctor in the office for a follow-up appointment approximately 2-3 weeks after your surgery.  You should have been given your post-op/follow-up appointment when your surgery was scheduled.  If you did not receive a post-op/follow-up appointment, make sure   that you call for this appointment within a day or two after you arrive home to insure a convenient appointment time.   WHEN TO CALL YOUR DOCTOR: Fever over 101.0 Inability to urinate Continued bleeding from incision. Increased pain, redness, or drainage from the  incision. Increasing abdominal pain  The clinic staff is available to answer your questions during regular business hours.  Please don't hesitate to call and ask to speak to one of the nurses for clinical concerns.  If you have a medical emergency, go to the nearest emergency room or call 911.  A surgeon from [facility] is always on call at the hospital. [address], [address], [place], [place]  [address] ? [address] [address], [place], [place]   [address] [phone] ? [phone] ? FAX [phone]  

## 2022-10-26 ENCOUNTER — Encounter (HOSPITAL_COMMUNITY): Payer: Self-pay | Admitting: Internal Medicine

## 2022-10-26 DIAGNOSIS — R7989 Other specified abnormal findings of blood chemistry: Secondary | ICD-10-CM | POA: Insufficient documentation

## 2022-10-26 DIAGNOSIS — A419 Sepsis, unspecified organism: Secondary | ICD-10-CM | POA: Diagnosis not present

## 2022-10-26 DIAGNOSIS — J9601 Acute respiratory failure with hypoxia: Secondary | ICD-10-CM | POA: Diagnosis not present

## 2022-10-26 DIAGNOSIS — R6520 Severe sepsis without septic shock: Secondary | ICD-10-CM | POA: Diagnosis not present

## 2022-10-26 DIAGNOSIS — K810 Acute cholecystitis: Secondary | ICD-10-CM | POA: Diagnosis not present

## 2022-10-26 LAB — CBC WITH DIFFERENTIAL/PLATELET
Abs Immature Granulocytes: 0.13 10*3/uL — ABNORMAL HIGH (ref 0.00–0.07)
Basophils Absolute: 0 10*3/uL (ref 0.0–0.1)
Basophils Relative: 0 %
Eosinophils Absolute: 0 10*3/uL (ref 0.0–0.5)
Eosinophils Relative: 0 %
HCT: 38.3 % — ABNORMAL LOW (ref 39.0–52.0)
Hemoglobin: 12.6 g/dL — ABNORMAL LOW (ref 13.0–17.0)
Immature Granulocytes: 1 %
Lymphocytes Relative: 4 %
Lymphs Abs: 0.7 10*3/uL (ref 0.7–4.0)
MCH: 32.2 pg (ref 26.0–34.0)
MCHC: 32.9 g/dL (ref 30.0–36.0)
MCV: 98 fL (ref 80.0–100.0)
Monocytes Absolute: 0.7 10*3/uL (ref 0.1–1.0)
Monocytes Relative: 4 %
Neutro Abs: 16.4 10*3/uL — ABNORMAL HIGH (ref 1.7–7.7)
Neutrophils Relative %: 91 %
Platelets: 125 10*3/uL — ABNORMAL LOW (ref 150–400)
RBC: 3.91 MIL/uL — ABNORMAL LOW (ref 4.22–5.81)
RDW: 13.1 % (ref 11.5–15.5)
WBC: 17.9 10*3/uL — ABNORMAL HIGH (ref 4.0–10.5)
nRBC: 0 % (ref 0.0–0.2)

## 2022-10-26 LAB — COMPREHENSIVE METABOLIC PANEL
ALT: 470 U/L — ABNORMAL HIGH (ref 0–44)
AST: 362 U/L — ABNORMAL HIGH (ref 15–41)
Albumin: 3.3 g/dL — ABNORMAL LOW (ref 3.5–5.0)
Alkaline Phosphatase: 182 U/L — ABNORMAL HIGH (ref 38–126)
Anion gap: 9 (ref 5–15)
BUN: 27 mg/dL — ABNORMAL HIGH (ref 8–23)
CO2: 21 mmol/L — ABNORMAL LOW (ref 22–32)
Calcium: 8.7 mg/dL — ABNORMAL LOW (ref 8.9–10.3)
Chloride: 107 mmol/L (ref 98–111)
Creatinine, Ser: 1.75 mg/dL — ABNORMAL HIGH (ref 0.61–1.24)
GFR, Estimated: 38 mL/min — ABNORMAL LOW (ref >60)
Glucose, Bld: 168 mg/dL — ABNORMAL HIGH (ref 70–99)
Potassium: 4.1 mmol/L (ref 3.5–5.1)
Sodium: 137 mmol/L (ref 135–145)
Total Bilirubin: 4.1 mg/dL — ABNORMAL HIGH (ref 0.3–1.2)
Total Protein: 6.3 g/dL — ABNORMAL LOW (ref 6.5–8.1)

## 2022-10-26 LAB — MAGNESIUM: Magnesium: 1.9 mg/dL (ref 1.7–2.4)

## 2022-10-26 MED ORDER — LIP MEDEX EX OINT
TOPICAL_OINTMENT | Freq: Two times a day (BID) | CUTANEOUS | Status: DC
  Administered 2022-10-26: 75 via TOPICAL
  Filled 2022-10-26 (×2): qty 7

## 2022-10-26 MED ORDER — LACTATED RINGERS IV BOLUS: 1000.0000 mL | Freq: Three times a day (TID) | INTRAVENOUS | Status: DC | PRN

## 2022-10-26 MED ORDER — BISACODYL 10 MG RE SUPP: 10.0000 mg | Freq: Two times a day (BID) | RECTAL | Status: DC | PRN

## 2022-10-26 MED ORDER — PROCHLORPERAZINE EDISYLATE 10 MG/2ML IJ SOLN: 5.0000 mg | INTRAMUSCULAR | Status: DC | PRN

## 2022-10-26 MED ORDER — MENTHOL 3 MG MT LOZG: 1.0000 | LOZENGE | OROMUCOSAL | Status: DC | PRN

## 2022-10-26 MED ORDER — SODIUM CHLORIDE 0.9 % IV SOLN: 8.0000 mg | Freq: Four times a day (QID) | INTRAVENOUS | Status: DC | PRN

## 2022-10-26 MED ORDER — NAPHAZOLINE-GLYCERIN 0.012-0.25 % OP SOLN: 1.0000 [drp] | Freq: Four times a day (QID) | OPHTHALMIC | Status: DC | PRN

## 2022-10-26 MED ORDER — SALINE SPRAY 0.65 % NA SOLN: 1.0000 | Freq: Four times a day (QID) | NASAL | Status: DC | PRN

## 2022-10-26 MED ORDER — PHENOL 1.4 % MT LIQD: 2.0000 | OROMUCOSAL | Status: DC | PRN

## 2022-10-26 MED ORDER — SODIUM CHLORIDE 0.9 % IV SOLN: 250.0000 mL | INTRAVENOUS | Status: DC | PRN

## 2022-10-26 MED ORDER — MAGIC MOUTHWASH: 15.0000 mL | Freq: Four times a day (QID) | ORAL | Status: DC | PRN

## 2022-10-26 MED ORDER — CALCIUM POLYCARBOPHIL 625 MG PO TABS
625.0000 mg | ORAL_TABLET | Freq: Two times a day (BID) | ORAL | Status: DC
  Administered 2022-10-26 (×2): 625 mg via ORAL
  Filled 2022-10-26 (×3): qty 1

## 2022-10-26 MED ORDER — SODIUM CHLORIDE 0.9% FLUSH
3.0000 mL | Freq: Two times a day (BID) | INTRAVENOUS | Status: DC
  Administered 2022-10-26 (×2): 3 mL via INTRAVENOUS

## 2022-10-26 MED ORDER — METHOCARBAMOL 500 MG PO TABS: 1000.0000 mg | ORAL_TABLET | Freq: Four times a day (QID) | ORAL | Status: DC | PRN

## 2022-10-26 MED ORDER — ONDANSETRON HCL 4 MG/2ML IJ SOLN: 4.0000 mg | Freq: Four times a day (QID) | INTRAMUSCULAR | Status: DC | PRN

## 2022-10-26 MED ORDER — METHOCARBAMOL 1000 MG/10ML IJ SOLN: 1000.0000 mg | Freq: Four times a day (QID) | INTRAVENOUS | Status: DC | PRN

## 2022-10-26 MED ORDER — ALUM & MAG HYDROXIDE-SIMETH 200-200-20 MG/5ML PO SUSP: 30.0000 mL | Freq: Four times a day (QID) | ORAL | Status: DC | PRN

## 2022-10-26 MED ORDER — SODIUM CHLORIDE 0.9% FLUSH: 3.0000 mL | INTRAVENOUS | Status: DC | PRN

## 2022-10-26 NOTE — TOC Initial Note (Signed)
Transition of Care [facility]) - Initial/Assessment Note    Patient Details  Name: Larry Olsen MRN: [id number] Date of Birth: [birth year]  Transition of Care [facility]) CM/SW Contact:    Golda Acre, RN Phone Number: 10/26/2022, 10:01 AM  Clinical Narrative:                  Transition of Care [facility]) Screening Note   Patient Details  Name: Larry Olsen Date of Birth: 11/12/1941   Transition of Care [facility]) CM/SW Contact:    Golda Acre, RN Phone Number: 10/26/2022, 10:01 AM    Transition of Care Department [facility]) has reviewed patient and no TOC needs have been identified at this time. We will continue to monitor patient advancement through interdisciplinary progression rounds. If new patient transition needs arise, please place a TOC consult.    Expected Discharge Plan: Home/Self Care Barriers to Discharge: Continued Medical Work up   Patient Goals and CMS Choice Patient states their goals for this hospitalization and ongoing recovery are:: to return home CMS Medicare.gov Compare Post Acute Care list provided to:: Patient Choice offered to / list presented to : Patient  Expected Discharge Plan and Services Expected Discharge Plan: Home/Self Care   Discharge Planning Services: CM Consult   Living arrangements for the past 2 months: Single Family Home                                      Prior Living Arrangements/Services Living arrangements for the past 2 months: Single Family Home Lives with:: Self Patient language and need for interpreter reviewed:: Yes Do you feel safe going back to the place where you live?: Yes            Criminal Activity/Legal Involvement Pertinent to Current Situation/Hospitalization: No - Comment as needed  Activities of Daily Living Home Assistive Devices/Equipment: None ADL Screening (condition at time of admission) Patient's cognitive ability adequate to safely complete daily activities?: Yes Is the patient deaf or  have difficulty hearing?: No Does the patient have difficulty seeing, even when wearing glasses/contacts?: No Does the patient have difficulty concentrating, remembering, or making decisions?: No Patient able to express need for assistance with ADLs?: Yes Does the patient have difficulty dressing or bathing?: No Independently performs ADLs?: No Communication: Independent Dressing (OT): Needs assistance Is this a change from baseline?: Change from baseline, expected to last <3days Grooming: Needs assistance Is this a change from baseline?: Change from baseline, expected to last <3 days Feeding: Independent Bathing: Needs assistance Is this a change from baseline?: Change from baseline, expected to last <3 days Toileting: Needs assistance Is this a change from baseline?: Change from baseline, expected to last <3 days In/Out Bed: Needs assistance Is this a change from baseline?: Change from baseline, expected to last <3 days Walks in Home: Independent Does the patient have difficulty walking or climbing stairs?: No Weakness of Legs: None Weakness of Arms/Hands: None  Permission Sought/Granted                  Emotional Assessment Appearance:: Appears stated age Attitude/Demeanor/Rapport: Engaged Affect (typically observed): Calm Orientation: : Oriented to Self, Oriented to Place, Oriented to  Time, Oriented to Situation Alcohol / Substance Use: Not Applicable Psych Involvement: No (comment)  Admission diagnosis:  Right upper quadrant abdominal pain [R10.11] Sepsis (HCC) [A41.9] Patient Active Problem List   Diagnosis Date Noted  Severe sepsis (HCC) 10/24/2022   Acute cholecystitis 10/24/2022   Chronic kidney disease, stage 3a (HCC) 10/24/2022   Essential hypertension 10/24/2022   Hyperlipidemia 10/24/2022   Hypokalemia 10/24/2022   Acute respiratory failure with hypoxia (HCC) 10/24/2022   Hypocalcemia 10/24/2022   Pneumonia due to COVID-19 virus 09/12/2020    Hyponatremia 09/12/2020   ARF (acute renal failure) (HCC) 09/12/2020   Demand ischemia 09/12/2020   PCP:  Daisy Floro, MD Pharmacy:   [id number] - [place], [place] - [address] [place] [address] Phone: [phone] Fax: [phone]     Social Determinants of Health (SDOH) Interventions    Readmission Risk Interventions   No data to display

## 2022-10-26 NOTE — Progress Notes (Signed)
Mobility Specialist - Progress Note   10/26/22 1200  Mobility  Activity Ambulated independently in hallway  Level of Assistance Independent  Assistive Device None  Distance Ambulated (ft) 500 ft  Range of Motion/Exercises Active  Activity Response Tolerated well  Mobility Referral Yes  $Mobility charge 1 Mobility   Pt was found in bed and agreeable to ambulate. Had no complaints during ambulation and at EOS was left in room with wife. RN notified of session.  Billey Chang Mobility Specialist

## 2022-10-26 NOTE — Progress Notes (Signed)
[facility] Progress Note  SUBJECTIVE:   Interval history: Larry Olsen was seen and evaluated today at bedside.  Resting comfortably in bed with daughter at bedside.  He appeared fairly frustrated that his diet was not advanced.  He is frustrated that there are multiple medical teams and not 1 sole medical provider for his current admission.  He tolerated clear liquids well.  Has nausea although no vomiting.  No chest pain or shortness of breath.  He has not had a bowel movement since surgery.    Encouraged that liver enzymes are improved today, bilirubin remains similar to prior.  Per review of records, intraoperative cholangiogram was not completed during cholecystectomy.  He is having some incisional pain after surgery though no other abdominal pain. Had at length discussion with Larry Olsen and his daughter at bedside regarding his liver enzyme elevation.  Prior MRCP testing did not reveal choledocholithiasis.  Liver enzymes are improving, though not completely normalized.  Past Medical History:  Diagnosis Date   HTN (hypertension)    Past Surgical History:  Procedure Laterality Date   PROSTATE SURGERY     Current Facility-Administered Medications  Medication Dose Route Frequency Provider Last Rate Last Admin   0.9 %  sodium chloride infusion  250 mL Intravenous PRN Karie Soda, MD       acetaminophen (TYLENOL) tablet 650 mg  650 mg Oral Q6H PRN Eric Form, PA-C   650 mg at 10/25/22 2231   alum & mag hydroxide-simeth (MAALOX/MYLANTA) 200-200-20 MG/5ML suspension 30 mL  30 mL Oral Q6H PRN Karie Soda, MD       bisacodyl (DULCOLAX) suppository 10 mg  10 mg Rectal Q12H PRN Karie Soda, MD       docusate sodium (COLACE) capsule 100 mg  100 mg Oral BID Eric Form, PA-C   100 mg at 10/26/22 1047   heparin injection 5,000 Units  5,000 Units Subcutaneous Q8H Eric Form, PA-C   5,000 Units at 10/26/22 1307   HYDROmorphone (DILAUDID) injection 1 mg  1 mg  Intravenous Q3H PRN Eric Form, PA-C       lactated ringers bolus 1,000 mL  1,000 mL Intravenous Q8H PRN Karie Soda, MD       lip balm (CARMEX) ointment   Topical BID Karie Soda, MD       magic mouthwash  15 mL Oral QID PRN Karie Soda, MD       menthol-cetylpyridinium (CEPACOL) lozenge 3 mg  1 lozenge Oral PRN Karie Soda, MD       methocarbamol (ROBAXIN) 1,000 mg in dextrose 5 % 100 mL IVPB  1,000 mg Intravenous Q6H PRN Karie Soda, MD       methocarbamol (ROBAXIN) tablet 1,000 mg  1,000 mg Oral Q6H PRN Karie Soda, MD       naphazoline-glycerin (CLEAR EYES REDNESS) ophth solution 1-2 drop  1-2 drop Both Eyes QID PRN Karie Soda, MD       ondansetron [facility]) injection 4 mg  4 mg Intravenous Q6H PRN Karie Soda, MD       Or   ondansetron (ZOFRAN) 8 mg in sodium chloride 0.9 % 50 mL IVPB  8 mg Intravenous Q6H PRN Karie Soda, MD       oxyCODONE (Oxy IR/ROXICODONE) immediate release tablet 2.5-5 mg  2.5-5 mg Oral Q4H PRN Carl Best H, PA-C       phenol (CHLORASEPTIC) mouth spray 2 spray  2 spray Mouth/Throat PRN Karie Soda, MD  polycarbophil (FIBERCON) tablet 625 mg  625 mg Oral BID Karie Soda, MD   625 mg at 10/26/22 1300   prochlorperazine (COMPAZINE) injection 5-10 mg  5-10 mg Intravenous Q4H PRN Karie Soda, MD       sodium chloride (OCEAN) 0.65 % nasal spray 1-2 spray  1-2 spray Each Nare Q6H PRN Karie Soda, MD       sodium chloride flush (NS) 0.9 % injection 3 mL  3 mL Intravenous Q12H Eric Form, PA-C   3 mL at 10/25/22 2233   sodium chloride flush (NS) 0.9 % injection 3 mL  3 mL Intravenous Catha Gosselin, MD       sodium chloride flush (NS) 0.9 % injection 3 mL  3 mL Intravenous PRN Karie Soda, MD       Allergies as of 10/24/2022   (No Known Allergies)   Review of Systems:  Review of Systems  Respiratory:  Negative for shortness of breath.   Cardiovascular:  Negative for chest pain.  Gastrointestinal:  Positive for  nausea. Negative for abdominal pain, constipation, diarrhea and vomiting.    OBJECTIVE:   Temp:  [97.5 F (36.4 C)-98.9 F (37.2 C)] 97.5 F (36.4 C) (12/09 1324) Pulse Rate:  [77-88] 86 (12/09 1324) Resp:  [16-23] 16 (12/09 1324) BP: (120-145)/(55-85) 142/85 (12/09 1324) SpO2:  [93 %-100 %] 96 % (12/09 1324) Last BM Date : 10/25/22 Physical Exam Constitutional:      General: He is not in acute distress.    Appearance: He is not ill-appearing, toxic-appearing or diaphoretic.  Cardiovascular:     Rate and Rhythm: Normal rate and regular rhythm.  Pulmonary:     Effort: No respiratory distress.     Breath sounds: Normal breath sounds.     Comments: No supplemental oxygen in place Abdominal:     General: There is no distension.     Palpations: Abdomen is soft.     Tenderness: There is abdominal tenderness (Epigastric). There is no guarding.  Skin:    General: Skin is warm and dry.  Neurological:     Mental Status: He is alert.     Labs: Recent Labs    10/24/22 1538 10/24/22 1549 10/25/22 0540 10/25/22 1310 10/26/22 0557  WBC 19.1*  --  19.3*  --  17.9*  HGB 14.7   < > 13.4 12.2* 12.6*  HCT 44.0   < > 40.7 36.0* 38.3*  PLT 176  --  125*  --  125*   < > = values in this interval not displayed.   BMET Recent Labs    10/24/22 1538 10/24/22 1549 10/25/22 0540 10/25/22 1310 10/26/22 0557  NA 139   < > 135 138 137  K 3.3*   < > 5.3* 4.4 4.1  CL 112*   < > 103 105 107  CO2 18*  --  23  --  21*  GLUCOSE 193*   < > 120* 109* 168*  BUN 27*   < > 25* 22 27*  CREATININE 1.32*   < > 1.63* 1.70* 1.75*  CALCIUM 7.4*  --  8.4*  --  8.7*   < > = values in this interval not displayed.   LFT Recent Labs    10/26/22 0557  PROT 6.3*  ALBUMIN 3.3*  AST 362*  ALT 470*  ALKPHOS 182*  BILITOT 4.1*   PT/INR No results for input(s): "LABPROT", "INR" in the last 72 hours. Diagnostic imaging: MR ABDOMEN MRCP W WO CONTAST  Result Date: 10/25/2022 CLINICAL DATA:  Right  upper quadrant abdominal pain. Biliary disease suspected. EXAM: MRI ABDOMEN WITHOUT AND WITH CONTRAST (INCLUDING MRCP) TECHNIQUE: Multiplanar multisequence MR imaging of the abdomen was performed both before and after the administration of intravenous contrast. Heavily T2-weighted images of the biliary and pancreatic ducts were obtained, and three-dimensional MRCP images were rendered by post processing. CONTRAST:  61mL GADAVIST GADOBUTROL 1 MMOL/ML IV SOLN COMPARISON:  CTA chest abdomen and pelvis earlier same day FINDINGS: Lower chest: The bibasilar scarring and bronchiectasis described on CT scan earlier today is not well demonstrated by MRI. Hepatobiliary: No suspicious focal abnormality within the liver parenchyma. Gallbladder is distended with tiny layering gallstones evident (see axial T2 haste image 18 of series 4). Trace pericholecystic fluid evident. No intra or extrahepatic biliary duct dilatation MRCP imaging is degraded by motion artifact but within this limitation, no choledocholithiasis evident. Pancreas: No focal mass lesion. No dilatation of the main duct. No intraparenchymal cyst. No peripancreatic edema. Spleen:  No splenomegaly. No focal mass lesion. Adrenals/Urinary Tract: No adrenal nodule or mass. Kidneys unremarkable. Stomach/Bowel: Stomach is nondistended. There is trace fluid around the descending duodenum No small bowel or colonic dilatation within the visualized abdomen. Vascular/Lymphatic: No abdominal aortic aneurysm. No abdominal lymphadenopathy. Other:  No substantial intraperitoneal free fluid. Musculoskeletal: No focal suspicious marrow enhancement within the visualized bony anatomy. IMPRESSION: 1. Distended gallbladder with tiny layering gallstones. Trace pericholecystic fluid evident. No intra or extrahepatic biliary duct dilatation. No choledocholithiasis evident. Acute cholecystitis would be a distinct consideration. If clinical picture is equivocal, nuclear scintigraphy may  prove helpful to assess for cystic duct obstruction. 2. Trace fluid around the descending duodenum, likely related to gallbladder findings. 3. Bibasilar scarring and bronchiectasis described on CT scan earlier today is not well demonstrated by MRI. Electronically Signed   By: Kennith Center M.D.   On: 10/25/2022 05:23   US Abdomen Limited RUQ (LIVER/GB)  Result Date: 10/24/2022 CLINICAL DATA:  Distended gallbladder on CT EXAM: ULTRASOUND ABDOMEN LIMITED RIGHT UPPER QUADRANT COMPARISON:  None Available. FINDINGS: Gallbladder: Small amount of gallbladder sludge. No shadowing stones. Normal wall thickness. Negative sonographic Murphy. Gallbladder is distended. Common bile duct: Diameter: 2.6 mm Liver: No focal lesion identified. Within normal limits in parenchymal echogenicity. Portal vein is patent on color Doppler imaging with normal direction of blood flow towards the liver. Other: None. IMPRESSION: Distended gallbladder with small amount of sludge. No shadowing stones. Negative for biliary dilatation. Electronically Signed   By: Jasmine Pang M.D.   On: 10/24/2022 17:52   CT Angio Chest/Abd/Pel for Dissection W and/or Wo Contrast  Result Date: 10/24/2022 CLINICAL DATA:  Acute aortic syndrome suspected EXAM: CT ANGIOGRAPHY CHEST, ABDOMEN AND PELVIS TECHNIQUE: Non-contrast CT of the chest was initially obtained. Multidetector CT imaging through the chest, abdomen and pelvis was performed using the standard protocol during bolus administration of intravenous contrast. Multiplanar reconstructed images and MIPs were obtained and reviewed to evaluate the vascular anatomy. RADIATION DOSE REDUCTION: This exam was performed according to the departmental dose-optimization program which includes automated exposure control, adjustment of the mA and/or kV according to patient size and/or use of iterative reconstruction technique. CONTRAST:  OMNIPAQUE IOHEXOL 350 MG/ML SOLN COMPARISON:  CT chest angiogram,  09/12/2020 FINDINGS: CTA CHEST FINDINGS VASCULAR Aorta: Satisfactory opacification of the aorta. Normal contour and caliber of the thoracic aorta. No evidence of aneurysm, dissection, or other acute aortic pathology. Moderate mixed calcific atherosclerosis. Cardiovascular: No evidence of pulmonary embolism on limited  non-tailored examination. Normal heart size. Three-vessel coronary artery calcifications. No pericardial effusion. Review of the MIP images confirms the above findings. NON VASCULAR Mediastinum/Nodes: No enlarged mediastinal, hilar, or axillary lymph nodes. Thyroid gland, trachea, and esophagus demonstrate no significant findings. Lungs/Pleura: Extensive dependent bibasilar scarring and bronchiectasis (series 4, image 92). No pleural effusion or pneumothorax. Musculoskeletal: No chest wall abnormality. No acute osseous findings. Review of the MIP images confirms the above findings. CTA ABDOMEN AND PELVIS FINDINGS VASCULAR Normal contour and caliber of the abdominal aorta. No evidence of aneurysm, dissection, or other acute aortic pathology. Standard branching pattern of the abdominal aorta with solitary bilateral renal arteries. Review of the MIP images confirms the above findings. NON-VASCULAR Hepatobiliary: No solid liver abnormality is seen. Mildly distended gallbladder. No gallstones, gallbladder wall thickening, or biliary dilatation. Pancreas: Unremarkable. No pancreatic ductal dilatation or surrounding inflammatory changes. Spleen: Normal in size without significant abnormality. Adrenals/Urinary Tract: Adrenal glands are unremarkable. Kidneys are normal, without renal calculi, solid lesion, or hydronephrosis. Bladder is unremarkable. Stomach/Bowel: Stomach is within normal limits. Appendix is not clearly visualized. No evidence of bowel wall thickening, distention, or inflammatory changes. Sigmoid diverticulosis. Lymphatic: No enlarged abdominal or pelvic lymph nodes. Reproductive: Status post  prostatectomy. Other: No abdominal wall hernia or abnormality. No ascites. Musculoskeletal: No acute osseous findings. IMPRESSION: 1. Normal contour and caliber of the thoracic and abdominal aorta. No evidence of aneurysm, dissection, or other acute aortic pathology. Moderate mixed calcific atherosclerosis. 2. 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 3. Coronary artery disease. 4. Sigmoid diverticulosis without evidence of acute diverticulitis. 5. Status post prostatectomy. Aortic Atherosclerosis (ICD10-I70.0). Electronically Signed   By: Jearld LeschAlex D Bibbey M.D.   On: 10/24/2022 17:18   DG Chest Portable 1 View  Result Date: 10/24/2022 CLINICAL DATA:  Abdominal distension.  Evaluate for free air. EXAM: PORTABLE CHEST 1 VIEW COMPARISON:  Chest radiographs 12/10/2019 and 09/15/2020. Chest CT 09/12/2020. FINDINGS: 1457 hours. AP semi erect view demonstrates cardiomegaly, probable pulmonary edema and small bilateral pleural effusions. There is no confluent airspace opacity or pneumothorax. No evidence of pneumoperitoneum on this view. Aortic atherosclerosis noted. There are degenerative changes within the spine without evidence of acute osseous abnormality. Numerous telemetry leads overlie the chest and upper abdomen. IMPRESSION: 1. No evidence of pneumoperitoneum. 2. Cardiomegaly with probable pulmonary edema and small bilateral pleural effusions suspicious for recurrent congestive heart failure. Electronically Signed   By: Carey BullocksWilliam  Veazey M.D.   On: 10/24/2022 15:38    IMPRESSION: Cholecystitis status post laparoscopic cholecystectomy 10/25/22  -No intraoperative cholangiogram completed per chart review Transaminase elevation in mixed pattern, improved  -Suspect multifactorial in setting of hypotension on presentation as well as cholecystitis Hyperbilirubinemia  -MRCP 10/24/22 with no choledocholithiasis Abdominal pain,  improved History chronic kidney disease History prostate cancer  PLAN: -Liver enzyme trend improving, suspect bilirubin lagging in improvement -Abdominal pain improved after surgical intervention -Recommend repeat liver enzyme panel in AM 12/10 -If bilirubin continues to be elevated, will discuss case with advanced endoscopy provider and consider abdominal ultrasound versus repeat MRCP to evaluate for choledocholithiasis -If bilirubin improved, likely stable for discharge from GI standpoint with follow up labs through PCP -Diet per surgery team recommendations -Patient/daughter's questions answered to the best of my ability -[facility] will follow   LOS: 2 days   Liliane Shilaire Jadzia Ibsen, [facility] CenterDO [facility]

## 2022-10-26 NOTE — Progress Notes (Signed)
Progress Note    Larry RIETH   Olsen:[id number]  DOB: 03/18/1941  DOA: 10/24/2022     2 PCP: Lawerance Cruel, MD  Initial CC: abd pain  Hospital Course: Larry Olsen is a 81 y.o. male with medical history significant for HTN, HLD, CKD stage IIIa, prostate cancer s/p prostatectomy who is admitted with sepsis and elevated LFTs.  He underwent multiple imaging studies on admission.  Ultimately there was concern for acute cholecystitis with a distended gallbladder noted and trace pericholecystic fluid found on MRCP.  No choledocholithiasis was present. General surgery was consulted and patient was recommended for cholecystectomy.  Interval History:  Underwent lap CCY on 10/25/22 and tolerated well. LFTs improving some today and he appears to be feeling improved. Remain 1 more night per surgery and repeat LFTs again in am.   Assessment and Plan: * Severe sepsis (HCC)-resolved as of 10/26/2022 Presenting with leukocytosis, tachypnea, elevated LFTs, lactic acidosis with presumed intra-abdominal infectious source. -Zosyn discontinued per surgery  Acute cholecystitis - MRCP shows distended gallbladder with small layering stones.  Trace pericholecystic fluid with no ductal dilatation nor choledocholithiasis. -Evaluated by general surgery and underwent lap cholecystectomy on 10/25/2022 -LFTs elevated on admission and now downtrending after cholecystectomy  Acute respiratory failure with hypoxia (HCC)-resolved as of 10/26/2022 - Small bilateral pleural effusions noted on CXR.  Also some concern in the past for patient having some possible aspiration -Remains nontoxic-appearing - Remains on oxygen - Okay to discontinue antibiotics from a respiratory standpoint as well  Chronic kidney disease, stage 3a ([place]) - patient has history of CKD3a. Baseline creat ~ 1.5-1.6, eGFR 48   Hypocalcemia - Replete as needed  Hypokalemia - Replete as needed  Hyperlipidemia Holding statin due to  elevated LFTs.  Essential hypertension Hold Toprol-XL for now as he has had some low blood pressures.   Old records reviewed in assessment of this patient  Antimicrobials: Zosyn 10/24/2022 >> 10/26/22  DVT prophylaxis:  Place and maintain sequential compression device Start: 10/25/22 1621 heparin injection 5,000 Units Start: 10/24/22 2200   Code Status:   Code Status: Full Code  Mobility Assessment (last 72 hours)     Mobility Assessment     Row Name 10/26/22 0745 10/25/22 2035 10/25/22 1628 10/25/22 02:15:40     Does patient have an order for bedrest or is patient medically unstable No - Continue assessment No - Continue assessment No - Continue assessment No - Continue assessment    What is the highest level of mobility based on the progressive mobility assessment? Level 5 (Walks with assist in room/hall) - Balance while stepping forward/back and can walk in room with assist - Complete Level 6 (Walks independently in room and hall) - Balance while walking in room without assist - Complete Level 4 (Walks with assist in room) - Balance while marching in place and cannot step forward and back - Complete Level 6 (Walks independently in room and hall) - Balance while walking in room without assist - Complete             Barriers to discharge:  Disposition Plan:  Home Sunday Status is: INpt  Objective: Blood pressure (!) 143/74, pulse 81, temperature 98.2 F (36.8 C), temperature source Oral, resp. rate 16, SpO2 97 %.  Examination:  Physical Exam Constitutional:      General: He is not in acute distress.    Appearance: Normal appearance.  HENT:     Head: Normocephalic and atraumatic.     Mouth/Throat:  Mouth: Mucous membranes are moist.  Eyes:     Extraocular Movements: Extraocular movements intact.  Cardiovascular:     Rate and Rhythm: Normal rate and regular rhythm.     Heart sounds: Normal heart sounds.  Pulmonary:     Effort: Pulmonary effort is normal. No  respiratory distress.     Breath sounds: No wheezing or rhonchi.  Abdominal:     General: Bowel sounds are normal. There is no distension.     Palpations: Abdomen is soft.  Musculoskeletal:        General: Normal range of motion.     Cervical back: Normal range of motion and neck supple.  Skin:    General: Skin is warm and dry.  Neurological:     General: No focal deficit present.     Mental Status: He is alert.  Psychiatric:        Mood and Affect: Mood normal.        Behavior: Behavior normal.      Consultants:  GI General surgery  Procedures:  10/25/22, CCY  Data Reviewed: Results for orders placed or performed during the hospital encounter of 10/24/22 (from the past 24 hour(s))  I-STAT, chem 8     Status: Abnormal   Collection Time: 10/25/22  1:10 PM  Result Value Ref Range   Sodium 138 135 - 145 mmol/L   Potassium 4.4 3.5 - 5.1 mmol/L   Chloride 105 98 - 111 mmol/L   BUN 22 8 - 23 mg/dL   Creatinine, Ser 1.70 (H) 0.61 - 1.24 mg/dL   Glucose, Bld 109 (H) 70 - 99 mg/dL   Calcium, Ion 1.14 (L) 1.15 - 1.40 mmol/L   TCO2 22 22 - 32 mmol/L   Hemoglobin 12.2 (L) 13.0 - 17.0 g/dL   HCT 36.0 (L) 39.0 - 52.0 %  CBC with Differential/Platelet     Status: Abnormal   Collection Time: 10/26/22  5:57 AM  Result Value Ref Range   WBC 17.9 (H) 4.0 - 10.5 K/uL   RBC 3.91 (L) 4.22 - 5.81 MIL/uL   Hemoglobin 12.6 (L) 13.0 - 17.0 g/dL   HCT 38.3 (L) 39.0 - 52.0 %   MCV 98.0 80.0 - 100.0 fL   MCH 32.2 26.0 - 34.0 pg   MCHC 32.9 30.0 - 36.0 g/dL   RDW 13.1 11.5 - 15.5 %   Platelets 125 (L) 150 - 400 K/uL   nRBC 0.0 0.0 - 0.2 %   Neutrophils Relative % 91 %   Neutro Abs 16.4 (H) 1.7 - 7.7 K/uL   Lymphocytes Relative 4 %   Lymphs Abs 0.7 0.7 - 4.0 K/uL   Monocytes Relative 4 %   Monocytes Absolute 0.7 0.1 - 1.0 K/uL   Eosinophils Relative 0 %   Eosinophils Absolute 0.0 0.0 - 0.5 K/uL   Basophils Relative 0 %   Basophils Absolute 0.0 0.0 - 0.1 K/uL   Immature Granulocytes 1  %   Abs Immature Granulocytes 0.13 (H) 0.00 - 0.07 K/uL  Comprehensive metabolic panel     Status: Abnormal   Collection Time: 10/26/22  5:57 AM  Result Value Ref Range   Sodium 137 135 - 145 mmol/L   Potassium 4.1 3.5 - 5.1 mmol/L   Chloride 107 98 - 111 mmol/L   CO2 21 (L) 22 - 32 mmol/L   Glucose, Bld 168 (H) 70 - 99 mg/dL   BUN 27 (H) 8 - 23 mg/dL   Creatinine, Ser 1.75 (H) 0.61 -  1.24 mg/dL   Calcium 8.7 (L) 8.9 - 10.3 mg/dL   Total Protein 6.3 (L) 6.5 - 8.1 g/dL   Albumin 3.3 (L) 3.5 - 5.0 g/dL   AST 362 (H) 15 - 41 U/L   ALT 470 (H) 0 - 44 U/L   Alkaline Phosphatase 182 (H) 38 - 126 U/L   Total Bilirubin 4.1 (H) 0.3 - 1.2 mg/dL   GFR, Estimated 38 (L) >60 mL/min   Anion gap 9 5 - 15  Magnesium     Status: None   Collection Time: 10/26/22  5:57 AM  Result Value Ref Range   Magnesium 1.9 1.7 - 2.4 mg/dL    I have Reviewed nursing notes, Vitals, and Lab results since pt's last encounter. Pertinent lab results : see above I have ordered test including BMP, CBC, Mg I have reviewed the last note from staff over past 24 hours I have discussed pt's care plan and test results with nursing staff, case manager  Time spent: Greater than 50% of the 55 minute visit was spent in counseling/coordination of care for the patient as laid out in the A&P.    LOS: 2 days   Dwyane Dee, MD [facility] 10/26/2022, 1:09 PM

## 2022-10-26 NOTE — Progress Notes (Signed)
1 Day Post-Op   Subjective/Chief Complaint: Patient feels better, eager to go home Passing some flatus, no BM LFT's still elevated Reports urine is very dark  Objective: Vital signs in last 24 hours: Temp:  [97.8 F (36.6 C)-98.9 F (37.2 C)] 97.8 F (36.6 C) (12/09 0544) Pulse Rate:  [77-88] 85 (12/09 0544) Resp:  [15-23] 18 (12/09 0544) BP: (120-145)/(55-85) 145/85 (12/09 0544) SpO2:  [93 %-100 %] 94 % (12/09 0544) Last BM Date : 10/25/22  Intake/Output from previous day: 12/08 0701 - 12/09 0700 In: 1653 [P.O.:240; I.V.:1413] Out: 550 [Urine:500; Blood:50] Intake/Output this shift: No intake/output data recorded.  WDWN in NAD No clinical jaundice Abd - soft, incisional tenderness Incisions c/d/i  Lab Results:  Recent Labs    10/25/22 0540 10/25/22 1310 10/26/22 0557  WBC 19.3*  --  17.9*  HGB 13.4 12.2* 12.6*  HCT 40.7 36.0* 38.3*  PLT 125*  --  125*   BMET Recent Labs    10/25/22 0540 10/25/22 1310 10/26/22 0557  NA 135 138 137  K 5.3* 4.4 4.1  CL 103 105 107  CO2 23  --  21*  GLUCOSE 120* 109* 168*  BUN 25* 22 27*  CREATININE 1.63* 1.70* 1.75*  CALCIUM 8.4*  --  8.7*      Latest Ref Rng & Units 10/26/2022    5:57 AM 10/25/2022    5:40 AM 10/24/2022    3:38 PM  Hepatic Function  Total Protein 6.5 - 8.1 g/dL 6.3  6.1  5.7   Albumin 3.5 - 5.0 g/dL 3.3  3.2  3.4   AST 15 - 41 U/L 362  743  606   ALT 0 - 44 U/L 470  794  400   Alk Phosphatase 38 - 126 U/L 182  189  144   Total Bilirubin 0.3 - 1.2 mg/dL 4.1  4.0  1.8     ABG Recent Labs    10/24/22 1538  HCO3 23.2    Studies/Results: MR ABDOMEN MRCP W WO CONTAST  Result Date: 10/25/2022 CLINICAL DATA:  Right upper quadrant abdominal pain. Biliary disease suspected. EXAM: MRI ABDOMEN WITHOUT AND WITH CONTRAST (INCLUDING MRCP) TECHNIQUE: Multiplanar multisequence MR imaging of the abdomen was performed both before and after the administration of intravenous contrast. Heavily T2-weighted  images of the biliary and pancreatic ducts were obtained, and three-dimensional MRCP images were rendered by post processing. CONTRAST:  34m GADAVIST GADOBUTROL 1 MMOL/ML IV SOLN COMPARISON:  CTA chest abdomen and pelvis earlier same day FINDINGS: Lower chest: The bibasilar scarring and bronchiectasis described on CT scan earlier today is not well demonstrated by MRI. Hepatobiliary: No suspicious focal abnormality within the liver parenchyma. Gallbladder is distended with tiny layering gallstones evident (see axial T2 haste image 18 of series 4). Trace pericholecystic fluid evident. No intra or extrahepatic biliary duct dilatation MRCP imaging is degraded by motion artifact but within this limitation, no choledocholithiasis evident. Pancreas: No focal mass lesion. No dilatation of the main duct. No intraparenchymal cyst. No peripancreatic edema. Spleen:  No splenomegaly. No focal mass lesion. Adrenals/Urinary Tract: No adrenal nodule or mass. Kidneys unremarkable. Stomach/Bowel: Stomach is nondistended. There is trace fluid around the descending duodenum No small bowel or colonic dilatation within the visualized abdomen. Vascular/Lymphatic: No abdominal aortic aneurysm. No abdominal lymphadenopathy. Other:  No substantial intraperitoneal free fluid. Musculoskeletal: No focal suspicious marrow enhancement within the visualized bony anatomy. IMPRESSION: 1. Distended gallbladder with tiny layering gallstones. Trace pericholecystic fluid evident. No intra or extrahepatic  biliary duct dilatation. No choledocholithiasis evident. Acute cholecystitis would be a distinct consideration. If clinical picture is equivocal, nuclear scintigraphy may prove helpful to assess for cystic duct obstruction. 2. Trace fluid around the descending duodenum, likely related to gallbladder findings. 3. Bibasilar scarring and bronchiectasis described on CT scan earlier today is not well demonstrated by MRI. Electronically Signed   By: Misty Stanley M.D.   On: 10/25/2022 05:23   US Abdomen Limited RUQ (LIVER/GB)  Result Date: 10/24/2022 CLINICAL DATA:  Distended gallbladder on CT EXAM: ULTRASOUND ABDOMEN LIMITED RIGHT UPPER QUADRANT COMPARISON:  None Available. FINDINGS: Gallbladder: Small amount of gallbladder sludge. No shadowing stones. Normal wall thickness. Negative sonographic Murphy. Gallbladder is distended. Common bile duct: Diameter: 2.6 mm Liver: No focal lesion identified. Within normal limits in parenchymal echogenicity. Portal vein is patent on color Doppler imaging with normal direction of blood flow towards the liver. Other: None. IMPRESSION: Distended gallbladder with small amount of sludge. No shadowing stones. Negative for biliary dilatation. Electronically Signed   By: Donavan Foil M.D.   On: 10/24/2022 17:52   CT Angio Chest/Abd/Pel for Dissection W and/or Wo Contrast  Result Date: 10/24/2022 CLINICAL DATA:  Acute aortic syndrome suspected EXAM: CT ANGIOGRAPHY CHEST, ABDOMEN AND PELVIS TECHNIQUE: Non-contrast CT of the chest was initially obtained. Multidetector CT imaging through the chest, abdomen and pelvis was performed using the standard protocol during bolus administration of intravenous contrast. Multiplanar reconstructed images and MIPs were obtained and reviewed to evaluate the vascular anatomy. RADIATION DOSE REDUCTION: This exam was performed according to the departmental dose-optimization program which includes automated exposure control, adjustment of the mA and/or kV according to patient size and/or use of iterative reconstruction technique. CONTRAST:  140m OMNIPAQUE IOHEXOL 350 MG/ML SOLN COMPARISON:  CT chest angiogram, 09/12/2020 FINDINGS: CTA CHEST FINDINGS VASCULAR Aorta: Satisfactory opacification of the aorta. Normal contour and caliber of the thoracic aorta. No evidence of aneurysm, dissection, or other acute aortic pathology. Moderate mixed calcific atherosclerosis. Cardiovascular: No evidence of  pulmonary embolism on limited non-tailored examination. Normal heart size. Three-vessel coronary artery calcifications. No pericardial effusion. Review of the MIP images confirms the above findings. NON VASCULAR Mediastinum/Nodes: No enlarged mediastinal, hilar, or axillary lymph nodes. Thyroid gland, trachea, and esophagus demonstrate no significant findings. Lungs/Pleura: Extensive dependent bibasilar scarring and bronchiectasis (series 4, image 92). No pleural effusion or pneumothorax. Musculoskeletal: No chest wall abnormality. No acute osseous findings. Review of the MIP images confirms the above findings. CTA ABDOMEN AND PELVIS FINDINGS VASCULAR Normal contour and caliber of the abdominal aorta. No evidence of aneurysm, dissection, or other acute aortic pathology. Standard branching pattern of the abdominal aorta with solitary bilateral renal arteries. Review of the MIP images confirms the above findings. NON-VASCULAR Hepatobiliary: No solid liver abnormality is seen. Mildly distended gallbladder. No gallstones, gallbladder wall thickening, or biliary dilatation. Pancreas: Unremarkable. No pancreatic ductal dilatation or surrounding inflammatory changes. Spleen: Normal in size without significant abnormality. Adrenals/Urinary Tract: Adrenal glands are unremarkable. Kidneys are normal, without renal calculi, solid lesion, or hydronephrosis. Bladder is unremarkable. Stomach/Bowel: Stomach is within normal limits. Appendix is not clearly visualized. No evidence of bowel wall thickening, distention, or inflammatory changes. Sigmoid diverticulosis. Lymphatic: No enlarged abdominal or pelvic lymph nodes. Reproductive: Status post prostatectomy. Other: No abdominal wall hernia or abnormality. No ascites. Musculoskeletal: No acute osseous findings. IMPRESSION: 1. Normal contour and caliber of the thoracic and abdominal aorta. No evidence of aneurysm, dissection, or other acute aortic pathology. Moderate mixed calcific  atherosclerosis.  2. 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 3. Coronary artery disease. 4. Sigmoid diverticulosis without evidence of acute diverticulitis. 5. Status post prostatectomy. Aortic Atherosclerosis (ICD10-I70.0). Electronically Signed   By: Delanna Ahmadi M.D.   On: 10/24/2022 17:18   DG Chest Portable 1 View  Result Date: 10/24/2022 CLINICAL DATA:  Abdominal distension.  Evaluate for free air. EXAM: PORTABLE CHEST 1 VIEW COMPARISON:  Chest radiographs 12/10/2019 and 09/15/2020. Chest CT 09/12/2020. FINDINGS: 1457 hours. AP semi erect view demonstrates cardiomegaly, probable pulmonary edema and small bilateral pleural effusions. There is no confluent airspace opacity or pneumothorax. No evidence of pneumoperitoneum on this view. Aortic atherosclerosis noted. There are degenerative changes within the spine without evidence of acute osseous abnormality. Numerous telemetry leads overlie the chest and upper abdomen. IMPRESSION: 1. No evidence of pneumoperitoneum. 2. Cardiomegaly with probable pulmonary edema and small bilateral pleural effusions suspicious for recurrent congestive heart failure. Electronically Signed   By: Richardean Sale M.D.   On: 10/24/2022 15:38    Anti-infectives: Anti-infectives (From admission, onward)    Start     Dose/Rate Route Frequency Ordered Stop   10/25/22 0200  piperacillin-tazobactam (ZOSYN) IVPB 3.375 g  Status:  Discontinued        3.375 g 12.5 mL/hr over 240 Minutes Intravenous Every 8 hours 10/24/22 2025 10/25/22 1620   10/24/22 1700  piperacillin-tazobactam (ZOSYN) IVPB 3.375 g        3.375 g 12.5 mL/hr over 240 Minutes Intravenous  Once 10/24/22 1653 10/24/22 1721       Assessment/Plan: Acute calculus cholecystitis s/p laparoscopic cholecystectomy 10/25/22 Dr. Thermon Leyland Elevated liver functions tests - AST/ ALT/ Alk phos improving after surgery.  Total  bilirubin remains elevated at 4.1.  Negative MRCP, no IOC performed.  Clinically he seems to be improved, but would keep in the hospital to recheck liver function tests and to await return of some bowel function   LOS: 2 days    Maia Petties 10/26/2022

## 2022-10-27 DIAGNOSIS — R6520 Severe sepsis without septic shock: Secondary | ICD-10-CM | POA: Diagnosis not present

## 2022-10-27 DIAGNOSIS — K810 Acute cholecystitis: Secondary | ICD-10-CM | POA: Diagnosis not present

## 2022-10-27 DIAGNOSIS — A419 Sepsis, unspecified organism: Secondary | ICD-10-CM | POA: Diagnosis not present

## 2022-10-27 DIAGNOSIS — J9601 Acute respiratory failure with hypoxia: Secondary | ICD-10-CM | POA: Diagnosis not present

## 2022-10-27 LAB — COMPREHENSIVE METABOLIC PANEL
ALT: 360 U/L — ABNORMAL HIGH (ref 0–44)
AST: 229 U/L — ABNORMAL HIGH (ref 15–41)
Albumin: 3.5 g/dL (ref 3.5–5.0)
Alkaline Phosphatase: 161 U/L — ABNORMAL HIGH (ref 38–126)
Anion gap: 7 (ref 5–15)
BUN: 28 mg/dL — ABNORMAL HIGH (ref 8–23)
CO2: 24 mmol/L (ref 22–32)
Calcium: 8.6 mg/dL — ABNORMAL LOW (ref 8.9–10.3)
Chloride: 107 mmol/L (ref 98–111)
Creatinine, Ser: 1.5 mg/dL — ABNORMAL HIGH (ref 0.61–1.24)
GFR, Estimated: 46 mL/min — ABNORMAL LOW (ref >60)
Glucose, Bld: 130 mg/dL — ABNORMAL HIGH (ref 70–99)
Potassium: 4.3 mmol/L (ref 3.5–5.1)
Sodium: 138 mmol/L (ref 135–145)
Total Bilirubin: 1.6 mg/dL — ABNORMAL HIGH (ref 0.3–1.2)
Total Protein: 6 g/dL — ABNORMAL LOW (ref 6.5–8.1)

## 2022-10-27 LAB — CBC WITH DIFFERENTIAL/PLATELET
Abs Immature Granulocytes: 0.08 10*3/uL — ABNORMAL HIGH (ref 0.00–0.07)
Basophils Absolute: 0 10*3/uL (ref 0.0–0.1)
Basophils Relative: 0 %
Eosinophils Absolute: 0 10*3/uL (ref 0.0–0.5)
Eosinophils Relative: 0 %
HCT: 36.6 % — ABNORMAL LOW (ref 39.0–52.0)
Hemoglobin: 12.2 g/dL — ABNORMAL LOW (ref 13.0–17.0)
Immature Granulocytes: 1 %
Lymphocytes Relative: 7 %
Lymphs Abs: 1.1 10*3/uL (ref 0.7–4.0)
MCH: 32.1 pg (ref 26.0–34.0)
MCHC: 33.3 g/dL (ref 30.0–36.0)
MCV: 96.3 fL (ref 80.0–100.0)
Monocytes Absolute: 1 10*3/uL (ref 0.1–1.0)
Monocytes Relative: 6 %
Neutro Abs: 13.5 10*3/uL — ABNORMAL HIGH (ref 1.7–7.7)
Neutrophils Relative %: 86 %
Platelets: 130 10*3/uL — ABNORMAL LOW (ref 150–400)
RBC: 3.8 MIL/uL — ABNORMAL LOW (ref 4.22–5.81)
RDW: 13 % (ref 11.5–15.5)
WBC: 15.7 10*3/uL — ABNORMAL HIGH (ref 4.0–10.5)
nRBC: 0 % (ref 0.0–0.2)

## 2022-10-27 LAB — MAGNESIUM: Magnesium: 2.1 mg/dL (ref 1.7–2.4)

## 2022-10-27 NOTE — Progress Notes (Signed)
2 Days Post-Op   Subjective/Chief Complaint: Patient is ambulating independently Flatus, no BM yet No nausea Urine is clearing up LFT's much improved   Objective: Vital signs in last 24 hours: Temp:  [97.5 F (36.4 C)-98.2 F (36.8 C)] 97.7 F (36.5 C) (12/10 0451) Pulse Rate:  [75-93] 75 (12/10 0546) Resp:  [16-18] 16 (12/10 0451) BP: (142-170)/(74-87) 157/81 (12/10 0546) SpO2:  [95 %-97 %] 95 % (12/10 0451) Weight:  [66.2 kg] 66.2 kg (12/09 1700) Last BM Date : 10/26/22  Intake/Output from previous day: 12/09 0701 - 12/10 0700 In: 963 [P.O.:960; I.V.:3] Out: -  Intake/Output this shift: No intake/output data recorded.  WDWN in NAD No clinical jaundice Abd - soft, incisional tenderness Incisions c/d/i  Lab Results:  Recent Labs    10/26/22 0557 10/27/22 0506  WBC 17.9* 15.7*  HGB 12.6* 12.2*  HCT 38.3* 36.6*  PLT 125* 130*   BMET Recent Labs    10/26/22 0557 10/27/22 0506  NA 137 138  K 4.1 4.3  CL 107 107  CO2 21* 24  GLUCOSE 168* 130*  BUN 27* 28*  CREATININE 1.75* 1.50*  CALCIUM 8.7* 8.6*      Latest Ref Rng & Units 10/27/2022    5:06 AM 10/26/2022    5:57 AM 10/25/2022    5:40 AM  Hepatic Function  Total Protein 6.5 - 8.1 g/dL 6.0  6.3  6.1   Albumin 3.5 - 5.0 g/dL 3.5  3.3  3.2   AST 15 - 41 U/L 229  362  743   ALT 0 - 44 U/L 360  470  794   Alk Phosphatase 38 - 126 U/L 161  182  189   Total Bilirubin 0.3 - 1.2 mg/dL 1.6  4.1  4.0       Studies/Results: No results found.  Anti-infectives: Anti-infectives (From admission, onward)    Start     Dose/Rate Route Frequency Ordered Stop   10/25/22 0200  piperacillin-tazobactam (ZOSYN) IVPB 3.375 g  Status:  Discontinued        3.375 g 12.5 mL/hr over 240 Minutes Intravenous Every 8 hours 10/24/22 2025 10/25/22 1620   10/24/22 1700  piperacillin-tazobactam (ZOSYN) IVPB 3.375 g        3.375 g 12.5 mL/hr over 240 Minutes Intravenous  Once 10/24/22 1653 10/24/22 1721        Assessment/Plan: Acute calculus cholecystitis s/p laparoscopic cholecystectomy 10/25/22 Dr. Thermon Leyland Elevated liver functions tests - AST/ ALT/ Alk phos improving after surgery.  Total bilirubin decreased to 1.6.  Negative MRCP, no IOC performed.   Clinically he seems to be improved LFT's normalizing  Ready for discharge  LOS: 3 days    Maia Petties 10/27/2022

## 2022-10-27 NOTE — Progress Notes (Signed)
[facility] Progress Note  SUBJECTIVE:   Interval history: Larry Olsen was seen and evaluated today at bedside.  Resting comfortably in bed, no nausea or vomiting.  No chest pain.  Has chronic shortness of breath.  Has had no bowel movement since surgery, has had flatus.  Feels somewhat bloated, though no significant abdominal pain.  Past Medical History:  Diagnosis Date   HTN (hypertension)    Past Surgical History:  Procedure Laterality Date   PROSTATE SURGERY     Current Facility-Administered Medications  Medication Dose Route Frequency Provider Last Rate Last Admin   0.9 %  sodium chloride infusion  250 mL Intravenous PRN Karie Soda, MD       acetaminophen (TYLENOL) tablet 650 mg  650 mg Oral Q6H PRN Eric Form, PA-C   650 mg at 10/25/22 2231   alum & mag hydroxide-simeth (MAALOX/MYLANTA) 200-200-20 MG/5ML suspension 30 mL  30 mL Oral Q6H PRN Karie Soda, MD       bisacodyl (DULCOLAX) suppository 10 mg  10 mg Rectal Q12H PRN Karie Soda, MD       docusate sodium (COLACE) capsule 100 mg  100 mg Oral BID Eric Form, PA-C   100 mg at 10/26/22 2114   heparin injection 5,000 Units  5,000 Units Subcutaneous Q8H Eric Form, PA-C   5,000 Units at 10/27/22 1194   HYDROmorphone (DILAUDID) injection 1 mg  1 mg Intravenous Q3H PRN Eric Form, PA-C       lactated ringers bolus 1,000 mL  1,000 mL Intravenous Q8H PRN Karie Soda, MD       lip balm (CARMEX) ointment   Topical BID Karie Soda, MD   75 Application at 10/26/22 2114   magic mouthwash  15 mL Oral QID PRN Karie Soda, MD       menthol-cetylpyridinium (CEPACOL) lozenge 3 mg  1 lozenge Oral PRN Karie Soda, MD       methocarbamol (ROBAXIN) 1,000 mg in dextrose 5 % 100 mL IVPB  1,000 mg Intravenous Q6H PRN Karie Soda, MD       methocarbamol (ROBAXIN) tablet 1,000 mg  1,000 mg Oral Q6H PRN Karie Soda, MD       naphazoline-glycerin (CLEAR EYES REDNESS) ophth solution 1-2 drop  1-2  drop Both Eyes QID PRN Karie Soda, MD       ondansetron [facility]) injection 4 mg  4 mg Intravenous Q6H PRN Karie Soda, MD       Or   ondansetron (ZOFRAN) 8 mg in sodium chloride 0.9 % 50 mL IVPB  8 mg Intravenous Q6H PRN Karie Soda, MD       oxyCODONE (Oxy IR/ROXICODONE) immediate release tablet 2.5-5 mg  2.5-5 mg Oral Q4H PRN Eric Form, PA-C       phenol (CHLORASEPTIC) mouth spray 2 spray  2 spray Mouth/Throat PRN Karie Soda, MD       polycarbophil (FIBERCON) tablet 625 mg  625 mg Oral BID Karie Soda, MD   625 mg at 10/26/22 2114   prochlorperazine (COMPAZINE) injection 5-10 mg  5-10 mg Intravenous Q4H PRN Karie Soda, MD       sodium chloride (OCEAN) 0.65 % nasal spray 1-2 spray  1-2 spray Each Nare Q6H PRN Karie Soda, MD       sodium chloride flush (NS) 0.9 % injection 3 mL  3 mL Intravenous Q12H Eric Form, PA-C   3 mL at 10/25/22 2233   sodium chloride flush (NS) 0.9 %  injection 3 mL  3 mL Intravenous Catha Gosselin, MD   3 mL at 10/26/22 2113   sodium chloride flush (NS) 0.9 % injection 3 mL  3 mL Intravenous PRN Karie Soda, MD       Allergies as of 10/24/2022   (No Known Allergies)   Review of Systems:  Review of Systems  Respiratory:  Positive for shortness of breath (Chronic).   Cardiovascular:  Negative for chest pain.  Gastrointestinal:  Negative for abdominal pain, nausea and vomiting.       Abdominal distention.  No bowel movement since surgery.    OBJECTIVE:   Temp:  [97.5 F (36.4 C)-98.2 F (36.8 C)] 97.7 F (36.5 C) (12/10 0451) Pulse Rate:  [75-93] 75 (12/10 0546) Resp:  [16-18] 16 (12/10 0451) BP: (142-170)/(74-87) 157/81 (12/10 0546) SpO2:  [95 %-97 %] 95 % (12/10 0451) Weight:  [66.2 kg] 66.2 kg (12/09 1700) Last BM Date : 10/26/22 Physical Exam Constitutional:      General: He is not in acute distress.    Appearance: He is not ill-appearing, toxic-appearing or diaphoretic.  Cardiovascular:     Rate and Rhythm:  Normal rate and regular rhythm.  Pulmonary:     Effort: No respiratory distress.     Breath sounds: Normal breath sounds.  Abdominal:     General: Bowel sounds are normal. There is distension.     Palpations: Abdomen is soft.     Tenderness: There is no abdominal tenderness.  Musculoskeletal:     Right lower leg: No edema.     Left lower leg: No edema.  Skin:    General: Skin is warm and dry.     Comments: Midline abdominal incision x 3 appear clean, dry and intact.  Right upper quadrant incision appears clean dry and intact.  Neurological:     Mental Status: He is alert.     Labs: Recent Labs    10/25/22 0540 10/25/22 1310 10/26/22 0557 10/27/22 0506  WBC 19.3*  --  17.9* 15.7*  HGB 13.4 12.2* 12.6* 12.2*  HCT 40.7 36.0* 38.3* 36.6*  PLT 125*  --  125* 130*   BMET Recent Labs    10/25/22 0540 10/25/22 1310 10/26/22 0557 10/27/22 0506  NA 135 138 137 138  K 5.3* 4.4 4.1 4.3  CL 103 105 107 107  CO2 23  --  21* 24  GLUCOSE 120* 109* 168* 130*  BUN 25* 22 27* 28*  CREATININE 1.63* 1.70* 1.75* 1.50*  CALCIUM 8.4*  --  8.7* 8.6*   LFT Recent Labs    10/27/22 0506  PROT 6.0*  ALBUMIN 3.5  AST 229*  ALT 360*  ALKPHOS 161*  BILITOT 1.6*   PT/INR No results for input(s): "LABPROT", "INR" in the last 72 hours. Diagnostic imaging: No results found.  IMPRESSION: Cholecystitis status post laparoscopic cholecystectomy 10/25/22             -No intraoperative cholangiogram completed per chart review Transaminase elevation in mixed pattern, improved             -Suspect multifactorial in setting of hypotension on presentation as well as cholecystitis Hyperbilirubinemia, improved             -MRCP 10/24/22 with no choledocholithiasis Abdominal pain, improved History chronic kidney disease History prostate cancer  PLAN: -Liver enzyme trend has improved including serum bilirubin -No plans for endoscopy -Your postsurgical management -Recommend liver enzyme panel  through PCP within next 1 week -Diet as tolerated -  Stable for discharge from GI standpoint -Gastroenterology team will respectfully sign off and be available for any questions that arise   LOS: 3 days   Liliane Shi, [facility]

## 2022-10-27 NOTE — TOC Transition Note (Signed)
Transition of Care [facility]) - CM/SW Discharge Note   Patient Details  Name: Larry Olsen MRN: [id number] Date of Birth: November 11, 1941  Transition of Care [facility]) CM/SW Contact:  Lanier Clam, RN Phone Number: 10/27/2022, 10:23 AM   Clinical Narrative:  d/c home No CM needs.     Final next level of care: Home/Self Care Barriers to Discharge: No Barriers Identified   Patient Goals and CMS Choice Patient states their goals for this hospitalization and ongoing recovery are:: to return home CMS Medicare.gov Compare Post Acute Care list provided to:: Patient Choice offered to / list presented to : Patient  Discharge Placement                       Discharge Plan and Services   Discharge Planning Services: CM Consult                                 Social Determinants of Health (SDOH) Interventions     Readmission Risk Interventions     No data to display

## 2022-10-27 NOTE — Discharge Summary (Signed)
Physician Discharge Summary   Larry Olsen CMK:[id number] DOB: 1941/08/28 DOA: 10/24/2022  PCP: Lawerance Cruel, MD  Admit date: 10/24/2022 Discharge date: 10/27/2022  Barriers to discharge: none  Admitted From: Home Disposition:  Home Discharging physician: Dwyane Dee, MD  Recommendations for Outpatient Follow-up:  Repeat LFTs  Home Health:  Equipment/Devices:   Discharge Condition: stable CODE STATUS: Full Diet recommendation:  Diet Orders (From admission, onward)     Start     Ordered   10/27/22 0000  Diet general        10/27/22 0940   10/26/22 1757  DIET SOFT Room service appropriate? Yes; Fluid consistency: Thin  Diet effective now       Question Answer Comment  Room service appropriate? Yes   Fluid consistency: Thin      10/26/22 1756            Hospital Course: Larry Olsen is a 81 y.o. male with medical history significant for HTN, HLD, CKD stage IIIa, prostate cancer s/p prostatectomy who is admitted with sepsis and elevated LFTs.  He underwent multiple imaging studies on admission.  Ultimately there was concern for acute cholecystitis with a distended gallbladder noted and trace pericholecystic fluid found on MRCP.  No choledocholithiasis was present. General surgery was consulted and patient was recommended for cholecystectomy.  Assessment and Plan: * Severe sepsis (HCC)-resolved as of 10/26/2022 Presenting with leukocytosis, tachypnea, elevated LFTs, lactic acidosis with presumed intra-abdominal infectious source. -Zosyn discontinued per surgery  Acute cholecystitis - MRCP shows distended gallbladder with small layering stones.  Trace pericholecystic fluid with no ductal dilatation nor choledocholithiasis. -Evaluated by general surgery and underwent lap cholecystectomy on 10/25/2022 -LFTs elevated on admission and now downtrending after cholecystectomy  Acute respiratory failure with hypoxia (HCC)-resolved as of 10/26/2022 - Small bilateral  pleural effusions noted on CXR.  Also some concern in the past for patient having some possible aspiration -Remains nontoxic-appearing -Weaned to room air - Okay to discontinue antibiotics from a respiratory standpoint as well  Chronic kidney disease, stage 3a ([place]) - patient has history of CKD3a. Baseline creat ~ 1.5-1.6, eGFR 48   Hypocalcemia - Repleted  Hypokalemia - Repleted  Hyperlipidemia - Statin held during hospitalization due to elevated LFTs  Essential hypertension - Resume Toprol at discharge       The patient's chronic medical conditions were treated accordingly per the patient's home medication regimen except as noted.  On day of discharge, patient was felt deemed stable for discharge. Patient/family member advised to call PCP or come back to ER if needed.   Principal Diagnosis: Severe sepsis [facility])  Discharge Diagnoses: Active Hospital Problems   Diagnosis Date Noted   Acute cholecystitis 10/24/2022    Priority: 2.   Chronic kidney disease, stage 3a ([place]) 10/24/2022    Priority: 3.   LFT elevation 10/26/2022   Essential hypertension 10/24/2022   Hyperlipidemia 10/24/2022   Hypokalemia 10/24/2022   Hypocalcemia 10/24/2022    Resolved Hospital Problems   Diagnosis Date Noted Date Resolved   Severe sepsis [facility]) 10/24/2022 10/26/2022    Priority: 1.   Acute respiratory failure with hypoxia [facility]) 10/24/2022 10/26/2022    Priority: 2.     Discharge Instructions     Diet general   Complete by: As directed    Increase activity slowly   Complete by: As directed    No wound care   Complete by: As directed       Allergies as of 10/27/2022   No  Known Allergies      Medication List     TAKE these medications    aspirin 81 MG chewable tablet Chew 1 tablet (81 mg total) by mouth daily. What changed: when to take this   atorvastatin 80 MG tablet Commonly known as: LIPITOR Take 80 mg by mouth daily with lunch.   CENTRUM SILVER 50+MEN  PO Take 1 tablet by mouth daily with breakfast.   metoprolol succinate 25 MG 24 hr tablet Commonly known as: TOPROL-XL Take 1 tablet (25 mg total) by mouth daily. Take with or immediately following a meal. What changed: when to take this   vitamin D3 50 MCG (2000 UT) Caps Take 2,000 Units by mouth daily with lunch.        Follow-up [facility], [place]. Go on 11/22/2022.   Specialty: General Surgery Why: 11/22/22 at 9 am., Please arrive 30 minutes early to complete check in, and bring photo ID and insurance card. Contact information: [address] [place] [phone]               No Known Allergies  Consultations: GI General surgery  Procedures: 10/26/22: Lap CCY  Discharge Exam: BP (!) 157/81   Pulse 75   Temp 97.7 F (36.5 C) (Oral)   Resp 16   Ht _0  (1.651 m)   Wt 66.2 kg   SpO2 95%   BMI 24.30 kg/m  Physical Exam Constitutional:      General: He is not in acute distress.    Appearance: Normal appearance.  HENT:     Head: Normocephalic and atraumatic.     Mouth/Throat:     Mouth: Mucous membranes are moist.  Eyes:     Extraocular Movements: Extraocular movements intact.  Cardiovascular:     Rate and Rhythm: Normal rate and regular rhythm.     Heart sounds: Normal heart sounds.  Pulmonary:     Effort: Pulmonary effort is normal. No respiratory distress.     Breath sounds: No wheezing or rhonchi.  Abdominal:     General: Bowel sounds are normal. There is no distension.     Palpations: Abdomen is soft.     Comments: Surgical incisions noted with surgical glue in place and no surrounding signs of infection  Musculoskeletal:        General: Normal range of motion.     Cervical back: Normal range of motion and neck supple.  Skin:    General: Skin is warm and dry.  Neurological:     General: No focal deficit present.     Mental Status: He is alert.  Psychiatric:         Mood and Affect: Mood normal.        Behavior: Behavior normal.      The results of significant diagnostics from this hospitalization (including imaging, microbiology, ancillary and laboratory) are listed below for reference.   Microbiology: No results found for this or any previous visit (from the past 240 hour(s)).   Labs: BNP (last 3 results) No results for input(s): "BNP" in the last 8760 hours. Basic Metabolic Panel: Recent Labs  Lab 10/24/22 1538 10/24/22 1549 10/25/22 0540 10/25/22 1310 10/26/22 0557 10/27/22 0506  NA 139 138 135 138 137 138  K 3.3* 4.2 5.3* 4.4 4.1 4.3  CL 112* 105 103 105 107 107  CO2 18*  --  23  --  21* 24  GLUCOSE 193* 217* 120*  109* 168* 130*  BUN 27* 28* 25* 22 27* 28*  CREATININE 1.32* 1.40* 1.63* 1.70* 1.75* 1.50*  CALCIUM 7.4*  --  8.4*  --  8.7* 8.6*  MG  --   --   --   --  1.9 2.1   Liver Function Tests: Recent Labs  Lab 10/24/22 1538 10/25/22 0540 10/26/22 0557 10/27/22 0506  AST 606* 743* 362* 229*  ALT 400* 794* 470* 360*  ALKPHOS 144* 189* 182* 161*  BILITOT 1.8* 4.0* 4.1* 1.6*  PROT 5.7* 6.1* 6.3* 6.0*  ALBUMIN 3.4* 3.2* 3.3* 3.5   Recent Labs  Lab 10/24/22 1538  LIPASE 32   No results for input(s): "AMMONIA" in the last 168 hours. CBC: Recent Labs  Lab 10/24/22 1538 10/24/22 1549 10/25/22 0540 10/25/22 1310 10/26/22 0557 10/27/22 0506  WBC 19.1*  --  19.3*  --  17.9* 15.7*  NEUTROABS 17.4*  --   --   --  16.4* 13.5*  HGB 14.7 13.6 13.4 12.2* 12.6* 12.2*  HCT 44.0 40.0 40.7 36.0* 38.3* 36.6*  MCV 96.9  --  97.6  --  98.0 96.3  PLT 176  --  125*  --  125* 130*   Cardiac Enzymes: No results for input(s): "CKTOTAL", "CKMB", "CKMBINDEX", "TROPONINI" in the last 168 hours. BNP: Invalid input(s): "POCBNP" CBG: No results for input(s): "GLUCAP" in the last 168 hours. D-Dimer No results for input(s): "DDIMER" in the last 72 hours. Hgb A1c No results for input(s): "HGBA1C" in the last 72 hours. Lipid  Profile No results for input(s): "CHOL", "HDL", "LDLCALC", "TRIG", "CHOLHDL", "LDLDIRECT" in the last 72 hours. Thyroid function studies No results for input(s): "TSH", "T4TOTAL", "T3FREE", "THYROIDAB" in the last 72 hours.  Invalid input(s): "FREET3" Anemia work up No results for input(s): "VITAMINB12", "FOLATE", "FERRITIN", "TIBC", "IRON", "RETICCTPCT" in the last 72 hours. Urinalysis    Component Value Date/Time   COLORURINE YELLOW 10/24/2022 1646   APPEARANCEUR CLEAR 10/24/2022 1646   LABSPEC 1.010 10/24/2022 1646   PHURINE 6.0 10/24/2022 1646   GLUCOSEU >=500 (A) 10/24/2022 1646   HGBUR NEGATIVE 10/24/2022 1646   BILIRUBINUR NEGATIVE 10/24/2022 1646   KETONESUR NEGATIVE 10/24/2022 1646   PROTEINUR NEGATIVE 10/24/2022 1646   NITRITE NEGATIVE 10/24/2022 1646   LEUKOCYTESUR NEGATIVE 10/24/2022 1646   Sepsis Labs Recent Labs  Lab 10/24/22 1538 10/25/22 0540 10/26/22 0557 10/27/22 0506  WBC 19.1* 19.3* 17.9* 15.7*   Microbiology No results found for this or any previous visit (from the past 240 hour(s)).  Procedures/Studies: MR ABDOMEN MRCP W WO CONTAST  Result Date: 10/25/2022 CLINICAL DATA:  Right upper quadrant abdominal pain. Biliary disease suspected. EXAM: MRI ABDOMEN WITHOUT AND WITH CONTRAST (INCLUDING MRCP) TECHNIQUE: Multiplanar multisequence MR imaging of the abdomen was performed both before and after the administration of intravenous contrast. Heavily T2-weighted images of the biliary and pancreatic ducts were obtained, and three-dimensional MRCP images were rendered by post processing. CONTRAST:  73m GADAVIST GADOBUTROL 1 MMOL/ML IV SOLN COMPARISON:  CTA chest abdomen and pelvis earlier same day FINDINGS: Lower chest: The bibasilar scarring and bronchiectasis described on CT scan earlier today is not well demonstrated by MRI. Hepatobiliary: No suspicious focal abnormality within the liver parenchyma. Gallbladder is distended with tiny layering gallstones evident  (see axial T2 haste image 18 of series 4). Trace pericholecystic fluid evident. No intra or extrahepatic biliary duct dilatation MRCP imaging is degraded by motion artifact but within this limitation, no choledocholithiasis evident. Pancreas: No focal mass lesion. No dilatation of  the main duct. No intraparenchymal cyst. No peripancreatic edema. Spleen:  No splenomegaly. No focal mass lesion. Adrenals/Urinary Tract: No adrenal nodule or mass. Kidneys unremarkable. Stomach/Bowel: Stomach is nondistended. There is trace fluid around the descending duodenum No small bowel or colonic dilatation within the visualized abdomen. Vascular/Lymphatic: No abdominal aortic aneurysm. No abdominal lymphadenopathy. Other:  No substantial intraperitoneal free fluid. Musculoskeletal: No focal suspicious marrow enhancement within the visualized bony anatomy. IMPRESSION: 1. Distended gallbladder with tiny layering gallstones. Trace pericholecystic fluid evident. No intra or extrahepatic biliary duct dilatation. No choledocholithiasis evident. Acute cholecystitis would be a distinct consideration. If clinical picture is equivocal, nuclear scintigraphy may prove helpful to assess for cystic duct obstruction. 2. Trace fluid around the descending duodenum, likely related to gallbladder findings. 3. Bibasilar scarring and bronchiectasis described on CT scan earlier today is not well demonstrated by MRI. Electronically Signed   By: Misty Stanley M.D.   On: 10/25/2022 05:23   US Abdomen Limited RUQ (LIVER/GB)  Result Date: 10/24/2022 CLINICAL DATA:  Distended gallbladder on CT EXAM: ULTRASOUND ABDOMEN LIMITED RIGHT UPPER QUADRANT COMPARISON:  None Available. FINDINGS: Gallbladder: Small amount of gallbladder sludge. No shadowing stones. Normal wall thickness. Negative sonographic Murphy. Gallbladder is distended. Common bile duct: Diameter: 2.6 mm Liver: No focal lesion identified. Within normal limits in parenchymal echogenicity. Portal  vein is patent on color Doppler imaging with normal direction of blood flow towards the liver. Other: None. IMPRESSION: Distended gallbladder with small amount of sludge. No shadowing stones. Negative for biliary dilatation. Electronically Signed   By: Donavan Foil M.D.   On: 10/24/2022 17:52   CT Angio Chest/Abd/Pel for Dissection W and/or Wo Contrast  Result Date: 10/24/2022 CLINICAL DATA:  Acute aortic syndrome suspected EXAM: CT ANGIOGRAPHY CHEST, ABDOMEN AND PELVIS TECHNIQUE: Non-contrast CT of the chest was initially obtained. Multidetector CT imaging through the chest, abdomen and pelvis was performed using the standard protocol during bolus administration of intravenous contrast. Multiplanar reconstructed images and MIPs were obtained and reviewed to evaluate the vascular anatomy. RADIATION DOSE REDUCTION: This exam was performed according to the departmental dose-optimization program which includes automated exposure control, adjustment of the mA and/or kV according to patient size and/or use of iterative reconstruction technique. CONTRAST:  157m OMNIPAQUE IOHEXOL 350 MG/ML SOLN COMPARISON:  CT chest angiogram, 09/12/2020 FINDINGS: CTA CHEST FINDINGS VASCULAR Aorta: Satisfactory opacification of the aorta. Normal contour and caliber of the thoracic aorta. No evidence of aneurysm, dissection, or other acute aortic pathology. Moderate mixed calcific atherosclerosis. Cardiovascular: No evidence of pulmonary embolism on limited non-tailored examination. Normal heart size. Three-vessel coronary artery calcifications. No pericardial effusion. Review of the MIP images confirms the above findings. NON VASCULAR Mediastinum/Nodes: No enlarged mediastinal, hilar, or axillary lymph nodes. Thyroid gland, trachea, and esophagus demonstrate no significant findings. Lungs/Pleura: Extensive dependent bibasilar scarring and bronchiectasis (series 4, image 92). No pleural effusion or pneumothorax. Musculoskeletal: No  chest wall abnormality. No acute osseous findings. Review of the MIP images confirms the above findings. CTA ABDOMEN AND PELVIS FINDINGS VASCULAR Normal contour and caliber of the abdominal aorta. No evidence of aneurysm, dissection, or other acute aortic pathology. Standard branching pattern of the abdominal aorta with solitary bilateral renal arteries. Review of the MIP images confirms the above findings. NON-VASCULAR Hepatobiliary: No solid liver abnormality is seen. Mildly distended gallbladder. No gallstones, gallbladder wall thickening, or biliary dilatation. Pancreas: Unremarkable. No pancreatic ductal dilatation or surrounding inflammatory changes. Spleen: Normal in size without significant abnormality. Adrenals/Urinary Tract: Adrenal glands are  unremarkable. Kidneys are normal, without renal calculi, solid lesion, or hydronephrosis. Bladder is unremarkable. Stomach/Bowel: Stomach is within normal limits. Appendix is not clearly visualized. No evidence of bowel wall thickening, distention, or inflammatory changes. Sigmoid diverticulosis. Lymphatic: No enlarged abdominal or pelvic lymph nodes. Reproductive: Status post prostatectomy. Other: No abdominal wall hernia or abnormality. No ascites. Musculoskeletal: No acute osseous findings. IMPRESSION: 1. Normal contour and caliber of the thoracic and abdominal aorta. No evidence of aneurysm, dissection, or other acute aortic pathology. Moderate mixed calcific atherosclerosis. 2. 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 3. Coronary artery disease. 4. Sigmoid diverticulosis without evidence of acute diverticulitis. 5. Status post prostatectomy. Aortic Atherosclerosis (ICD10-I70.0). Electronically Signed   By: Delanna Ahmadi M.D.   On: 10/24/2022 17:18   DG Chest Portable 1 View  Result Date: 10/24/2022 CLINICAL DATA:  Abdominal distension.  Evaluate for free air. EXAM:  PORTABLE CHEST 1 VIEW COMPARISON:  Chest radiographs 12/10/2019 and 09/15/2020. Chest CT 09/12/2020. FINDINGS: 1457 hours. AP semi erect view demonstrates cardiomegaly, probable pulmonary edema and small bilateral pleural effusions. There is no confluent airspace opacity or pneumothorax. No evidence of pneumoperitoneum on this view. Aortic atherosclerosis noted. There are degenerative changes within the spine without evidence of acute osseous abnormality. Numerous telemetry leads overlie the chest and upper abdomen. IMPRESSION: 1. No evidence of pneumoperitoneum. 2. Cardiomegaly with probable pulmonary edema and small bilateral pleural effusions suspicious for recurrent congestive heart failure. Electronically Signed   By: Richardean Sale M.D.   On: 10/24/2022 15:38     Time coordinating discharge: Over 30 minutes    Dwyane Dee, MD  [facility] 10/27/2022, 12:10 PM

## 2022-10-27 NOTE — Progress Notes (Signed)
No changes to initial am assessment. Pt condition stable. Verbalized understanding of discharge instructions given. Wife asked questions in regards to sutures. Pt stated that the provider told him that sutures will dissolve

## 2022-10-28 ENCOUNTER — Other Ambulatory Visit (HOSPITAL_COMMUNITY): Payer: Self-pay | Admitting: Student

## 2022-10-28 ENCOUNTER — Other Ambulatory Visit: Payer: Self-pay

## 2022-10-28 ENCOUNTER — Encounter (HOSPITAL_COMMUNITY): Payer: Self-pay | Admitting: Surgery

## 2022-10-28 DIAGNOSIS — R933 Abnormal findings on diagnostic imaging of other parts of digestive tract: Secondary | ICD-10-CM

## 2022-10-28 DIAGNOSIS — R1084 Generalized abdominal pain: Secondary | ICD-10-CM

## 2022-10-28 LAB — SURGICAL PATHOLOGY

## 2022-10-28 NOTE — Anesthesia Postprocedure Evaluation (Signed)
Anesthesia Post Note  Patient: Larry Olsen  Procedure(s) Performed: LAPAROSCOPIC CHOLECYSTECTOMy (Abdomen)     Patient location during evaluation: PACU Anesthesia Type: General Level of consciousness: awake and alert Pain management: pain level controlled Vital Signs Assessment: post-procedure vital signs reviewed and stable Respiratory status: spontaneous breathing, nonlabored ventilation and respiratory function stable Cardiovascular status: blood pressure returned to baseline Postop Assessment: no apparent nausea or vomiting Anesthetic complications: no   No notable events documented.      Shanda Howells
